# Patient Record
Sex: Female | Born: 1985 | Race: White | Hispanic: No | Marital: Married | Smoking: Current some day smoker
Health system: Southern US, Community
[De-identification: ages and names within clinical notes are randomized; demographics above are authoritative.]

## PROBLEM LIST (undated history)

## (undated) DIAGNOSIS — R112 Nausea with vomiting, unspecified: Secondary | ICD-10-CM

## (undated) DIAGNOSIS — G8929 Other chronic pain: Secondary | ICD-10-CM

## (undated) DIAGNOSIS — T8859XA Other complications of anesthesia, initial encounter: Secondary | ICD-10-CM

## (undated) DIAGNOSIS — R51 Headache: Secondary | ICD-10-CM

## (undated) DIAGNOSIS — M329 Systemic lupus erythematosus, unspecified: Secondary | ICD-10-CM

## (undated) DIAGNOSIS — F419 Anxiety disorder, unspecified: Secondary | ICD-10-CM

## (undated) DIAGNOSIS — Z9889 Other specified postprocedural states: Secondary | ICD-10-CM

## (undated) DIAGNOSIS — Z889 Allergy status to unspecified drugs, medicaments and biological substances status: Secondary | ICD-10-CM

## (undated) DIAGNOSIS — R519 Headache, unspecified: Secondary | ICD-10-CM

## (undated) DIAGNOSIS — E0500 Thyrotoxicosis with diffuse goiter without thyrotoxic crisis or storm: Secondary | ICD-10-CM

## (undated) DIAGNOSIS — J189 Pneumonia, unspecified organism: Secondary | ICD-10-CM

## (undated) DIAGNOSIS — T4145XA Adverse effect of unspecified anesthetic, initial encounter: Secondary | ICD-10-CM

## (undated) DIAGNOSIS — E079 Disorder of thyroid, unspecified: Secondary | ICD-10-CM

## (undated) DIAGNOSIS — I499 Cardiac arrhythmia, unspecified: Secondary | ICD-10-CM

## (undated) DIAGNOSIS — M797 Fibromyalgia: Secondary | ICD-10-CM

## (undated) HISTORY — DX: Systemic lupus erythematosus, unspecified: M32.9

## (undated) HISTORY — DX: Fibromyalgia: M79.7

## (undated) HISTORY — PX: OTHER SURGICAL HISTORY: SHX169

---
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
## 2003-11-03 ENCOUNTER — Other Ambulatory Visit: Admission: RE | Admit: 2003-11-03 | Discharge: 2003-11-03 | Payer: Self-pay | Admitting: Family Medicine

## 2003-11-05 ENCOUNTER — Other Ambulatory Visit: Admission: RE | Admit: 2003-11-05 | Discharge: 2003-11-05 | Payer: Self-pay | Admitting: Family Medicine

## 2004-08-18 ENCOUNTER — Other Ambulatory Visit: Admission: RE | Admit: 2004-08-18 | Discharge: 2004-08-18 | Payer: Self-pay | Admitting: Obstetrics and Gynecology

## 2005-01-03 ENCOUNTER — Other Ambulatory Visit: Admission: RE | Admit: 2005-01-03 | Discharge: 2005-01-03 | Payer: Self-pay | Admitting: Obstetrics and Gynecology

## 2009-07-13 ENCOUNTER — Emergency Department (HOSPITAL_COMMUNITY): Admission: EM | Admit: 2009-07-13 | Discharge: 2009-07-13 | Payer: Self-pay | Admitting: Emergency Medicine

## 2011-09-29 ENCOUNTER — Other Ambulatory Visit (HOSPITAL_COMMUNITY): Payer: Self-pay | Admitting: "Endocrinology

## 2011-09-29 DIAGNOSIS — E0590 Thyrotoxicosis, unspecified without thyrotoxic crisis or storm: Secondary | ICD-10-CM

## 2011-10-03 ENCOUNTER — Encounter (HOSPITAL_COMMUNITY): Payer: Self-pay

## 2011-10-03 ENCOUNTER — Ambulatory Visit (HOSPITAL_COMMUNITY)
Admission: RE | Admit: 2011-10-03 | Discharge: 2011-10-03 | Disposition: A | Discharge status: Home / Self Care | Payer: Medicaid Other | Source: Ambulatory Visit | Attending: "Endocrinology | Admitting: "Endocrinology

## 2011-10-03 DIAGNOSIS — E0500 Thyrotoxicosis with diffuse goiter without thyrotoxic crisis or storm: Secondary | ICD-10-CM | POA: Insufficient documentation

## 2011-10-03 DIAGNOSIS — E0590 Thyrotoxicosis, unspecified without thyrotoxic crisis or storm: Secondary | ICD-10-CM

## 2011-10-03 MED ORDER — SODIUM IODIDE I 131 CAPSULE
20.0000 | Freq: Once | INTRAVENOUS | Status: AC | PRN
  Administered 2011-10-03: 22 via ORAL

## 2011-10-04 ENCOUNTER — Ambulatory Visit (HOSPITAL_COMMUNITY)
Admission: RE | Admit: 2011-10-04 | Discharge: 2011-10-04 | Disposition: A | Discharge status: Home / Self Care | Payer: Medicaid Other | Source: Ambulatory Visit | Attending: "Endocrinology | Admitting: "Endocrinology

## 2011-10-04 ENCOUNTER — Encounter (HOSPITAL_COMMUNITY): Payer: Self-pay

## 2011-10-04 MED ORDER — SODIUM PERTECHNETATE TC 99M INJECTION
10.0000 | Freq: Once | INTRAVENOUS | Status: AC | PRN
  Administered 2011-10-04: 9 via INTRAVENOUS

## 2011-10-05 ENCOUNTER — Other Ambulatory Visit (HOSPITAL_COMMUNITY): Payer: Self-pay | Admitting: "Endocrinology

## 2011-10-05 DIAGNOSIS — E0590 Thyrotoxicosis, unspecified without thyrotoxic crisis or storm: Secondary | ICD-10-CM

## 2011-10-06 ENCOUNTER — Ambulatory Visit (HOSPITAL_COMMUNITY): Payer: Self-pay

## 2011-10-18 ENCOUNTER — Encounter (HOSPITAL_COMMUNITY): Payer: Self-pay | Admitting: Respiratory Therapy

## 2011-10-19 ENCOUNTER — Emergency Department (HOSPITAL_COMMUNITY)
Admission: EM | Admit: 2011-10-19 | Discharge: 2011-10-20 | Disposition: A | Discharge status: Home / Self Care | Payer: Medicaid Other | Attending: Emergency Medicine | Admitting: Emergency Medicine

## 2011-10-19 ENCOUNTER — Encounter (HOSPITAL_COMMUNITY): Payer: Self-pay | Admitting: Emergency Medicine

## 2011-10-19 ENCOUNTER — Emergency Department (HOSPITAL_COMMUNITY): Payer: Medicaid Other

## 2011-10-19 DIAGNOSIS — R1310 Dysphagia, unspecified: Secondary | ICD-10-CM | POA: Insufficient documentation

## 2011-10-19 DIAGNOSIS — R490 Dysphonia: Secondary | ICD-10-CM | POA: Insufficient documentation

## 2011-10-19 DIAGNOSIS — R0602 Shortness of breath: Secondary | ICD-10-CM | POA: Insufficient documentation

## 2011-10-19 DIAGNOSIS — J029 Acute pharyngitis, unspecified: Secondary | ICD-10-CM | POA: Insufficient documentation

## 2011-10-19 DIAGNOSIS — M542 Cervicalgia: Secondary | ICD-10-CM | POA: Insufficient documentation

## 2011-10-19 DIAGNOSIS — E0500 Thyrotoxicosis with diffuse goiter without thyrotoxic crisis or storm: Secondary | ICD-10-CM | POA: Insufficient documentation

## 2011-10-19 DIAGNOSIS — R220 Localized swelling, mass and lump, head: Secondary | ICD-10-CM | POA: Insufficient documentation

## 2011-10-19 DIAGNOSIS — J45909 Unspecified asthma, uncomplicated: Secondary | ICD-10-CM | POA: Insufficient documentation

## 2011-10-19 HISTORY — DX: Thyrotoxicosis with diffuse goiter without thyrotoxic crisis or storm: E05.00

## 2011-10-19 HISTORY — DX: Disorder of thyroid, unspecified: E07.9

## 2011-10-19 LAB — POCT I-STAT, CHEM 8
Creatinine, Ser: 0.6 mg/dL (ref 0.50–1.10)
HCT: 38 % (ref 36.0–46.0)
Hemoglobin: 12.9 g/dL (ref 12.0–15.0)
Sodium: 140 mEq/L (ref 135–145)
TCO2: 25 mmol/L (ref 0–100)

## 2011-10-19 LAB — CBC
HCT: 36.2 % (ref 36.0–46.0)
Hemoglobin: 13.2 g/dL (ref 12.0–15.0)
MCHC: 36.5 g/dL — ABNORMAL HIGH (ref 30.0–36.0)
MCV: 78.2 fL (ref 78.0–100.0)
Platelets: 212 10*3/uL (ref 150–400)
RBC: 4.63 MIL/uL (ref 3.87–5.11)
RDW: 12.7 % (ref 11.5–15.5)
WBC: 11.4 10*3/uL — ABNORMAL HIGH (ref 4.0–10.5)

## 2011-10-19 LAB — CALCIUM: Calcium: 9.7 mg/dL (ref 8.4–10.5)

## 2011-10-19 LAB — DIFFERENTIAL
Basophils Absolute: 0 10*3/uL (ref 0.0–0.1)
Basophils Relative: 0 % (ref 0–1)
Eosinophils Absolute: 0.3 10*3/uL (ref 0.0–0.7)
Neutrophils Relative %: 54 % (ref 43–77)

## 2011-10-19 MED ORDER — SODIUM CHLORIDE 0.9 % IV BOLUS (SEPSIS)
1000.0000 mL | Freq: Once | INTRAVENOUS | Status: AC
  Administered 2011-10-19: 1000 mL via INTRAVENOUS

## 2011-10-19 MED ORDER — MORPHINE SULFATE 4 MG/ML IJ SOLN
4.0000 mg | Freq: Once | INTRAMUSCULAR | Status: AC
  Administered 2011-10-19: 4 mg via INTRAVENOUS
  Filled 2011-10-19: qty 1

## 2011-10-19 MED ORDER — ONDANSETRON HCL 4 MG/2ML IJ SOLN
4.0000 mg | Freq: Once | INTRAMUSCULAR | Status: AC
  Administered 2011-10-19: 4 mg via INTRAVENOUS
  Filled 2011-10-19: qty 2

## 2011-10-19 MED ORDER — IOHEXOL 300 MG/ML  SOLN
100.0000 mL | Freq: Once | INTRAMUSCULAR | Status: AC | PRN
  Administered 2011-10-19: 100 mL via INTRAVENOUS

## 2011-10-19 NOTE — ED Notes (Signed)
Patient with enlarged thyroid.  Having sore throat, shortness of breath.  Patient with history of Grave's Disease.  Surgery in a few weeks.

## 2011-10-19 NOTE — ED Notes (Signed)
Pt c/o sob, sore throat, and increase pain w/swallowing. Pt states "it feels like I'm swallowing glass when I swallow." Pt is scheduled to have her thyroid gland d/t graves disease, Dr. Marilynne Drivers sent pt here today d/t pain

## 2011-10-19 NOTE — ED Provider Notes (Signed)
History     CSN: [id number]  Arrival date & time 10/19/11  2026   First MD Initiated Contact with Patient 10/19/11 2217      Chief Complaint  Patient presents with  . Sore Throat  . Shortness of Breath    (Consider location/radiation/quality/duration/timing/severity/associated sxs/prior treatment) HPI Comments: Valerie Ayala is a 26 year old female, who is scheduled for a thyroidectomy, do, to severe Graves' disease, now reports 3 days of increasing visible neck swelling, difficulty swallowing, hoarseness, and shortness of breath.  She spoke with her doctor, Dr. Cleon Gustin- recommend she come to the emergency room for further evaluation  Patient is a 26 y.o. female presenting with pharyngitis and shortness of breath. The history is provided by the patient.  Sore Throat This is a new problem. The current episode started in the past 7 days. The problem occurs constantly. The problem has been rapidly worsening. Associated symptoms include neck pain and a sore throat. Pertinent negatives include no chills or fever.  Shortness of Breath  Associated symptoms include sore throat and shortness of breath. Pertinent negatives include no fever.    Past Medical History  Diagnosis Date  . Asthma   . Thyroid disease   . Grave's disease     History reviewed. No pertinent past surgical history.  No family history on file.  History  Substance Use Topics  . Smoking status: Former Games developer  . Smokeless tobacco: Not on file  . Alcohol Use: No    OB History    Grav Para Term Preterm Abortions TAB SAB Ect Mult Living                  Review of Systems  Constitutional: Negative for fever and chills.  HENT: Positive for sore throat, trouble swallowing, neck pain and voice change. Negative for facial swelling and neck stiffness.   Respiratory: Positive for shortness of breath.     Allergies  Review of patient's allergies indicates no known allergies.  Home Medications   Current Outpatient Rx    Name Route Sig Dispense Refill  . ALBUTEROL SULFATE HFA 108 (90 BASE) MCG/ACT IN AERS Inhalation Inhale 2 puffs into the lungs every 6 (six) hours as needed. For shortness of breath    . ALPRAZOLAM 0.5 MG PO TABS Oral Take 0.5 mg by mouth 2 (two) times daily as needed. For anxiety    . IBUPROFEN 200 MG PO TABS Oral Take 400 mg by mouth every 6 (six) hours as needed. For pain    . METHIMAZOLE 5 MG PO TABS Oral Take 5 mg by mouth daily.    Marland Kitchen METOPROLOL SUCCINATE ER 50 MG PO TB24 Oral Take 50 mg by mouth daily. Take with or immediately following a meal.    . AZITHROMYCIN 250 MG PO TABS  1 tablet daily 4 each 0  . AZITHROMYCIN 250 MG PO TABS Oral Take 1 tablet (250 mg total) by mouth daily. 4 tablet 0  . HYDROCODONE-ACETAMINOPHEN 5-325 MG PO TABS Oral Take 1 tablet by mouth once. 20 tablet 0    BP 124/62  Pulse 95  Temp(Src) 98.8 F (37.1 C) (Oral)  Resp 20  SpO2 100%  LMP 10/03/2011  Physical Exam  Constitutional: She is oriented to person, place, and time. She appears well-developed and well-nourished.  HENT:  Head: Normocephalic.  Eyes: Pupils are equal, round, and reactive to light.  Neck: No JVD present. No tracheal deviation present. Thyromegaly present.  Cardiovascular: Normal rate and regular rhythm.   Pulmonary/Chest: Effort  normal. No stridor.  Musculoskeletal: Normal range of motion.  Lymphadenopathy:    She has no cervical adenopathy.  Neurological: She is alert and oriented to person, place, and time.  Skin: Skin is warm and dry.    ED Course  Procedures (including critical care time)  Labs Reviewed  CBC - Abnormal; Notable for the following:    WBC 11.4 (*)    MCHC 36.5 (*)    All other components within normal limits  DIFFERENTIAL - Abnormal; Notable for the following:    Lymphs Abs 4.1 (*)    All other components within normal limits  CALCIUM  POCT I-STAT, CHEM 8  RAPID STREP SCREEN   Ct Soft Tissue Neck W Contrast  10/19/2011  *RADIOLOGY REPORT*   Clinical Data: Swollen neck.  Difficulty swallowing.  Unable to follow.  Sore throat.  CT NECK WITH CONTRAST  Technique:  Multidetector CT imaging of the neck was performed with intravenous contrast.  Contrast: OMNIPAQUE IOHEXOL 300 MG/ML  SOLN  Comparison: None.  Findings: Diffuse thyroid enlargement with homogeneous parenchymal pattern.  Cervical airways appear patent.  Moderately prominent size of the tonsils bilaterally suggesting possible inflammatory process.  No focal tonsillar or peritonsillar abscess.  Mucosal spaces appear intact.  Cervical carotid and jugular vessels appear patent.  No displacement of cervical fat planes.  No prevertebral soft tissue swelling.  Prominent cervical lymph nodes diffusely consistent with reactive nodes.  Salivary glands appear symmetrical.  Visualized bones appear intact.  No destructive bone lesions appreciated.  Mucosal membrane thickening in the maxillary antra with air-fluid level in the right maxillary antrum and opacification of the ethmoid air cells and inferior frontal sinuses, likely due to sinusitis.  IMPRESSION: Diffuse homogeneous enlargement of the thyroid without apparent airway compromise.  Inflammatory changes in the paranasal sinuses. Diffuse swelling of the tonsils suggesting inflammatory process without focal abscess.  Original Report Authenticated By: Marlon Pel, M.D.     1. Pharyngitis       MDM  Concern for airway compromise discuss this case with Dr. Emily Filbert , who agrees the patient needs a CT scan also added.  CBC i-STAT, and calcium levels        Arman Filter, NP 10/20/11 [phone]

## 2011-10-19 NOTE — ED Notes (Addendum)
Alert, NAD, calm, interactive, back from b/r, ambulatory, steady gait, friend at [facility], c/o throat, head and ear pain. Pain med given. Pt talkative with hoarse voice.

## 2011-10-20 LAB — RAPID STREP SCREEN (MED CTR MEBANE ONLY): Streptococcus, Group A Screen (Direct): NEGATIVE

## 2011-10-20 MED ORDER — AZITHROMYCIN 250 MG PO TABS: ORAL_TABLET | ORAL | Status: AC

## 2011-10-20 MED ORDER — AZITHROMYCIN 250 MG PO TABS: 250.0000 mg | ORAL_TABLET | Freq: Every day | ORAL | Status: AC

## 2011-10-20 MED ORDER — HYDROCODONE-ACETAMINOPHEN 5-325 MG PO TABS
1.0000 | ORAL_TABLET | Freq: Once | ORAL | Status: AC
  Administered 2011-10-20: 1 via ORAL
  Filled 2011-10-20: qty 1

## 2011-10-20 MED ORDER — AZITHROMYCIN 250 MG PO TABS
500.0000 mg | ORAL_TABLET | Freq: Once | ORAL | Status: AC
  Administered 2011-10-20: 500 mg via ORAL
  Filled 2011-10-20: qty 2

## 2011-10-20 MED ORDER — HYDROCODONE-ACETAMINOPHEN 5-325 MG PO TABS: 1.0000 | ORAL_TABLET | Freq: Once | ORAL | Status: AC

## 2011-10-20 NOTE — Discharge Instructions (Signed)
Pharyngitis, Viral and Bacterial Pharyngitis is soreness (inflammation) or infection of the pharynx. It is also called a sore throat. CAUSES  Most sore throats are caused by viruses and are part of a cold. However, some sore throats are caused by strep and other bacteria. Sore throats can also be caused by post nasal drip from draining sinuses, allergies and sometimes from sleeping with an open mouth. Infectious sore throats can be spread from person to person by coughing, sneezing and sharing cups or eating utensils. TREATMENT  Sore throats that are viral usually last 3-4 days. Viral illness will get better without medications (antibiotics). Strep throat and other bacterial infections will usually begin to get better about 24-48 hours after you begin to take antibiotics. HOME CARE INSTRUCTIONS   If the caregiver feels there is a bacterial infection or if there is a positive strep test, they will prescribe an antibiotic. The full course of antibiotics must be taken. If the full course of antibiotic is not taken, you or your child may become ill again. If you or your child has strep throat and do not finish all of the medication, serious heart or kidney diseases may develop.   Drink enough water and fluids to keep your urine clear or pale yellow.   Only take over-the-counter or prescription medicines for pain, discomfort or fever as directed by your caregiver.   Get lots of rest.   Gargle with salt water ( tsp. of salt in a glass of water) as often as every 1-2 hours as you need for comfort.   Hard candies may soothe the throat if individual is not at risk for choking. Throat sprays or lozenges may also be used.  SEEK MEDICAL CARE IF:   Large, tender lumps in the neck develop.   A rash develops.   Green, yellow-brown or bloody sputum is coughed up.   Your baby is older than 3 months with a rectal temperature of 100.5 F (38.1 C) or higher for more than 1 day.  SEEK IMMEDIATE MEDICAL CARE  IF:   A stiff neck develops.   You or your child are drooling or unable to swallow liquids.   You or your child are vomiting, unable to keep medications or liquids down.   You or your child has severe pain, unrelieved with recommended medications.   You or your child are having difficulty breathing (not due to stuffy nose).   You or your child are unable to fully open your mouth.   You or your child develop redness, swelling, or severe pain anywhere on the neck.   You have a fever.   Your baby is older than 3 months with a rectal temperature of 102 F (38.9 C) or higher.   Your baby is 71 months old or younger with a rectal temperature of 100.4 F (38 C) or higher.  MAKE SURE YOU:   Understand these instructions.   Will watch your condition.   Will get help right away if you are not doing well or get worse.  Document Released: 06/13/2005 Document Revised: 06/02/2011 Document Reviewed: 09/10/2007 [facility] Patient Information 2012 [place], [place]. Today's CT scan is normal.  The size of your thyroid has not changed her airway is intact.  He did have some increased/inflamed tonsillar tissue with a negative strep test are being treated with an antibiotic called azithromycin and you have been given Vicodin for pain.  Please call your endocrinologist tomorrow to let him know you were in the emergency room  she can proceed with her surgery

## 2011-10-20 NOTE — ED Notes (Signed)
Up to b/r, steady gait, talkative, alert, NAD, calm.

## 2011-10-23 NOTE — ED Provider Notes (Signed)
Medical screening examination/treatment/procedure(s) were performed by non-physician practitioner and as supervising physician I was immediately available for consultation/collaboration.  Shironda Kain, MD 10/23/11 1703 

## 2011-10-27 NOTE — H&P (Signed)
  Valerie Ayala is an 26 y.o. female.   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  Past Medical History  Diagnosis Date  . Asthma   . Thyroid disease   . Grave's disease     No past surgical history on file.  No family history on file. Social History:  reports that she has quit smoking. She does not have any smokeless tobacco history on file. She reports that she does not drink alcohol or use illicit drugs.  Allergies: No Known Allergies  No prescriptions prior to admission    No results found for this or any previous visit (from the past 48 hour(s)). No results found.  ROS: otherwise negative  There were no vitals taken for this visit.  PHYSICAL EXAM: Overall appearance:  Healthy appearing, in no distress Head:  Normocephalic, atraumatic. Ears: External auditory canals are clear; tympanic membranes are intact and the middle ears are free of any effusion. Nose: External nose is healthy in appearance. Internal nasal exam free of any lesions or obstruction. Oral Cavity:  There are no mucosal lesions or masses identified. Oral Pharynx/Hypopharynx/Larynx: no signs of any mucosal lesions or masses identified. Vocal cords move normally. Neuro:  No identifiable neurologic deficits. Neck: No palpable neck masses. Thyroid mildly enlarged, slightly tender.  Studies Reviewed: none    Assessment/Plan Recommend total thyroidectomy.  Nixie Laube 10/27/2011, 1:00 PM

## 2011-10-27 NOTE — Pre-Procedure Instructions (Signed)
20 Valerie Ayala  10/27/2011   Your procedure is scheduled on:  Nov 02, 2011 @ 1000  Report to [facility] at 0800 AM.  Call this number if you have problems the morning of surgery: [phone]   Remember:   Do not eat food:After Midnight.  May have clear liquids: up to 4 Hours before arrival.  Clear liquids include soda, tea, black coffee, apple or grape juice, broth.  Take these medicines the morning of surgery with A SIP OF WATER: Inhalers (if needed), xanax, Norco (if needed), Toprol   Do not wear jewelry, make-up or nail polish.  Do not wear lotions, powders, or perfumes.   Do not shave 48 hours prior to surgery.  Do not bring valuables to the hospital.  Contacts, dentures or bridgework may not be worn into surgery.  Leave suitcase in the car. After surgery it may be brought to your room.  For patients admitted to the hospital, checkout time is 11:00 AM the day of discharge.   Patients discharged the day of surgery will not be allowed to drive home.  Special Instructions: CHG Shower Use Special Wash: 1/2 bottle night before surgery and 1/2 bottle morning of surgery.   Please read over the following fact sheets that you were given: Pain Booklet, Coughing and Deep Breathing, MRSA Information and Surgical Site Infection Prevention

## 2011-10-28 ENCOUNTER — Ambulatory Visit (HOSPITAL_COMMUNITY)
Admission: RE | Admit: 2011-10-28 | Discharge: 2011-10-28 | Disposition: A | Discharge status: Home / Self Care | Payer: Medicaid Other | Source: Ambulatory Visit | Attending: Anesthesiology | Admitting: Anesthesiology

## 2011-10-28 ENCOUNTER — Encounter (HOSPITAL_COMMUNITY)
Admission: RE | Admit: 2011-10-28 | Discharge: 2011-10-28 | Disposition: A | Discharge status: Home / Self Care | Payer: Medicaid Other | Source: Ambulatory Visit | Attending: Otolaryngology | Admitting: Otolaryngology

## 2011-10-28 ENCOUNTER — Encounter (HOSPITAL_COMMUNITY): Payer: Self-pay

## 2011-10-28 DIAGNOSIS — Z01818 Encounter for other preprocedural examination: Secondary | ICD-10-CM | POA: Insufficient documentation

## 2011-10-28 DIAGNOSIS — Z01812 Encounter for preprocedural laboratory examination: Secondary | ICD-10-CM | POA: Insufficient documentation

## 2011-10-28 HISTORY — DX: Headache: R51

## 2011-10-28 HISTORY — DX: Cardiac arrhythmia, unspecified: I49.9

## 2011-10-28 HISTORY — DX: Allergy status to unspecified drugs, medicaments and biological substances: Z88.9

## 2011-10-28 HISTORY — DX: Anxiety disorder, unspecified: F41.9

## 2011-10-28 LAB — HCG, SERUM, QUALITATIVE: Preg, Serum: NEGATIVE

## 2011-10-28 LAB — CBC
Hemoglobin: 13.5 g/dL (ref 12.0–15.0)
MCH: 28.3 pg (ref 26.0–34.0)
MCHC: 36.1 g/dL — ABNORMAL HIGH (ref 30.0–36.0)

## 2011-10-28 LAB — SURGICAL PCR SCREEN: MRSA, PCR: NEGATIVE

## 2011-10-28 NOTE — Progress Notes (Signed)
Primary Physician - Dr. Margo Aye [facility] Patient does not have cardiologist. Has not had EKG, Echo, Stress test or cardiac Cath

## 2011-11-01 MED ORDER — CEFAZOLIN SODIUM-DEXTROSE 2-3 GM-% IV SOLR
2.0000 g | INTRAVENOUS | Status: AC
  Administered 2011-11-02: 2 g via INTRAVENOUS
  Filled 2011-11-01 (×2): qty 50

## 2011-11-02 ENCOUNTER — Encounter (HOSPITAL_COMMUNITY): Payer: Self-pay | Admitting: *Deleted

## 2011-11-02 ENCOUNTER — Encounter (HOSPITAL_COMMUNITY): Payer: Self-pay | Admitting: Anesthesiology

## 2011-11-02 ENCOUNTER — Ambulatory Visit (HOSPITAL_COMMUNITY)
Admission: RE | Admit: 2011-11-02 | Discharge: 2011-11-03 | Disposition: A | Discharge status: Home / Self Care | Payer: Medicaid Other | Source: Ambulatory Visit | Attending: Otolaryngology | Admitting: Otolaryngology

## 2011-11-02 ENCOUNTER — Encounter (HOSPITAL_COMMUNITY)
Admission: RE | Disposition: A | Discharge status: Home / Self Care | Payer: Self-pay | Source: Ambulatory Visit | Attending: Otolaryngology

## 2011-11-02 ENCOUNTER — Ambulatory Visit (HOSPITAL_COMMUNITY): Payer: Medicaid Other | Admitting: Anesthesiology

## 2011-11-02 DIAGNOSIS — J189 Pneumonia, unspecified organism: Secondary | ICD-10-CM

## 2011-11-02 DIAGNOSIS — E0500 Thyrotoxicosis with diffuse goiter without thyrotoxic crisis or storm: Secondary | ICD-10-CM | POA: Insufficient documentation

## 2011-11-02 DIAGNOSIS — Z23 Encounter for immunization: Secondary | ICD-10-CM | POA: Insufficient documentation

## 2011-11-02 HISTORY — DX: Other chronic pain: G89.29

## 2011-11-02 HISTORY — DX: Adverse effect of unspecified anesthetic, initial encounter: T41.45XA

## 2011-11-02 HISTORY — DX: Headache, unspecified: R51.9

## 2011-11-02 HISTORY — DX: Other complications of anesthesia, initial encounter: T88.59XA

## 2011-11-02 HISTORY — PX: TOTAL THYROIDECTOMY: SHX2547

## 2011-11-02 HISTORY — DX: Other specified postprocedural states: R11.2

## 2011-11-02 HISTORY — DX: Pneumonia, unspecified organism: J18.9

## 2011-11-02 HISTORY — DX: Headache: R51

## 2011-11-02 HISTORY — PX: THYROIDECTOMY: SHX17

## 2011-11-02 HISTORY — DX: Other specified postprocedural states: Z98.890

## 2011-11-02 LAB — CALCIUM
Calcium: 8.5 mg/dL (ref 8.4–10.5)
Calcium: 8.3 mg/dL — ABNORMAL LOW (ref 8.4–10.5)

## 2011-11-02 SURGERY — THYROIDECTOMY
Anesthesia: General | Site: Neck | Laterality: Bilateral | Wound class: Clean

## 2011-11-02 MED ORDER — METHIMAZOLE 5 MG PO TABS
5.0000 mg | ORAL_TABLET | Freq: Every day | ORAL | Status: DC
  Administered 2011-11-03: 5 mg via ORAL
  Filled 2011-11-02 (×2): qty 1

## 2011-11-02 MED ORDER — PROMETHAZINE HCL 25 MG PO TABS
25.0000 mg | ORAL_TABLET | Freq: Four times a day (QID) | ORAL | Status: DC | PRN
  Administered 2011-11-02: 25 mg via ORAL
  Filled 2011-11-02: qty 1

## 2011-11-02 MED ORDER — DEXTROSE 5 % IV SOLN
INTRAVENOUS | Status: DC | PRN
  Administered 2011-11-02: 11:00:00 via INTRAVENOUS

## 2011-11-02 MED ORDER — DEXTROSE-NACL 5-0.9 % IV SOLN
INTRAVENOUS | Status: DC
  Administered 2011-11-02 – 2011-11-03 (×2): via INTRAVENOUS

## 2011-11-02 MED ORDER — LACTATED RINGERS IV SOLN: INTRAVENOUS | Status: DC

## 2011-11-02 MED ORDER — ONDANSETRON HCL 4 MG/2ML IJ SOLN
INTRAMUSCULAR | Status: DC | PRN
  Administered 2011-11-02: 4 mg via INTRAVENOUS

## 2011-11-02 MED ORDER — MORPHINE SULFATE 2 MG/ML IJ SOLN
2.0000 mg | INTRAMUSCULAR | Status: DC | PRN
  Administered 2011-11-02: 2 mg via INTRAVENOUS
  Filled 2011-11-02: qty 1

## 2011-11-02 MED ORDER — HYDROCODONE-ACETAMINOPHEN 7.5-500 MG PO TABS: 1.0000 | ORAL_TABLET | Freq: Four times a day (QID) | ORAL | Status: AC | PRN

## 2011-11-02 MED ORDER — PNEUMOCOCCAL VAC POLYVALENT 25 MCG/0.5ML IJ INJ
0.5000 mL | INJECTION | INTRAMUSCULAR | Status: AC
  Administered 2011-11-03: 0.5 mL via INTRAMUSCULAR
  Filled 2011-11-02: qty 0.5

## 2011-11-02 MED ORDER — LACTATED RINGERS IV SOLN
INTRAVENOUS | Status: DC
  Administered 2011-11-02: 10:00:00 via INTRAVENOUS

## 2011-11-02 MED ORDER — 0.9 % SODIUM CHLORIDE (POUR BTL) OPTIME
TOPICAL | Status: DC | PRN
  Administered 2011-11-02: 1000 mL

## 2011-11-02 MED ORDER — HYDROMORPHONE HCL PF 1 MG/ML IJ SOLN
0.2500 mg | INTRAMUSCULAR | Status: DC | PRN
  Administered 2011-11-02 (×2): 0.5 mg via INTRAVENOUS

## 2011-11-02 MED ORDER — SUCCINYLCHOLINE CHLORIDE 20 MG/ML IJ SOLN
INTRAMUSCULAR | Status: DC | PRN
  Administered 2011-11-02: 100 mg via INTRAVENOUS

## 2011-11-02 MED ORDER — LIDOCAINE HCL 4 % MT SOLN
OROMUCOSAL | Status: DC | PRN
  Administered 2011-11-02: 4 mL via TOPICAL

## 2011-11-02 MED ORDER — LACTATED RINGERS IV SOLN
INTRAVENOUS | Status: DC | PRN
  Administered 2011-11-02 (×2): via INTRAVENOUS

## 2011-11-02 MED ORDER — IBUPROFEN 400 MG PO TABS
400.0000 mg | ORAL_TABLET | Freq: Four times a day (QID) | ORAL | Status: DC | PRN
  Filled 2011-11-02: qty 1

## 2011-11-02 MED ORDER — PROMETHAZINE HCL 25 MG RE SUPP: 25.0000 mg | Freq: Four times a day (QID) | RECTAL | Status: DC | PRN

## 2011-11-02 MED ORDER — METOPROLOL SUCCINATE ER 50 MG PO TB24
50.0000 mg | ORAL_TABLET | Freq: Every day | ORAL | Status: DC
  Administered 2011-11-03: 50 mg via ORAL
  Filled 2011-11-02: qty 1

## 2011-11-02 MED ORDER — ALPRAZOLAM 0.5 MG PO TABS: 0.5000 mg | ORAL_TABLET | Freq: Two times a day (BID) | ORAL | Status: DC | PRN

## 2011-11-02 MED ORDER — DEXAMETHASONE SODIUM PHOSPHATE 4 MG/ML IJ SOLN
INTRAMUSCULAR | Status: DC | PRN
  Administered 2011-11-02: 8 mg via INTRAVENOUS

## 2011-11-02 MED ORDER — ALBUTEROL SULFATE HFA 108 (90 BASE) MCG/ACT IN AERS
2.0000 | INHALATION_SPRAY | Freq: Four times a day (QID) | RESPIRATORY_TRACT | Status: DC | PRN
  Filled 2011-11-02: qty 6.7

## 2011-11-02 MED ORDER — HETASTARCH-ELECTROLYTES 6 % IV SOLN
INTRAVENOUS | Status: DC | PRN
  Administered 2011-11-02: 11:00:00 via INTRAVENOUS

## 2011-11-02 MED ORDER — SUFENTANIL CITRATE 50 MCG/ML IV SOLN
INTRAVENOUS | Status: DC | PRN
  Administered 2011-11-02: 10 ug via INTRAVENOUS
  Administered 2011-11-02 (×2): 5 ug via INTRAVENOUS
  Administered 2011-11-02: 10 ug via INTRAVENOUS
  Administered 2011-11-02 (×4): 5 ug via INTRAVENOUS

## 2011-11-02 MED ORDER — MORPHINE SULFATE 4 MG/ML IJ SOLN: 0.0500 mg/kg | INTRAMUSCULAR | Status: DC | PRN

## 2011-11-02 MED ORDER — MIDAZOLAM HCL 5 MG/5ML IJ SOLN
INTRAMUSCULAR | Status: DC | PRN
  Administered 2011-11-02: 2 mg via INTRAVENOUS

## 2011-11-02 MED ORDER — METOCLOPRAMIDE HCL 5 MG/ML IJ SOLN
10.0000 mg | INTRAMUSCULAR | Status: AC
  Administered 2011-11-02: 10 mg via INTRAVENOUS
  Filled 2011-11-02: qty 2

## 2011-11-02 MED ORDER — TRAMADOL HCL 50 MG PO TABS: 50.0000 mg | ORAL_TABLET | Freq: Four times a day (QID) | ORAL | Status: DC | PRN

## 2011-11-02 MED ORDER — LIDOCAINE-EPINEPHRINE 1 %-1:100000 IJ SOLN
INTRAMUSCULAR | Status: DC | PRN
  Administered 2011-11-02: 20 mL

## 2011-11-02 MED ORDER — TRAMADOL HCL 50 MG PO TABS: 50.0000 mg | ORAL_TABLET | Freq: Four times a day (QID) | ORAL | Status: AC | PRN

## 2011-11-02 MED ORDER — HYDROCODONE-ACETAMINOPHEN 5-325 MG PO TABS
1.0000 | ORAL_TABLET | ORAL | Status: DC | PRN
  Filled 2011-11-02: qty 2

## 2011-11-02 MED ORDER — HYDROCODONE-ACETAMINOPHEN 7.5-500 MG/15ML PO SOLN
10.0000 mL | ORAL | Status: DC | PRN
  Administered 2011-11-02: 15 mL via ORAL
  Administered 2011-11-03: 20 mL via ORAL
  Filled 2011-11-02 (×2): qty 30

## 2011-11-02 MED ORDER — PROPOFOL 10 MG/ML IV EMUL
INTRAVENOUS | Status: DC | PRN
  Administered 2011-11-02: 180 mg via INTRAVENOUS

## 2011-11-02 SURGICAL SUPPLY — 48 items
APPLIER CLIP 9.375 SM OPEN (CLIP)
ATTRACTOMAT 16X20 MAGNETIC DRP (DRAPES) IMPLANT
CANISTER SUCTION 2500CC (MISCELLANEOUS) ×2 IMPLANT
CLEANER TIP ELECTROSURG 2X2 (MISCELLANEOUS) ×2 IMPLANT
CLIP APPLIE 9.375 SM OPEN (CLIP) IMPLANT
CLOTH BEACON ORANGE TIMEOUT ST (SAFETY) ×2 IMPLANT
CONT SPEC 4OZ CLIKSEAL STRL BL (MISCELLANEOUS) ×2 IMPLANT
CORDS BIPOLAR (ELECTRODE) ×2 IMPLANT
COVER SURGICAL LIGHT HANDLE (MISCELLANEOUS) ×2 IMPLANT
DECANTER SPIKE VIAL GLASS SM (MISCELLANEOUS) ×2 IMPLANT
DERMABOND ADVANCED (GAUZE/BANDAGES/DRESSINGS) ×2
DERMABOND ADVANCED .7 DNX12 (GAUZE/BANDAGES/DRESSINGS) ×2 IMPLANT
DRAIN SNY 10 ROU (WOUND CARE) ×2 IMPLANT
ELECT COATED BLADE 2.86 ST (ELECTRODE) ×2 IMPLANT
ELECT REM PT RETURN 9FT ADLT (ELECTROSURGICAL) ×2
ELECTRODE REM PT RTRN 9FT ADLT (ELECTROSURGICAL) ×1 IMPLANT
EVACUATOR SILICONE 100CC (DRAIN) ×2 IMPLANT
GAUZE SPONGE 4X4 16PLY XRAY LF (GAUZE/BANDAGES/DRESSINGS) ×6 IMPLANT
GLOVE BIO SURGEON STRL SZ 6.5 (GLOVE) ×4 IMPLANT
GLOVE BIO SURGEON STRL SZ8 (GLOVE) ×2 IMPLANT
GLOVE BIOGEL PI IND STRL 6.5 (GLOVE) ×1 IMPLANT
GLOVE BIOGEL PI IND STRL 8 (GLOVE) ×1 IMPLANT
GLOVE BIOGEL PI INDICATOR 6.5 (GLOVE) ×1
GLOVE BIOGEL PI INDICATOR 8 (GLOVE) ×1
GLOVE ECLIPSE 7.5 STRL STRAW (GLOVE) ×2 IMPLANT
GLOVE EXAM NITRILE LRG STRL (GLOVE) ×2 IMPLANT
GOWN PREVENTION PLUS XLARGE (GOWN DISPOSABLE) ×2 IMPLANT
GOWN STRL NON-REIN LRG LVL3 (GOWN DISPOSABLE) ×6 IMPLANT
KIT BASIN OR (CUSTOM PROCEDURE TRAY) ×2 IMPLANT
KIT ROOM TURNOVER OR (KITS) ×2 IMPLANT
NEEDLE 27GAX1X1/2 (NEEDLE) ×2 IMPLANT
NS IRRIG 1000ML POUR BTL (IV SOLUTION) ×2 IMPLANT
PAD ARMBOARD 7.5X6 YLW CONV (MISCELLANEOUS) ×4 IMPLANT
PENCIL FOOT CONTROL (ELECTRODE) ×2 IMPLANT
SPECIMEN JAR MEDIUM (MISCELLANEOUS) ×2 IMPLANT
SPONGE INTESTINAL PEANUT (DISPOSABLE) IMPLANT
STAPLER VISISTAT 35W (STAPLE) ×2 IMPLANT
SUT CHROMIC 3 0 SH 27 (SUTURE) ×2 IMPLANT
SUT CHROMIC 4 0 PS 2 18 (SUTURE) ×4 IMPLANT
SUT ETHILON 3 0 PS 1 (SUTURE) ×2 IMPLANT
SUT ETHILON 5 0 P 3 18 (SUTURE) ×2
SUT NYLON ETHILON 5-0 P-3 1X18 (SUTURE) ×2 IMPLANT
SUT SILK 3 0 REEL (SUTURE) IMPLANT
SUT SILK 4 0 REEL (SUTURE) ×4 IMPLANT
TOWEL OR 17X24 6PK STRL BLUE (TOWEL DISPOSABLE) ×2 IMPLANT
TOWEL OR 17X26 10 PK STRL BLUE (TOWEL DISPOSABLE) ×2 IMPLANT
TRAY ENT MC OR (CUSTOM PROCEDURE TRAY) ×2 IMPLANT
WATER STERILE IRR 1000ML POUR (IV SOLUTION) ×2 IMPLANT

## 2011-11-02 NOTE — Anesthesia Procedure Notes (Signed)
Procedure Name: Intubation Date/Time: 11/02/2011 10:27 AM Performed by: Mekayla Soman S Pre-anesthesia Checklist: Patient identified, Emergency Drugs available, Suction available, Patient being monitored and Timeout performed Patient Re-evaluated:Patient Re-evaluated prior to inductionOxygen Delivery Method: Circle system utilized Preoxygenation: Pre-oxygenation with 100% oxygen Intubation Type: IV induction Ventilation: Mask ventilation without difficulty Laryngoscope Size: Mac and 3 Grade View: Grade I Tube type: Oral Tube size: 7.5 mm Number of attempts: 1 Airway Equipment and Method: Stylet Placement Confirmation: ETT inserted through vocal cords under direct vision,  positive ETCO2 and breath sounds checked- equal and bilateral Secured at: 22 cm Tube secured with: Tape Dental Injury: Teeth and Oropharynx as per pre-operative assessment

## 2011-11-02 NOTE — Transfer of Care (Signed)
Immediate Anesthesia Transfer of Care Note  Patient: Valerie Ayala  Procedure(s) Performed: Procedure(s) (LRB): THYROIDECTOMY (Bilateral)  Patient Location: PACU  Anesthesia Type: General  Level of Consciousness: awake, alert  and oriented  Airway & Oxygen Therapy: Patient Spontanous Breathing and Patient connected to nasal cannula oxygen  Post-op Assessment: Report given to PACU RN and Post -op Vital signs reviewed and stable  Post vital signs: Reviewed and stable  Complications: No apparent anesthesia complications

## 2011-11-02 NOTE — Anesthesia Preprocedure Evaluation (Addendum)
Anesthesia Evaluation  Patient identified by MRN, date of birth, ID band Patient awake    Reviewed: Allergy & Precautions, H&P , NPO status , Patient's Chart, lab work & pertinent test results, reviewed documented beta blocker date and time   Airway Mallampati: I  Neck ROM: Full    Dental  (+) Teeth Intact and Dental Advisory Given   Pulmonary asthma ,  breath sounds clear to auscultation        Cardiovascular negative cardio ROS  + dysrhythmias Rhythm:Regular Rate:Normal     Neuro/Psych  Headaches, Anxiety    GI/Hepatic negative GI ROS, Neg liver ROS,   Endo/Other  Hyperthyroidism   Renal/GU negative Renal ROS     Musculoskeletal negative musculoskeletal ROS (+)   Abdominal   Peds  Hematology negative hematology ROS (+)   Anesthesia Other Findings   Reproductive/Obstetrics negative OB ROS                        Anesthesia Physical Anesthesia Plan  ASA: II  Anesthesia Plan: General   Post-op Pain Management:    Induction: Intravenous  Airway Management Planned: Oral ETT  Additional Equipment:   Intra-op Plan:   Post-operative Plan: Extubation in OR  Informed Consent: I have reviewed the patients History and Physical, chart, labs and discussed the procedure including the risks, benefits and alternatives for the proposed anesthesia with the patient or authorized representative who has indicated his/her understanding and acceptance.   Dental advisory given  Plan Discussed with: CRNA, Anesthesiologist and Surgeon  Anesthesia Plan Comments:        Anesthesia Quick Evaluation

## 2011-11-02 NOTE — Progress Notes (Signed)
   ENT Progress Note:  s/p Procedure(s): THYROIDECTOMY   Subjective: Pt c/o mild nausea  Objective: Vital signs in last 24 hours: Temp:  [97.9 F (36.6 C)-99.1 F (37.3 C)] 98 F (36.7 C) (05/08 1502) Pulse Rate:  [66-134] 109  (05/08 1502) Resp:  [14-24] 20  (05/08 1502) BP: (126-140)/(64-74) 131/73 mmHg (05/08 1502) SpO2:  [97 %-100 %] 99 % (05/08 1502) Weight change:     Intake/Output from previous day:   Intake/Output this shift: Total I/O In: 1550 [I.V.:1050; IV Piggyback:500] Out: 70 [Drains:20; Blood:50]  Labs:  Studies/Results: No results found.   PHYSICAL EXAM: Inc intact, no swelling JP min   Assessment/Plan: Cont postop obs. Monitor drain output and Ca+2 levels    Valerie Ayala 11/02/2011, 4:34 PM

## 2011-11-02 NOTE — Discharge Instructions (Addendum)
You may shower, and it is okay to get the incision wet and soapy. Do not use any cream or ointment.  The prescription for calcitriol has been called in to your pharmacy.

## 2011-11-02 NOTE — Preoperative (Signed)
Beta Blockers   Reason not to administer Beta Blockers:Not Applicable, taken this am per patient 

## 2011-11-02 NOTE — Op Note (Signed)
OPERATIVE REPORT  DATE OF SURGERY: 11/02/2011  PATIENT:  Valerie Ayala,  26 y.o. female  PRE-OPERATIVE DIAGNOSIS:  Graves disease   POST-OPERATIVE DIAGNOSIS:  Graves disease   PROCEDURE:  Procedure(s): THYROIDECTOMY  SURGEON:  Susy Frizzle, MD  ASSISTANTS: Beckey Rutter, PA   ANESTHESIA:   general  EBL:  50 ml  DRAINS: (10 [place]) Jackson-Pratt drain(s) with closed bulb suction in the Neck   LOCAL MEDICATIONS USED:  NONE  SPECIMEN:  Source of Specimen:  Total thyroidectomy  COUNTS:  YES  PROCEDURE DETAILS: Patient was taken to the operating room and placed on the operating table in the supine position. All induction of general endotracheal anesthesia, the neck was prepped and draped in a standard fashion. A shoulder roll was placed beneath the shoulder blades and the neck was extended . A low transverse incision was outlined in a skin crease with a marking pen. Centimeter length was used. Electrocautery was used to incise the skin and subcutaneous tissue. The platysma was divided. Subplatysmal flaps were elevated superiorly to the thyroid notch and inferiorly to the clavicle. A self-retaining thyroid retractor was used throughout the case. The midline fascia was divided using electrocautery. The thyroid gland was identified. The left side was dissected first. The strap muscles were reflected laterally off the gland towards the left. There was some stickiness of the strap muscles to the capsule of the gland. The gland was reflected medially as the dissection was carried around the gland starting at the upper pole. Vascular structures were identified, ligated between clamps and divided. 4-0 silk ties were used throughout the case. As the superior pole was brought down inferiorly , the middle thyroid vein was then ligated and divided.  Inferior pole vessels were treated in a similar fashion. The recurrent nerve was identified and followed all the way up towards its insertion into the  larynx. It was preserved. A parathyroid was not identified on the left. On the right side a similar dissection was accomplished, and a possible parathyroid was identified superiorly. This was preserved with its blood supply. The recurrent nerve was identified on the right side as well and preserved. No other nodes or masses were identified. 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 The specimen was sent as a total thyroidectomy with a suture marking the left lobe. After hemostasis was achieved, the drain was sutured into position, the midline fascia was reapproximated with chromic suture. The platysma layer and subcuticular layer were also reapproximated with chromic suture. Dermabond was used on the skin.   PATIENT DISPOSITION:  PACU - hemodynamically stable.

## 2011-11-02 NOTE — Anesthesia Postprocedure Evaluation (Signed)
  Anesthesia Post-op Note  Patient: Valerie Ayala  Procedure(s) Performed: Procedure(s) (LRB): THYROIDECTOMY (Bilateral)  Patient Location: PACU  Anesthesia Type: General  Level of Consciousness: awake  Airway and Oxygen Therapy: Patient Spontanous Breathing  Post-op Pain: mild  Post-op Assessment: Post-op Vital signs reviewed  Post-op Vital Signs: Reviewed  Complications: No apparent anesthesia complications

## 2011-11-02 NOTE — Interval H&P Note (Signed)
History and Physical Interval Note:  11/02/2011 10:10 AM  Valerie Ayala  has presented today for surgery, with the diagnosis of graves disease   The various methods of treatment have been discussed with the patient and family. After consideration of risks, benefits and other options for treatment, the patient has consented to  Procedure(s) (LRB): THYROIDECTOMY (Bilateral) as a surgical intervention .  The patients' history has been reviewed, patient examined, no change in status, stable for surgery.  I have reviewed the patients' chart and labs.  Questions were answered to the patient's satisfaction.     Rohit Deloria

## 2011-11-03 LAB — CALCIUM: Calcium: 7.6 mg/dL — ABNORMAL LOW (ref 8.4–10.5)

## 2011-11-03 MED ORDER — CALCIUM CARBONATE ANTACID 500 MG PO CHEW
3.0000 | CHEWABLE_TABLET | Freq: Three times a day (TID) | ORAL | Status: DC
  Administered 2011-11-03 (×2): 600 mg via ORAL
  Filled 2011-11-03 (×4): qty 3

## 2011-11-03 MED ORDER — CALCITRIOL 0.25 MCG PO CAPS
0.2500 ug | ORAL_CAPSULE | Freq: Every day | ORAL | Status: DC
  Administered 2011-11-03: 0.25 ug via ORAL
  Filled 2011-11-03: qty 1

## 2011-11-03 NOTE — Progress Notes (Signed)
Discharge instructions/Med Rec Sheet reviewed w/ pt. Pt expressed understanding and copies given w/ prescriptions. Pt ambulated off unit in stable condition, accompanied by her husband, per her request. 

## 2011-11-03 NOTE — Progress Notes (Signed)
Pt complaining of tingling in hands after having blood pressure checked. Says they feel hard to open.  Dr. Annalee Genta notified. Will obtain stat calcium and call if less than 8.

## 2011-11-03 NOTE — Progress Notes (Signed)
Pt's calcium level 7.5 this am. Paged Dr. Annalee Genta.

## 2011-11-03 NOTE — Progress Notes (Signed)
Patient ID: Valerie Ayala, female   DOB: 04-12-1986, 26 y.o.   MRN: [id number] Subjective: She had a lot of nausea but that has now resolved. She complained of tingling of her hands last night and was given calcium by mouth - that has resolved. She complains of neck and throat pain.  Objective: Vital signs in last 24 hours: Temp:  [98 F (36.7 C)-99.1 F (37.3 C)] 98 F (36.7 C) (05/09 0535) Pulse Rate:  [79-134] 79  (05/09 0535) Resp:  [14-24] 18  (05/09 0535) BP: (111-140)/(45-74) 122/45 mmHg (05/09 0535) SpO2:  [97 %-100 %] 100 % (05/09 0535) Weight change:     Intake/Output from previous day: 05/08 0701 - 05/09 0700 In: 1953.8 [I.V.:1453.8; IV Piggyback:500] Out: 110 [Drains:60; Blood:50] Intake/Output this shift:    PHYSICAL EXAM: Neck looks excellent, no swelling or hematoma. Voice is strong and similar to pre-op. Chvostek's sign negative. JP removed.  Lab Results: No results found for this basename: WBC:2,HGB:2,HCT:2,PLT:2 in the last 72 hours BMET  Basename 11/03/11 0306 11/02/11 2127  NA -- --  K -- --  CL -- --  CO2 -- --  GLUCOSE -- --  BUN -- --  CREATININE -- --  CALCIUM 7.5* 8.3*    Studies/Results: No results found.  Medications: I have reviewed the patient's current medications.  Assessment/Plan: Post op total thyroidectomy. Calcium dropped to 7.5 and she was symptomatic. I will check one more time to see if it goes back up or if it is continuing to drop. Will discuss discharge following the next calcium result. I will also discuss with Dr. Talmage Nap about continuing antihypertensive and Methimazole.  LOS: 1 day   Valerie Ayala 11/03/2011, 8:47 AM

## 2011-11-03 NOTE — Discharge Summary (Signed)
  Physician Discharge Summary  Patient ID: Valerie Ayala MRN: [id number] DOB/AGE: June 23, 1986 26 y.o.  Admit date: 11/02/2011 Discharge date: 11/03/2011  Admission Diagnoses:Grave's Disease   Discharge Diagnoses:  Active Problems:  * No active hospital problems. *    Discharged Condition: good  Hospital Course: No complications  Consults: None  Significant Diagnostic Studies: none  Treatments: surgery: Total thyroidectomy  Discharge Exam: Blood pressure 112/57, pulse 77, temperature 98.1 F (36.7 C), temperature source Oral, resp. rate 18, height 5\' 8"  (1.727 m), weight 280 lb 3.3 oz (127.1 kg), last menstrual period 10/23/2011, SpO2 100.00%. PHYSICAL EXAM: Incision excellent, voice normal. JP removed. Calcium dropped to 7.5. 7.6 on discharge.  Disposition: 01-Home or Self Care  Discharge Orders    Future Orders Please Complete By Expires   Diet - low sodium heart healthy      Diet - low sodium heart healthy      Increase activity slowly      Increase activity slowly        Medication List  As of 11/03/2011  1:34 PM   TAKE these medications         albuterol 108 (90 BASE) MCG/ACT inhaler   Commonly known as: PROVENTIL HFA;VENTOLIN HFA   Inhale 2 puffs into the lungs every 6 (six) hours as needed. For shortness of breath      ALPRAZolam 0.5 MG tablet   Commonly known as: XANAX   Take 0.5 mg by mouth 2 (two) times daily as needed. For anxiety      HYDROcodone-acetaminophen 7.5-500 MG per tablet   Commonly known as: LORTAB   Take 1 tablet by mouth every 6 (six) hours as needed for pain.      ibuprofen 200 MG tablet   Commonly known as: ADVIL,MOTRIN   Take 400 mg by mouth every 6 (six) hours as needed. For pain      methimazole 5 MG tablet   Commonly known as: TAPAZOLE   Take 5 mg by mouth daily.      metoprolol succinate 50 MG 24 hr tablet   Commonly known as: TOPROL-XL   Take 50 mg by mouth daily. Take with or immediately following a meal.      promethazine 25  MG suppository   Commonly known as: PHENERGAN   Place 1 suppository (25 mg total) rectally every 6 (six) hours as needed for nausea.      traMADol 50 MG tablet   Commonly known as: ULTRAM   Take 1 tablet (50 mg total) by mouth every 6 (six) hours as needed for pain.           Follow-up Information    Follow up with Serena Colonel, MD. Schedule an appointment as soon as possible for a visit in 1 week.   Contact information:   [address], [address] [place] [address], [address] [place] [address] [phone]          Signed: Serena Colonel 11/03/2011, 1:34 PM

## 2011-11-04 ENCOUNTER — Encounter (HOSPITAL_COMMUNITY): Payer: Self-pay | Admitting: Otolaryngology

## 2011-11-07 NOTE — Discharge Summary (Signed)
  Physician Discharge Summary  Patient ID: Valerie Ayala MRN: [id number] DOB/AGE: Apr 17, 1986 26 y.o.  Admit date: 11/02/2011 Discharge date: 11/07/2011  Admission Diagnoses:Graves Disease  Discharge Diagnoses:  Active Problems:  * No active hospital problems. *    Discharged Condition: good Hosptial course. No complications  Consults: None  Significant Diagnostic Studies: labs: Calcium levels  Treatments: surgery: total thyroidectomy  Discharge Exam: Blood pressure 112/57, pulse 77, temperature 98.1 F (36.7 C), temperature source Oral, resp. rate 18, height 5\' 8"  (1.727 m), weight 280 lb 3.3 oz (127.1 kg), last menstrual period 10/23/2011, SpO2 100.00%. PHYSICAL EXAM: Incision intact, no swelling. Voice normal. JP removed.  Disposition: 01-Home or Self Care  Discharge Orders    Future Orders Please Complete By Expires   Diet - low sodium heart healthy      Diet - low sodium heart healthy      Increase activity slowly      Increase activity slowly        Medication List  As of 11/07/2011  1:26 PM   TAKE these medications         albuterol 108 (90 BASE) MCG/ACT inhaler   Commonly known as: PROVENTIL HFA;VENTOLIN HFA   Inhale 2 puffs into the lungs every 6 (six) hours as needed. For shortness of breath      ALPRAZolam 0.5 MG tablet   Commonly known as: XANAX   Take 0.5 mg by mouth 2 (two) times daily as needed. For anxiety      HYDROcodone-acetaminophen 7.5-500 MG per tablet   Commonly known as: LORTAB   Take 1 tablet by mouth every 6 (six) hours as needed for pain.      ibuprofen 200 MG tablet   Commonly known as: ADVIL,MOTRIN   Take 400 mg by mouth every 6 (six) hours as needed. For pain      methimazole 5 MG tablet   Commonly known as: TAPAZOLE   Take 5 mg by mouth daily.      metoprolol succinate 50 MG 24 hr tablet   Commonly known as: TOPROL-XL   Take 50 mg by mouth daily. Take with or immediately following a meal.      promethazine 25 MG suppository   Commonly known as: PHENERGAN   Place 1 suppository (25 mg total) rectally every 6 (six) hours as needed for nausea.      traMADol 50 MG tablet   Commonly known as: ULTRAM   Take 1 tablet (50 mg total) by mouth every 6 (six) hours as needed for pain.           Follow-up Information    Follow up with Serena Colonel, MD. Schedule an appointment as soon as possible for a visit in 1 week.   Contact information:   [address], [address], [address] [place] [address] [phone]          Signed: Serena Colonel 11/07/2011, 1:26 PM

## 2012-09-21 ENCOUNTER — Other Ambulatory Visit: Payer: Self-pay | Admitting: *Deleted

## 2012-09-21 DIAGNOSIS — E079 Disorder of thyroid, unspecified: Secondary | ICD-10-CM

## 2012-09-21 DIAGNOSIS — E215 Disorder of parathyroid gland, unspecified: Secondary | ICD-10-CM

## 2012-09-26 ENCOUNTER — Other Ambulatory Visit (INDEPENDENT_AMBULATORY_CARE_PROVIDER_SITE_OTHER): Payer: Self-pay

## 2012-09-26 DIAGNOSIS — E215 Disorder of parathyroid gland, unspecified: Secondary | ICD-10-CM

## 2012-09-26 DIAGNOSIS — E079 Disorder of thyroid, unspecified: Secondary | ICD-10-CM

## 2012-09-26 LAB — BASIC METABOLIC PANEL WITH GFR
BUN: 14 mg/dL (ref 6–23)
CO2: 25 mEq/L (ref 19–32)
Calcium: 7.6 mg/dL — ABNORMAL LOW (ref 8.4–10.5)
Creat: 0.87 mg/dL (ref 0.50–1.10)

## 2012-09-26 LAB — TSH: TSH: 7.092 u[IU]/mL — ABNORMAL HIGH (ref 0.350–4.500)

## 2012-09-26 NOTE — Progress Notes (Signed)
Pt came in for labs only 

## 2012-09-27 ENCOUNTER — Telehealth: Payer: Self-pay | Admitting: *Deleted

## 2012-09-27 ENCOUNTER — Other Ambulatory Visit: Payer: Self-pay | Admitting: Nurse Practitioner

## 2012-09-27 MED ORDER — CALCITRIOL 0.5 MCG PO CAPS: 0.5000 ug | ORAL_CAPSULE | Freq: Every day | ORAL | Status: DC

## 2012-09-27 MED ORDER — LEVOTHYROXINE SODIUM 150 MCG PO TABS: 150.0000 ug | ORAL_TABLET | Freq: Every day | ORAL | Status: DC

## 2012-09-27 MED ORDER — LEVOTHYROXINE SODIUM 137 MCG PO TABS: 137.0000 ug | ORAL_TABLET | Freq: Every day | ORAL | Status: DC

## 2012-09-27 NOTE — Telephone Encounter (Signed)
Patient has been out of Calcitriol for 2 weeks but not Synthroid.  She has been taking Synthroid regularly.

## 2012-09-27 NOTE — Telephone Encounter (Signed)
Patient notified meds sent to pharmacy. 

## 2012-09-27 NOTE — Telephone Encounter (Signed)
Changed RX to synthroid daily. Patient stated she has been taking synthroid 137 daily and TSH levels were high

## 2012-09-28 NOTE — Telephone Encounter (Signed)
Pt aware.

## 2012-11-26 ENCOUNTER — Encounter: Payer: Self-pay | Admitting: Nurse Practitioner

## 2012-11-26 ENCOUNTER — Ambulatory Visit (INDEPENDENT_AMBULATORY_CARE_PROVIDER_SITE_OTHER): Payer: Self-pay | Admitting: Nurse Practitioner

## 2012-11-26 VITALS — BP 132/51 | HR 61 | Temp 98.3°F | Wt 142.0 lb

## 2012-11-26 DIAGNOSIS — E039 Hypothyroidism, unspecified: Secondary | ICD-10-CM

## 2012-11-26 DIAGNOSIS — F411 Generalized anxiety disorder: Secondary | ICD-10-CM

## 2012-11-26 LAB — TSH: TSH: 2.125 u[IU]/mL (ref 0.350–4.500)

## 2012-11-26 MED ORDER — CLONAZEPAM 0.5 MG PO TABS: 0.5000 mg | ORAL_TABLET | Freq: Two times a day (BID) | ORAL | Status: DC | PRN

## 2012-11-26 NOTE — Patient Instructions (Signed)

## 2012-11-26 NOTE — Addendum Note (Signed)
Addended by: Bennie Pierini on: 11/26/2012 08:40 AM   Modules accepted: Level of Service

## 2012-11-26 NOTE — Addendum Note (Signed)
Addended by: Orma Render F on: 11/26/2012 12:19 PM   Modules accepted: Orders

## 2012-11-26 NOTE — Addendum Note (Signed)
Addended by: Orma Render F on: 11/26/2012 12:18 PM   Modules accepted: Orders

## 2012-11-26 NOTE — Progress Notes (Addendum)
  Subjective:    Patient ID: Valerie Ayala, female    DOB: December 16, 1985, 27 y.o.   MRN: [id number]  Thyroid Problem Presents for follow-up (Dose of synthroid was increased about 6 weeks ago.Marland Kitchen) visit. Patient reports no anxiety, constipation, depressed mood, diaphoresis, diarrhea, dry skin, leg swelling, nail problem, palpitations, weight gain or weight loss. The symptoms have been stable.  GAD Patient has had issue of anxiety for many years ( runs in her family). Patient has been on xanax in the past which helped a lot ut she has been out of xanax for several weeks. Patient having panic attacks and yelling at her kids a lot. Needs something to kep her calm- Really doesn't want antidepressant.    Review of Systems  Constitutional: Negative for weight loss, weight gain and diaphoresis.  Cardiovascular: Negative for palpitations.  Gastrointestinal: Negative for diarrhea and constipation.       Objective:   Physical Exam  Constitutional: She is oriented to person, place, and time. She appears well-developed and well-nourished.  Cardiovascular: Normal rate and normal heart sounds.   Pulmonary/Chest: Effort normal and breath sounds normal.  Neurological: She is alert and oriented to person, place, and time. She has normal reflexes.  Skin: Skin is warm.  Psychiatric: She has a normal mood and affect. Her behavior is normal. Judgment and thought content normal.    BP 132/51  Pulse 61  Temp(Src) 98.3 F (36.8 C) (Oral)  Wt 142 lb (64.411 kg)  BMI 21.6 kg/m2       Assessment & Plan:  1. Hypothyroidism LAbs pending - TSH  2. GAD (generalized anxiety disorder) Stress management - clonazePAM (KLONOPIN) 0.5 MG tablet; Take 1 tablet (0.5 mg total) by mouth 2 (two) times daily as needed for anxiety.  Dispense: 60 tablet; Refill: 0

## 2012-12-31 ENCOUNTER — Other Ambulatory Visit: Payer: Self-pay | Admitting: Nurse Practitioner

## 2013-01-01 NOTE — Telephone Encounter (Signed)
Please call in Clonazepam rx with 1 refill

## 2013-01-01 NOTE — Telephone Encounter (Signed)
Last seen and filled 11/26/12, call into [facility]

## 2013-01-02 NOTE — Telephone Encounter (Signed)
Done

## 2013-01-13 ENCOUNTER — Telehealth: Payer: Self-pay | Admitting: Family Medicine

## 2013-01-13 ENCOUNTER — Other Ambulatory Visit: Payer: Self-pay | Admitting: Family Medicine

## 2013-01-13 DIAGNOSIS — N390 Urinary tract infection, site not specified: Secondary | ICD-10-CM

## 2013-01-13 MED ORDER — CIPROFLOXACIN HCL 250 MG PO TABS: 250.0000 mg | ORAL_TABLET | Freq: Two times a day (BID) | ORAL | Status: DC

## 2013-01-13 NOTE — Telephone Encounter (Signed)
Started with UTI symptoms of dysuria since yesterday. Couldn't wait until tomorrow. Sister of [place]. No vaginal discharge. Back is starting to get sore. No fever . No nausea , no vomiting.  Urine dark almost orange.  Dx UTI  Plan: cipro 250 mg bid x 7 days.  RTC prn.  Jahmia Berrett P. Modesto Charon, M.D.

## 2013-03-11 IMAGING — CR DG CHEST 2V
2 series · 2 of 2 positions shown · non-contrast
Comparison: No priors.

CLINICAL DATA: Preoperative study for thyroidectomy.

CHEST - 2 VIEW

[view not recorded (1 of 2)]
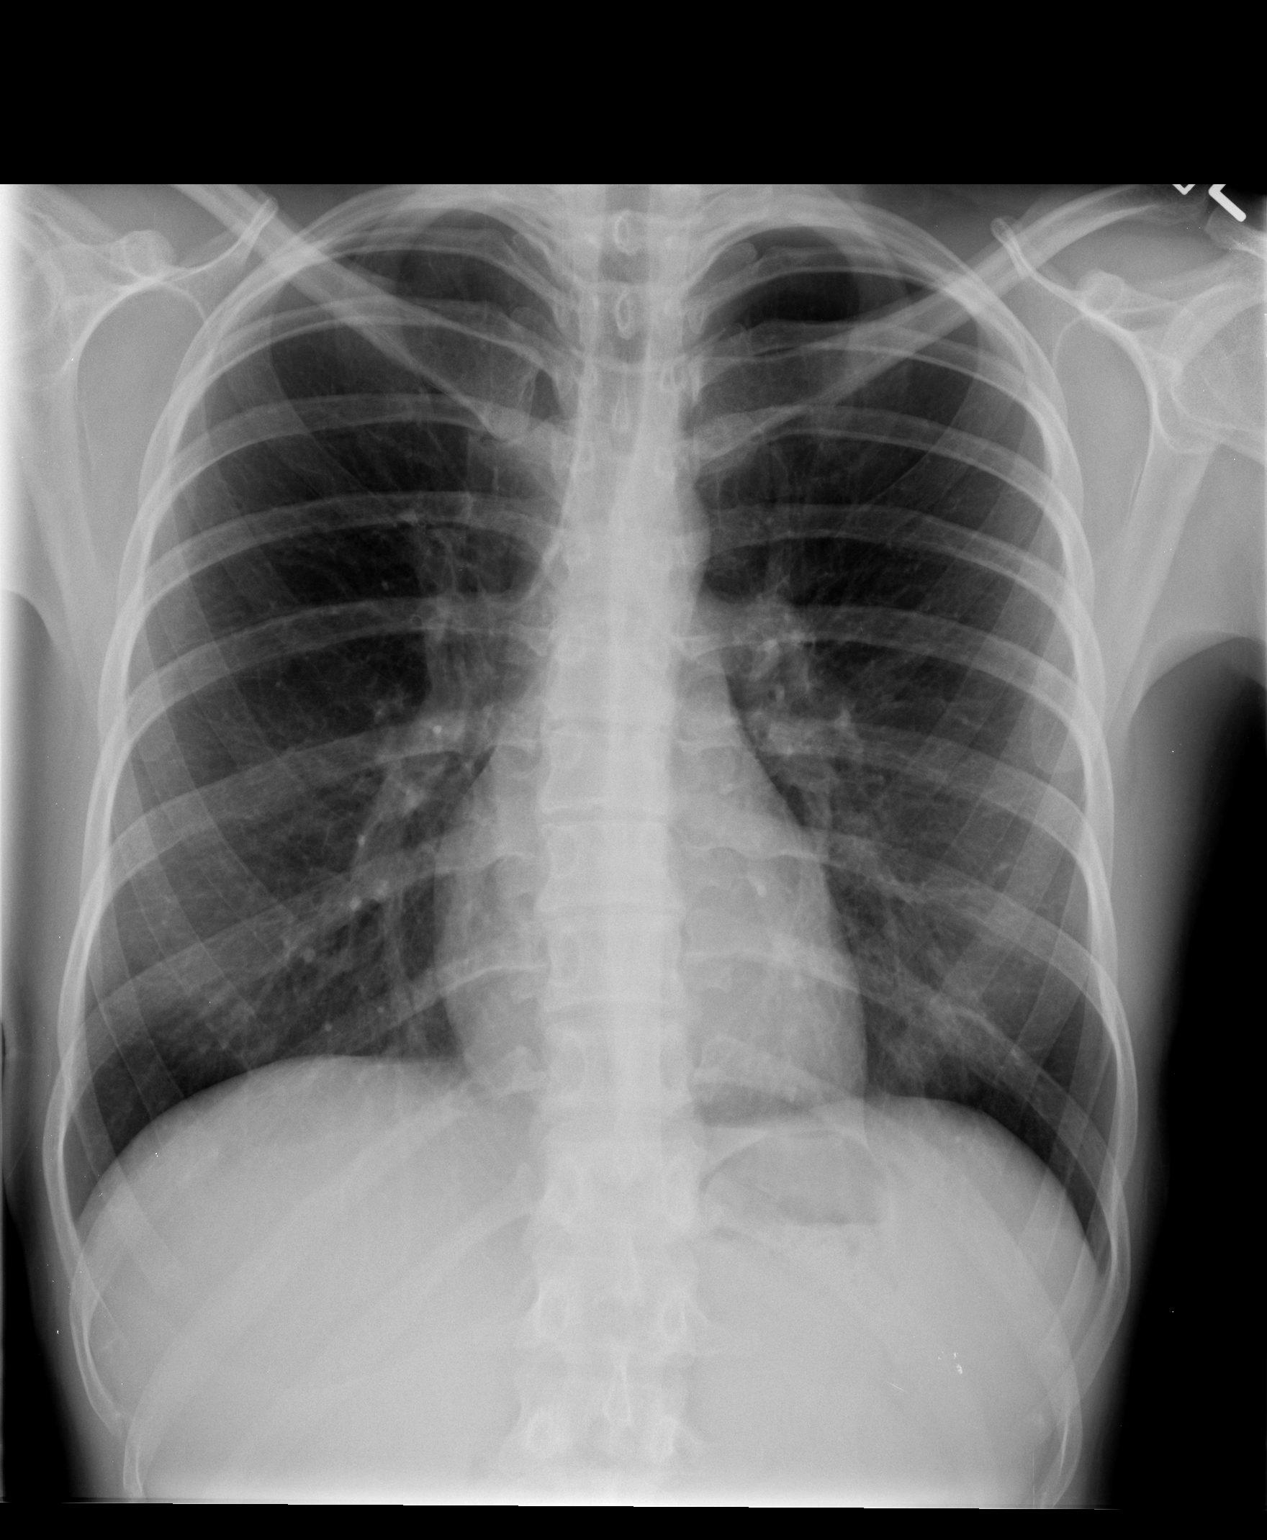

[view not recorded (2 of 2)]
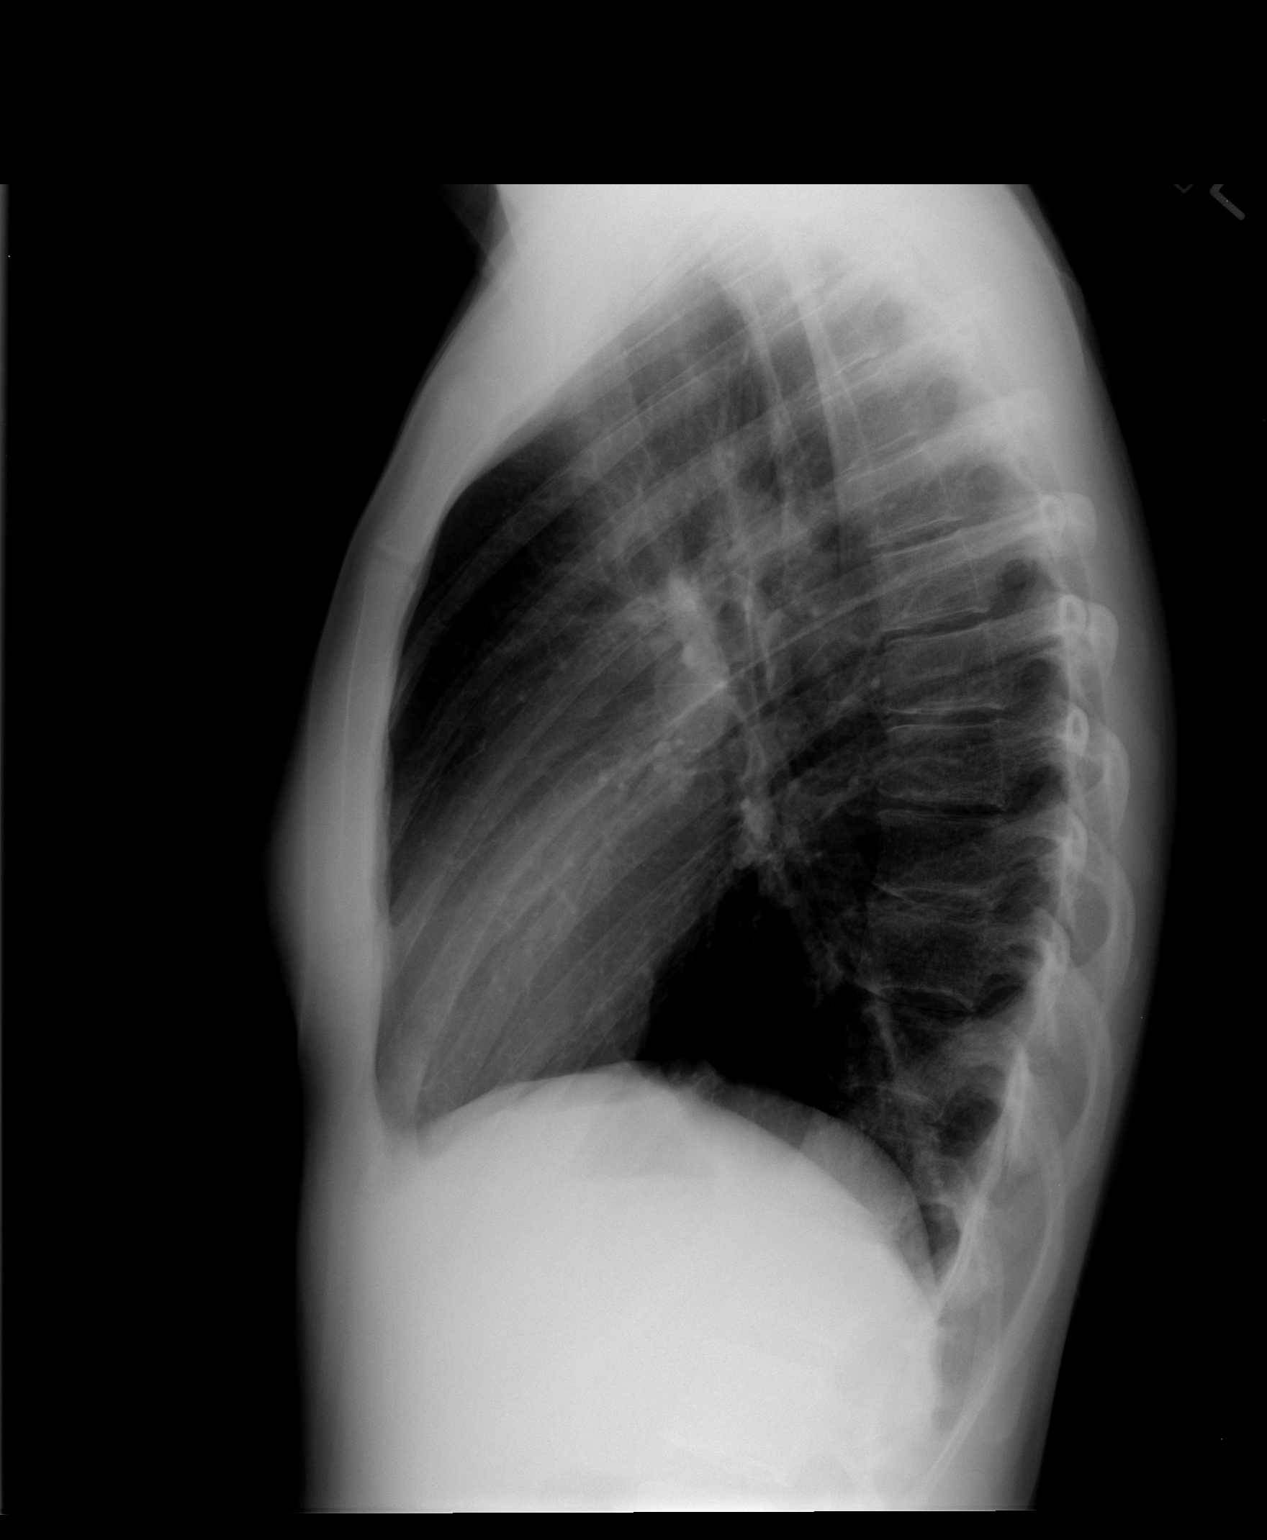

[2 of 2 positions shown; findings below may reference images not displayed]

FINDINGS: Lung volumes are normal.  No consolidative airspace
disease.  No pleural effusions.  No pneumothorax.  No pulmonary
nodule or mass noted.  Pulmonary vasculature and the
cardiomediastinal silhouette are within normal limits.
IMPRESSION: 1. No radiographic evidence of acute cardiopulmonary disease.

## 2013-03-15 ENCOUNTER — Other Ambulatory Visit: Payer: Self-pay | Admitting: Nurse Practitioner

## 2013-03-18 NOTE — Telephone Encounter (Signed)
Please call in x for klonopin

## 2013-03-18 NOTE — Telephone Encounter (Signed)
Last seen 11/26/12  MMM   If approved route to nurse to phone into [facility]

## 2013-03-19 NOTE — Telephone Encounter (Signed)
Called in.

## 2013-03-21 ENCOUNTER — Other Ambulatory Visit: Payer: Self-pay | Admitting: Nurse Practitioner

## 2013-03-22 NOTE — Telephone Encounter (Signed)
Medication wasn't called into pharmacy when it was authorized on 9/22. Left authorization on [facility] voicemail. Patient aware.

## 2013-03-22 NOTE — Telephone Encounter (Signed)
Was done 03/15/13

## 2013-04-10 ENCOUNTER — Other Ambulatory Visit: Payer: Self-pay | Admitting: *Deleted

## 2013-04-10 MED ORDER — LEVOTHYROXINE SODIUM 150 MCG PO TABS: 150.0000 ug | ORAL_TABLET | Freq: Every day | ORAL | Status: DC

## 2013-05-07 ENCOUNTER — Ambulatory Visit (INDEPENDENT_AMBULATORY_CARE_PROVIDER_SITE_OTHER): Payer: Self-pay | Admitting: Family Medicine

## 2013-05-07 ENCOUNTER — Other Ambulatory Visit: Payer: Self-pay | Admitting: Family Medicine

## 2013-05-07 ENCOUNTER — Encounter: Payer: Self-pay | Admitting: Family Medicine

## 2013-05-07 VITALS — BP 119/77 | HR 75 | Temp 98.2°F | Ht 68.0 in | Wt 131.0 lb

## 2013-05-07 DIAGNOSIS — R05 Cough: Secondary | ICD-10-CM

## 2013-05-07 DIAGNOSIS — R059 Cough, unspecified: Secondary | ICD-10-CM

## 2013-05-07 DIAGNOSIS — R51 Headache: Secondary | ICD-10-CM

## 2013-05-07 DIAGNOSIS — R509 Fever, unspecified: Secondary | ICD-10-CM

## 2013-05-07 DIAGNOSIS — J029 Acute pharyngitis, unspecified: Secondary | ICD-10-CM

## 2013-05-07 DIAGNOSIS — J45909 Unspecified asthma, uncomplicated: Secondary | ICD-10-CM

## 2013-05-07 DIAGNOSIS — R6889 Other general symptoms and signs: Secondary | ICD-10-CM

## 2013-05-07 DIAGNOSIS — J329 Chronic sinusitis, unspecified: Secondary | ICD-10-CM

## 2013-05-07 MED ORDER — AMOXICILLIN 500 MG PO CAPS: ORAL_CAPSULE | ORAL | Status: DC

## 2013-05-07 MED ORDER — TRAMADOL HCL 50 MG PO TABS
ORAL_TABLET | ORAL | Status: DC
Start: 2013-05-07 — End: 2013-07-14

## 2013-05-07 NOTE — Progress Notes (Addendum)
Subjective:    Patient ID: Valerie Ayala, female    DOB: [birth year], 27 y.o.   MRN: [id number]  HPI Patient here today for flu-like symptoms. This is the fifth day with headache and fever. The patient is tearful and obviously very uncomfortable. She has had yellow and green drainage and congestion, sore throat, with cough and congestion clear    Patient Active Problem List   Diagnosis Date Noted  . Hypothyroidism 11/26/2012  . GAD (generalized anxiety disorder) 11/26/2012   Outpatient Encounter Prescriptions as of 05/07/2013  Medication Sig  . albuterol (PROVENTIL HFA;VENTOLIN HFA) 108 (90 BASE) MCG/ACT inhaler Inhale 2 puffs into the lungs every 6 (six) hours as needed. For shortness of breath  . calcitRIOL (ROCALTROL) 0.5 MCG capsule Take 1 capsule (0.5 mcg total) by mouth daily.  . clonazePAM (KLONOPIN) 0.5 MG tablet TAKE ONE TABLET BY MOUTH TWICE DAILY AS NEEDED FOR ANXIETY  . ibuprofen (ADVIL,MOTRIN) 200 MG tablet Take 400 mg by mouth every 6 (six) hours as needed. For pain  . levothyroxine (SYNTHROID, LEVOTHROID) 150 MCG tablet Take 1 tablet (150 mcg total) by mouth daily.  . [DISCONTINUED] ciprofloxacin (CIPRO) 250 MG tablet Take 1 tablet (250 mg total) by mouth 2 (two) times daily.    Review of Systems  Constitutional: Positive for fever (highest was 103.0), chills and fatigue.  HENT: Positive for ear pain (pressure), postnasal drip and sore throat. Congestion: head and chest.   Eyes: Negative.   Respiratory: Positive for cough and chest tightness.   Cardiovascular: Negative.   Gastrointestinal: Negative.   Endocrine: Negative.   Genitourinary: Negative.   Musculoskeletal: Positive for myalgias.  Skin: Negative.   Allergic/Immunologic: Negative.   Neurological: Negative.   Hematological: Negative.   Psychiatric/Behavioral: Negative.        Objective:   Physical Exam  Nursing note and vitals reviewed. Constitutional: She is oriented to person, place, and time. She  appears well-developed and well-nourished. She appears distressed.  HENT:  Head: Normocephalic and atraumatic.  Right Ear: External ear normal.  Left Ear: External ear normal.  Mouth/Throat: Oropharynx is clear and moist. No oropharyngeal exudate.  Nasal congestion bilaterally with purulent yellow drainage left side. There is sinus tenderness in both maxillary sinuses ethmoid and both frontal sinuses.  Eyes: Conjunctivae and EOM are normal. Pupils are equal, round, and reactive to light. Right eye exhibits no discharge. Left eye exhibits no discharge. No scleral icterus.  Neck: Normal range of motion. Neck supple.  Cardiovascular: Normal rate, regular rhythm and normal heart sounds.   No murmur heard. Pulmonary/Chest: Effort normal. No respiratory distress. She has wheezes. She has no rales.  There were some faint occasional wheezes  Musculoskeletal: Normal range of motion.  Lymphadenopathy:    She has cervical adenopathy (there is anterior cervical tenderness bilaterally).  Neurological: She is alert and oriented to person, place, and time.  Skin: Skin is warm and dry. No rash noted.  Psychiatric: She has a normal mood and affect. Her behavior is normal. Judgment and thought content normal.  Malaise   BP 119/77  Pulse 75  Temp(Src) 98.2 F (36.8 C) (Oral)  Ht 5\' 8"  (1.727 m)  Wt 131 lb (59.421 kg)  BMI 19.92 kg/m2  Results for orders placed in visit on 05/07/13  POCT INFLUENZA A/B      Result Value Range   Influenza A, POC Negative     Influenza B, POC Negative    POCT RAPID STREP A (OFFICE)  Result Value Range   Rapid Strep A Screen Negative  Negative         Assessment & Plan:   1. Rhinosinusitis   2. Flu-like symptoms   3. Sore throat   4. Asthma   5. Fever   6. Cough   7. Headache(784.0)    Orders Placed This Encounter  Procedures  . Strep A culture, throat  . POCT Influenza A/B  . POCT rapid strep A   Meds ordered this encounter  Medications  .  amoxicillin (AMOXIL) 500 MG capsule    Sig: 1   4 times daily for infection until completed    Dispense:  40 capsule    Refill:  0  . traMADol (ULTRAM) 50 MG tablet    Sig: 1 twice daily if needed for severe headache    Dispense:  20 tablet    Refill:  0   Patient Instructions  Drink plenty of fluids Take Tylenol or Advil as needed for aches pains and fever Take Mucinex maximum strength, blue and white in color, 1 twice daily for cough and congestion with a large glass of water Saline irrigation using a Nettie pot would be helpful for the sinusitis Drink plenty of fluids   Try to stop smoking  Nyra Capes MD

## 2013-05-07 NOTE — Patient Instructions (Signed)
Drink plenty of fluids Take Tylenol or Advil as needed for aches pains and fever Take Mucinex maximum strength, blue and white in color, 1 twice daily for cough and congestion with a large glass of water Saline irrigation using a Nettie pot would be helpful for the sinusitis Drink plenty of fluids

## 2013-05-09 LAB — STREP A CULTURE, THROAT: Strep A Culture: NEGATIVE

## 2013-05-10 ENCOUNTER — Other Ambulatory Visit: Payer: Self-pay | Admitting: *Deleted

## 2013-05-10 MED ORDER — CLONAZEPAM 0.5 MG PO TABS: 0.5000 mg | ORAL_TABLET | Freq: Two times a day (BID) | ORAL | Status: DC | PRN

## 2013-05-10 NOTE — Telephone Encounter (Signed)
Please feel clonazepam

## 2013-05-10 NOTE — Telephone Encounter (Signed)
Requesting refill on Klonopin 0.5. Last filled on 03/15/13. Phone in to pharmacy if approved.

## 2013-05-11 NOTE — Telephone Encounter (Signed)
rx called into pharmacy

## 2013-06-12 ENCOUNTER — Other Ambulatory Visit: Payer: Self-pay | Admitting: Nurse Practitioner

## 2013-06-13 ENCOUNTER — Other Ambulatory Visit: Payer: Self-pay | Admitting: *Deleted

## 2013-06-13 MED ORDER — LEVOTHYROXINE SODIUM 150 MCG PO TABS: 150.0000 ug | ORAL_TABLET | Freq: Every day | ORAL | Status: DC

## 2013-06-13 MED ORDER — CLONAZEPAM 0.5 MG PO TABS: 0.5000 mg | ORAL_TABLET | Freq: Two times a day (BID) | ORAL | Status: DC | PRN

## 2013-06-13 NOTE — Telephone Encounter (Signed)
Last seen 05/07/13  DWM  IF approved route to nurse [facility]

## 2013-06-13 NOTE — Telephone Encounter (Signed)
This is okay x1 

## 2013-06-13 NOTE — Telephone Encounter (Signed)
Patient requesting that you authorize script with an additional refill if possible. If approved route to nurse to call in.

## 2013-06-13 NOTE — Telephone Encounter (Signed)
Called in.

## 2013-06-13 NOTE — Telephone Encounter (Signed)
Please call in klonopin rx 

## 2013-07-14 ENCOUNTER — Other Ambulatory Visit: Payer: Self-pay | Admitting: Family Medicine

## 2013-07-15 NOTE — Telephone Encounter (Signed)
This is okay x1 

## 2013-07-15 NOTE — Telephone Encounter (Signed)
Patient last seen in office on 05-07-13. Please advise. If approved please print and route to [facility] so nurse can call patient to pick up

## 2013-08-02 ENCOUNTER — Other Ambulatory Visit: Payer: Self-pay | Admitting: Nurse Practitioner

## 2013-08-05 NOTE — Telephone Encounter (Signed)
Last seen 05/07/13  DWM  If approved route to nurse to call into [facility]

## 2013-08-06 MED ORDER — CLONAZEPAM 0.5 MG PO TABS: ORAL_TABLET | ORAL | Status: DC

## 2013-08-06 NOTE — Telephone Encounter (Signed)
Please call in clonazepam with 1 refills 

## 2013-08-06 NOTE — Telephone Encounter (Signed)
rx called to into pharmacy

## 2013-08-06 NOTE — Addendum Note (Signed)
Addended by: Gwenith DailyHUDY, Moises Terpstra N on: 08/06/2013 08:08 AM   Modules accepted: Orders

## 2013-09-16 ENCOUNTER — Other Ambulatory Visit: Payer: Self-pay | Admitting: Nurse Practitioner

## 2013-09-17 ENCOUNTER — Other Ambulatory Visit: Payer: Self-pay | Admitting: *Deleted

## 2013-11-13 ENCOUNTER — Other Ambulatory Visit: Payer: Self-pay | Admitting: *Deleted

## 2013-11-13 MED ORDER — CALCITRIOL 0.5 MCG PO CAPS: 0.5000 ug | ORAL_CAPSULE | Freq: Every day | ORAL | Status: DC

## 2013-11-14 ENCOUNTER — Telehealth: Payer: Self-pay | Admitting: *Deleted

## 2013-11-14 MED ORDER — CALCITRIOL 0.5 MCG PO CAPS: 0.5000 ug | ORAL_CAPSULE | Freq: Two times a day (BID) | ORAL | Status: DC

## 2013-11-14 NOTE — Telephone Encounter (Signed)
Received fax from pharmacy. Patient just had calcitriol refilled but states she takes 2 qd instead of 1. Pharmacy  Needs clarification. Please advise

## 2013-11-14 NOTE — Telephone Encounter (Signed)
rx corrected 

## 2013-12-18 ENCOUNTER — Ambulatory Visit: Payer: Self-pay | Admitting: Physician Assistant

## 2013-12-19 ENCOUNTER — Ambulatory Visit (INDEPENDENT_AMBULATORY_CARE_PROVIDER_SITE_OTHER): Payer: Self-pay | Admitting: Nurse Practitioner

## 2013-12-19 ENCOUNTER — Other Ambulatory Visit: Payer: Self-pay

## 2013-12-19 ENCOUNTER — Other Ambulatory Visit: Payer: Self-pay | Admitting: Nurse Practitioner

## 2013-12-19 ENCOUNTER — Encounter: Payer: Self-pay | Admitting: Nurse Practitioner

## 2013-12-19 VITALS — BP 124/80 | HR 99 | Temp 97.9°F | Ht 68.0 in | Wt 140.0 lb

## 2013-12-19 DIAGNOSIS — E038 Other specified hypothyroidism: Secondary | ICD-10-CM

## 2013-12-19 DIAGNOSIS — F411 Generalized anxiety disorder: Secondary | ICD-10-CM

## 2013-12-19 MED ORDER — CIPROFLOXACIN HCL 500 MG PO TABS: 500.0000 mg | ORAL_TABLET | Freq: Two times a day (BID) | ORAL | Status: DC

## 2013-12-19 MED ORDER — CLONAZEPAM 0.5 MG PO TABS: ORAL_TABLET | ORAL | Status: DC

## 2013-12-19 NOTE — Patient Instructions (Signed)
Hypothyroidism The thyroid is a large gland located in the lower front of your neck. The thyroid gland helps control metabolism. Metabolism is how your body handles food. It controls metabolism with the hormone thyroxine. When this gland is underactive (hypothyroid), it produces too little hormone.  CAUSES These include:   Absence or destruction of thyroid tissue.  Goiter due to iodine deficiency.  Goiter due to medications.  Congenital defects (since birth).  Problems with the pituitary. This causes a lack of TSH (thyroid stimulating hormone). This hormone tells the thyroid to turn out more hormone. SYMPTOMS  Lethargy (feeling as though you have no energy)  Cold intolerance  Weight gain (in spite of normal food intake)  Dry skin  Coarse hair  Menstrual irregularity (if severe, may lead to infertility)  Slowing of thought processes Cardiac problems are also caused by insufficient amounts of thyroid hormone. Hypothyroidism in the newborn is cretinism, and is an extreme form. It is important that this form be treated adequately and immediately or it will lead rapidly to retarded physical and mental development. DIAGNOSIS  To prove hypothyroidism, your caregiver may do blood tests and ultrasound tests. Sometimes the signs are hidden. It may be necessary for your caregiver to watch this illness with blood tests either before or after diagnosis and treatment. TREATMENT  Low levels of thyroid hormone are increased by using synthetic thyroid hormone. This is a safe, effective treatment. It usually takes about four weeks to gain the full effects of the medication. After you have the full effect of the medication, it will generally take another four weeks for problems to leave. Your caregiver may start you on low doses. If you have had heart problems the dose may be gradually increased. It is generally not an emergency to get rapidly to normal. HOME CARE INSTRUCTIONS   Take your  medications as your caregiver suggests. Let your caregiver know of any medications you are taking or start taking. Your caregiver will help you with dosage schedules.  As your condition improves, your dosage needs may increase. It will be necessary to have continuing blood tests as suggested by your caregiver.  Report all suspected medication side effects to your caregiver. SEEK MEDICAL CARE IF: Seek medical care if you develop:  Sweating.  Tremulousness (tremors).  Anxiety.  Rapid weight loss.  Heat intolerance.  Emotional swings.  Diarrhea.  Weakness. SEEK IMMEDIATE MEDICAL CARE IF:  You develop chest pain, an irregular heart beat (palpitations), or a rapid heart beat. MAKE SURE YOU:   Understand these instructions.  Will watch your condition.  Will get help right away if you are not doing well or get worse. Document Released: 06/13/2005 Document Revised: 09/05/2011 Document Reviewed: 02/01/2008 Exit[employer] Patient Information 2015 [employer]. This information is not intended to replace advice given to you by your health care provider. Make sure you discuss any questions you have with your health care provider.  

## 2013-12-19 NOTE — Progress Notes (Signed)
  Subjective:    Patient ID: Valerie Ayala, female    DOB: Nov 18, 1985, [age] y.o.   MRN: [id number]  Patient here today for follow up of chronic medical problems.  Thyroid Problem Presents for follow-up (Dose of synthroid was increased about 6 weeks ago.Marland Kitchen.) visit. Patient reports no anxiety, constipation, depressed mood, diaphoresis, diarrhea, dry skin, leg swelling, nail problem, palpitations, weight gain or weight loss. The symptoms have been stable.  GAD Patient has had issue of anxiety for many years ( runs in her family). Patient has been on xanax in the past which helped a lot ut she has been out of xanax for several weeks. Patient having panic attacks and yelling at her kids a lot. Needs something to kep her calm- Really doesn't want antidepressant.    Review of Systems  Constitutional: Negative for weight loss, weight gain and diaphoresis.  Cardiovascular: Negative for palpitations.  Gastrointestinal: Negative for diarrhea and constipation.       Objective:   Physical Exam  Constitutional: She is oriented to person, place, and time. She appears well-developed and well-nourished.  Cardiovascular: Normal rate and normal heart sounds.   Pulmonary/Chest: Effort normal and breath sounds normal.  Neurological: She is alert and oriented to person, place, and time. She has normal reflexes.  Skin: Skin is warm.  Psychiatric: She has a normal mood and affect. Her behavior is normal. Judgment and thought content normal.    There were no vitals taken for this visit.       Assessment & Plan:  1. Hypothyroidism LAbs pending - TSH  2. GAD (generalized anxiety disorder) Stress management - clonazePAM (KLONOPIN) 0.5 MG tablet; Take 1 tablet (0.5 mg total) by mouth 2 (two) times daily as needed for anxiety.  Dispense: 60 tablet; Refill: 0  Stress management Follow up in 6 months  Mary-Margaret Daphine DeutscherMartin, FNP

## 2013-12-20 LAB — THYROID PANEL WITH TSH
FREE THYROXINE INDEX: 2.6 (ref 1.2–4.9)
T3 UPTAKE RATIO: 27 % (ref 24–39)
T4, Total: 9.8 ug/dL (ref 4.5–12.0)
TSH: 0.838 u[IU]/mL (ref 0.450–4.500)

## 2013-12-20 LAB — BMP8+EGFR
BUN/Creatinine Ratio: 13 (ref 8–20)
BUN: 13 mg/dL (ref 6–20)
CO2: 24 mmol/L (ref 18–29)
CREATININE: 1.03 mg/dL — AB (ref 0.57–1.00)
Calcium: 8.6 mg/dL — ABNORMAL LOW (ref 8.7–10.2)
Chloride: 99 mmol/L (ref 97–108)
GFR, EST AFRICAN AMERICAN: 85 mL/min/{1.73_m2} (ref >59)
GFR, EST NON AFRICAN AMERICAN: 74 mL/min/{1.73_m2} (ref >59)
Glucose: 93 mg/dL (ref 65–99)
Potassium: 4 mmol/L (ref 3.5–5.2)
SODIUM: 139 mmol/L (ref 134–144)

## 2013-12-22 ENCOUNTER — Other Ambulatory Visit: Payer: Self-pay | Admitting: Nurse Practitioner

## 2013-12-22 MED ORDER — LEVOTHYROXINE SODIUM 150 MCG PO TABS: ORAL_TABLET | ORAL | Status: DC

## 2014-01-31 ENCOUNTER — Telehealth: Payer: Self-pay | Admitting: *Deleted

## 2014-01-31 NOTE — Telephone Encounter (Signed)
Patient is requesting a prescription for Chantix. She has not taken this before but would like to use it to quit smoking.

## 2014-02-01 MED ORDER — VARENICLINE TARTRATE 0.5 MG X 11 & 1 MG X 42 PO MISC: ORAL | Status: DC

## 2014-02-01 NOTE — Telephone Encounter (Signed)
chantix rx sent to pharmacy 

## 2014-06-01 ENCOUNTER — Other Ambulatory Visit: Payer: Self-pay | Admitting: Nurse Practitioner

## 2014-06-02 NOTE — Telephone Encounter (Signed)
Refilled, Patient needs 6 mos appt this month,first of Jan.

## 2014-07-03 ENCOUNTER — Emergency Department (HOSPITAL_COMMUNITY)
Admission: EM | Admit: 2014-07-03 | Discharge: 2014-07-03 | Disposition: A | Discharge status: Home / Self Care | Payer: 59 | Attending: Emergency Medicine | Admitting: Emergency Medicine

## 2014-07-03 ENCOUNTER — Encounter (HOSPITAL_COMMUNITY): Payer: Self-pay | Admitting: Emergency Medicine

## 2014-07-03 DIAGNOSIS — Z87891 Personal history of nicotine dependence: Secondary | ICD-10-CM | POA: Diagnosis not present

## 2014-07-03 DIAGNOSIS — K089 Disorder of teeth and supporting structures, unspecified: Secondary | ICD-10-CM | POA: Insufficient documentation

## 2014-07-03 DIAGNOSIS — Z792 Long term (current) use of antibiotics: Secondary | ICD-10-CM | POA: Insufficient documentation

## 2014-07-03 DIAGNOSIS — Z8679 Personal history of other diseases of the circulatory system: Secondary | ICD-10-CM | POA: Insufficient documentation

## 2014-07-03 DIAGNOSIS — Z8701 Personal history of pneumonia (recurrent): Secondary | ICD-10-CM | POA: Insufficient documentation

## 2014-07-03 DIAGNOSIS — E079 Disorder of thyroid, unspecified: Secondary | ICD-10-CM | POA: Diagnosis not present

## 2014-07-03 DIAGNOSIS — J45909 Unspecified asthma, uncomplicated: Secondary | ICD-10-CM | POA: Insufficient documentation

## 2014-07-03 DIAGNOSIS — Z79899 Other long term (current) drug therapy: Secondary | ICD-10-CM | POA: Insufficient documentation

## 2014-07-03 DIAGNOSIS — K088 Other specified disorders of teeth and supporting structures: Secondary | ICD-10-CM | POA: Diagnosis present

## 2014-07-03 DIAGNOSIS — G8929 Other chronic pain: Secondary | ICD-10-CM | POA: Diagnosis not present

## 2014-07-03 DIAGNOSIS — F419 Anxiety disorder, unspecified: Secondary | ICD-10-CM | POA: Insufficient documentation

## 2014-07-03 DIAGNOSIS — K0889 Other specified disorders of teeth and supporting structures: Secondary | ICD-10-CM

## 2014-07-03 MED ORDER — OXYCODONE-ACETAMINOPHEN 5-325 MG PO TABS
1.0000 | ORAL_TABLET | Freq: Once | ORAL | Status: AC
  Administered 2014-07-03: 1 via ORAL
  Filled 2014-07-03: qty 1

## 2014-07-03 MED ORDER — OXYCODONE-ACETAMINOPHEN 5-325 MG PO TABS: 1.0000 | ORAL_TABLET | ORAL | Status: DC | PRN

## 2014-07-03 MED ORDER — AMOXICILLIN 500 MG PO CAPS: 500.0000 mg | ORAL_CAPSULE | Freq: Three times a day (TID) | ORAL | Status: DC

## 2014-07-03 NOTE — ED Provider Notes (Signed)
CSN: [id number]     Arrival date & time 07/03/14  1056 History  This chart was scribed for non-physician practitioner, Arthor Captain, PA-C, working with Doug Sou, MD by Charline Bills, ED Scribe. This patient was seen in room TR10C/TR10C and the patient's care was started at 1:12 PM.   Chief Complaint  Patient presents with  . Dental Pain   The history is provided by the patient. No language interpreter was used.   HPI Comments: Valerie Ayala is a 29 y.o. female, with a h/o thyroid disease, dysrhythmia, asthma, anxiety, Grave's disease, who presents to the Emergency Department complaining of constant, gradually worsening R upper dental pain over the past 3 days. Pt states that she has had intermittent pain for the past few weeks but was unable to see a dentist due to lack of insurance. She suspects that she has a new cavity. She reports associated gum swelling and R sided facial swelling over the past few days. Pt is a smoker.   Past Medical History  Diagnosis Date  . Thyroid disease   . Dysrhythmia     tachycardia  . Asthma   . H/O seasonal allergies   . Headache(784.0)     most days  . Anxiety     Takes xanax twice daily  . Complication of anesthesia   . PONV (postoperative nausea and vomiting)   . Pneumonia 11/02/11    "years ago"  . Grave's disease   . Chronic headaches     "multiple times a day; they say it's related to thyroid"   Past Surgical History  Procedure Laterality Date  . Wisdom tooth extraction  2004  . Total thyroidectomy  11/02/11  . Thyroidectomy  11/02/2011    Procedure: THYROIDECTOMY;  Surgeon: Serena Colonel, MD;  Location: [facility] OR;  Service: ENT;  Laterality: Bilateral;  TOTAL THYROIDECTOMY   Family History  Problem Relation Age of Onset  . Anesthesia problems Neg Hx   . Hypotension Neg Hx   . Malignant hyperthermia Neg Hx   . Pseudochol deficiency Neg Hx    History  Substance Use Topics  . Smoking status: Former Smoker -- 1.00 packs/day for 12 years  .  Smokeless tobacco: Former Neurosurgeon    Quit date: 10/19/2011  . Alcohol Use: 0.6 oz/week    1 Glasses of wine per week     Comment: socially glass of wine   OB History    No data available     Review of Systems  Constitutional: Negative for fever.  HENT: Positive for dental problem and facial swelling.    Allergies  Review of patient's allergies indicates no known allergies.  Home Medications   Prior to Admission medications   Medication Sig Start Date End Date Taking? Authorizing Provider  albuterol (PROVENTIL HFA;VENTOLIN HFA) 108 (90 BASE) MCG/ACT inhaler Inhale 2 puffs into the lungs every 6 (six) hours as needed. For shortness of breath    Historical Provider, MD  amoxicillin (AMOXIL) 500 MG capsule Take 1 capsule (500 mg total) by mouth 3 (three) times daily. 07/03/14   Arthor Captain, PA-C  calcitRIOL (ROCALTROL) 0.5 MCG capsule Take 1 capsule (0.5 mcg total) by mouth 2 (two) times daily. 11/14/13   [name], FNP  ciprofloxacin (CIPRO) 500 MG tablet Take 1 tablet (500 mg total) by mouth 2 (two) times daily. 12/19/13   [name], FNP  clonazePAM (KLONOPIN) 0.5 MG tablet TAKE ONE TABLET BY MOUTH TWICE DAILY AS NEEDED FOR ANXIETY 12/19/13   Mary-Margaret  Daphine DeutscherMartin, FNP  levothyroxine (SYNTHROID, LEVOTHROID) 150 MCG tablet TAKE ONE TABLET BY MOUTH ONCE DAILY 06/02/14   Mary-Margaret Daphine DeutscherMartin, FNP  oxyCODONE-acetaminophen (PERCOCET) 5-325 MG per tablet Take 1-2 tablets by mouth every 4 (four) hours as needed. 07/03/14   Arthor CaptainAbigail Briaunna Grindstaff, PA-C  varenicline (CHANTIX STARTING MONTH PAK) 0.5 MG X 11 & 1 MG X 42 tablet Take one 0.5 mg tablet by mouth once daily for 3 days, then increase to one 0.5 mg tablet twice daily for 4 days, then increase to one 1 mg tablet twice daily. 02/01/14   Mary-Margaret Daphine DeutscherMartin, FNP   BP 138/87 mmHg  Pulse 97  Temp(Src) 97.4 F (36.3 C) (Oral)  Resp 18  SpO2 98% Physical Exam  Constitutional: She is oriented to person, place, and time. She appears  well-developed and well-nourished. No distress.  HENT:  Head: Normocephalic and atraumatic.  Mouth/Throat: Uvula is midline and oropharynx is clear and moist.  Upper second molar on the R is painful to palpation. No gingival erythema or sign of infection.   Eyes: Conjunctivae and EOM are normal.  Neck: Neck supple.  Cardiovascular: Normal rate.   Pulmonary/Chest: Effort normal.  Musculoskeletal: Normal range of motion.  Neurological: She is alert and oriented to person, place, and time.  Skin: Skin is warm and dry.  Psychiatric: She has a normal mood and affect. Her behavior is normal.  Nursing note and vitals reviewed.  ED Course  Procedures (including critical care time) DIAGNOSTIC STUDIES: Oxygen Saturation is 98% on RA, normal by my interpretation.    COORDINATION OF CARE: 1:15 PM-Discussed treatment plan which includes Percocet and follow-up with dentist with pt at bedside and pt agreed to plan.   Labs Review Labs Reviewed - No data to display  Imaging Review No results found.   EKG Interpretation None      MDM   Final diagnoses:  Dentalgia    Patient with toothache.  No gross abscess.  Exam unconcerning for Ludwig's angina or spread of infection.  Will treat with penicillin and pain medicine.  Urged patient to follow-up with dentist.     I personally performed the services described in this documentation, which was scribed in my presence. The recorded information has been reviewed and is accurate.    Arthor Captainbigail Jeffrie Stander, PA-C 07/03/14 1351  Doug SouSam Jacubowitz, MD 07/03/14 [phone]

## 2014-07-03 NOTE — ED Notes (Signed)
Pt reports she called dentist and cannot see her today; was told to come here. Pt tearful. Upper right dental pain; no facial swelling.

## 2014-07-03 NOTE — Discharge Instructions (Signed)
You have been diagnosed with Dental pain. Please call the follow up dentist first thing in the morning on Monday for a follow up appointment. Keep your discharge paperwork from today's visit to bring to the dentist office. You may also use the resource guide listed below to help you find a dentist if you do not already have one to followup with. It is very important that you get evaluated by a dentist as soon as possible.  Use your pain medication as prescribed and do not operate heavy machinery while on pain medication. Note that your pain medication contains acetaminophen (Tylenol) & its is not reccommended that you use additional acetaminophen (Tylenol) while taking this medication. Take your full course of antibiotics. Read the instructions below. ° °Eat a soft or liquid diet and rinse your mouth out after meals with warm water. You should see a dentist or return here at once if you have increased swelling, increased pain or uncontrolled bleeding from the site of your injury. ° ° °SEEK MEDICAL CARE IF:  °· You have increased pain not controlled with medicines.  °· You have swelling around your tooth, in your face or neck.  °· You have bleeding which starts, continues, or gets worse.  °· You have a fever >101 °· If you are unable to open your mouth °Soft Diet  °The soft diet may be recommended after you were put on a full liquid diet. A normal diet may follow. The soft diet can also be used after surgery if you are too ill to keep down a normal diet. The soft diet may also be needed if you have a hard time chewing foods.  °DESCRIPTION  °Tender foods are used. Foods do not need to be ground or pureed. Most raw fruits and vegetables and coarse breads and cereals should be avoided. Fried foods and highly seasoned foods may cause discomfort.  °NUTRITIONAL ADEQUACY  °A healthy diet is possible if foods from each of the basic food groups are eaten daily.  °SOFT DIET FOOD LISTS  °Milk/Dairy  °Allowed: Milk and milk  drinks, milk shakes, cream cheese, cottage cheese, mild cheeses.  °Avoid: Sharp or highly seasoned cheese. °Meat/Meat Substitutes  °Allowed: Broiled, roasted, baked, or stewed tender lean beef, mutton, lamb, veal, chicken, turkey, liver, ham, crisp bacon, white fish, tuna, salmon. Eggs, smooth peanut butter.  °Avoid: All fried meats, fish, or fowl. Rich gravies and sauces. Lunch meats, sausages, hot dogs. Meats with gristle, chunky peanut butter. °Breads/Grains  °Allowed: Rice, noodles, spaghetti, macaroni. Dry or cooked refined cereals, such as farina, cream of wheat, oatmeal, grits, whole-wheat cereals. Plain or toasted white or wheat blend or whole-grain breads, soda crackers or saltines, flour tortillas.  °Avoid: Wild rice, coarse cereals, such as bran. Seed in or on breads and crackers. Bread or bread products with nuts or seeds. °Fruits/Vegetables  °Allowed: Fruit and vegetable juices, well-cooked or canned fruits and vegetables, any dried fruit. One citrus fruit daily, 1 vitamin A source daily. Well-ripened, easy to chew fruits, sweet potatoes. Baked, boiled, mashed, creamed, scalloped, or au gratin potatoes. Broths or creamed soups made with allowed vegetables, strained tomatoes.  °Avoid: All gas-forming vegetables (corn, radishes, Brussels sprouts, onions, broccoli, cabbage, parsnips, turnips, chili peppers, pinto beans, split peas, dried beans). Fruits containing seeds and skin. Potato chips and corn chips. All others that are not made with allowed vegetables. Highly seasoned soups. °Desserts/Sweets  °Allowed: Simple desserts, such as custard, junkets, gelatin desserts, plain ice cream and sherbets, simple cakes   and cookies, allowed fruits, sugar, syrup, jelly, honey, plain hard candy, and molasses.  °Avoid: Rich pastries, any dessert containing dates, nuts, raisins, or coconut. Fried pastries, such as doughnuts. Chocolate. °Beverages  °Allowed: Fruit and vegetable juices. Caffeine-free carbonated drinks,  coffee, and tea.  °Avoid: Caffeinated beverages: coffee, tea, soda or pop. °Miscellaneous  °Allowed: Butter, cream, margarine, mayonnaise, oil. Cream sauces, salt, and mild spices.  °Avoid: Highly spiced salad dressings. Highly seasoned foods, hot sauce, mustard, horseradish, and pepper. °SAMPLE MENU  °Breakfast  °Orange juice.  °Oatmeal.  °Soft cooked egg.  °Toast and margarine.  °2% milk.  °Coffee. °Lunch  °Meatloaf.  °Mashed potato.  °Green beans.  °Lemon pudding.  °Bread and margarine.  °Coffee. °Dinner  °Consommé or apricot nectar.  °Chicken breast.  °Rice, peas, and carrots.  °Applesauce.  °Bread and margarine.  °2% milk. °To cut the amount of fat in your diet, omit margarine and use 1% or skim milk.  °NUTRIENT ANALYSIS  °Calories........................1953 Kcal.  °Protein.........................102 gm.  °Carbohydrate...............247 gm.  °Fat................................65 gm.  °Cholesterol...................449 mg.  °Dietary fiber.................19 gm.  °Vitamin A.....................2944 RE.  °Vitamin C.....................79 mg.  °Niacin..........................25 mg.  °Riboflavin....................2.0 mg.  °Thiamin.......................1.5 mg.  °Folate..........................249 mcg.  °Calcium.......................1030 mg.  °Phosphorus.................1782 mg.  °Zinc..............................12 mg.  °Iron..............................13 mg.  °Sodium.........................299 mg.  °Potassium....................3046 mg. °Document Released: 09/20/2007 Document Revised: 09/05/2011 Document Reviewed: 09/20/2007  °ExitCare® Patient Information ©2014 [employer].  ° °RESOURCE GUIDE ° ° °Dental Problems ° °Dr. Janna Civilis °$200 dollar visit °[address] °[place], [place] [address]  °[phone] °  ° °Patients with Medicaid: °[facility]                     [facility] °[address]                                           [address] °Phone:   [phone]                                                  Phone:  510-2600 ° °If unable to pay or uninsured, contact:  [facility] or [facility] to become qualified for the adult dental clinic. ° °Chronic Pain Problems °Contact [facility]  [phone] °Patients need to be referred by their primary care doctor. ° °Insufficient Money for Medicine °Contact [employer]:  call "211" or [facility] 271-5999. ° °No Primary Care Doctor °Call [facility]  832-8000 °Other agencies that provide inexpensive medical care °   [facility]  [phone] °   [facility]  [phone] °   [facility]  [phone] °   [facility]  832-4777 °   [facility]  [phone] °   [facility]  [phone] ° °Psychological Services °[facility]  [phone] °[facility]  [phone] °[facility]   [phone] (emergency services 641-4993) ° °Substance Abuse Resources °[facility]  336-882-2125 °[facility] 336-784-9470 °[facility] 336-285-9073 °[facility] 336-845-3988 °Residential & Outpatient Substance Abuse Program  800-659-3381 ° °Abuse/Neglect °[place] [facility] (336) 641-3795 °[place] [facility] Hotline 800-378-5315 (After Hours) ° °Emergency Shelter °   [facility] (336) 271-5985 ° °Maternity Homes °[facility] (336) 275-9566 °[facility] (704) 372-4663 ° °MRSA Hotline #:   832-7006 ° ° ° °[place] Resources ° °[facility] [facility]     [employer]                          [facility] °[address] [place]                       [address]      [address]  °[place]                                                [place]                            [place] °Phone:  349-3220                                   Phone:  [phone]                 Phone:   342-8140 ° °[facility] °Phone:  342-8316 ° °[place] [facility] Abuse Hotline °(336) 342-1394 °(336) 342-3537 (After Hours) ° ° ° ° ° ° °

## 2014-07-03 NOTE — ED Notes (Signed)
Pt comfortable with discharge and follow up instructions. Pt declines wheelchair, escorted to waiting area by this RN. Prescriptions x2. 

## 2014-07-07 ENCOUNTER — Telehealth: Payer: Self-pay | Admitting: Nurse Practitioner

## 2014-07-07 MED ORDER — OXYCODONE-ACETAMINOPHEN 5-325 MG PO TABS: 1.0000 | ORAL_TABLET | ORAL | Status: DC | PRN

## 2014-07-07 NOTE — Telephone Encounter (Signed)
Pt went to ER 1/7 for abscess teeth, they gave her anbx and pain meds, she has ran out of pain meds. She has an appt with her Dentist this Wednesday and then you later in the afternoon on Wednesday. Is there anyway you can give her a rx for pain? Please advice

## 2014-07-07 NOTE — Telephone Encounter (Signed)
Pt aware Rx ready 

## 2014-07-07 NOTE — Telephone Encounter (Signed)
Oxycodone rx ready for pick up  

## 2014-07-09 ENCOUNTER — Ambulatory Visit (INDEPENDENT_AMBULATORY_CARE_PROVIDER_SITE_OTHER): Payer: 59 | Admitting: Nurse Practitioner

## 2014-07-09 ENCOUNTER — Encounter: Payer: Self-pay | Admitting: Nurse Practitioner

## 2014-07-09 VITALS — BP 122/79 | HR 83 | Temp 96.6°F | Ht 68.0 in | Wt 139.0 lb

## 2014-07-09 DIAGNOSIS — F900 Attention-deficit hyperactivity disorder, predominantly inattentive type: Secondary | ICD-10-CM | POA: Diagnosis not present

## 2014-07-09 DIAGNOSIS — J0100 Acute maxillary sinusitis, unspecified: Secondary | ICD-10-CM

## 2014-07-09 DIAGNOSIS — E039 Hypothyroidism, unspecified: Secondary | ICD-10-CM | POA: Diagnosis not present

## 2014-07-09 DIAGNOSIS — F909 Attention-deficit hyperactivity disorder, unspecified type: Secondary | ICD-10-CM

## 2014-07-09 MED ORDER — LISDEXAMFETAMINE DIMESYLATE 50 MG PO CAPS: 50.0000 mg | ORAL_CAPSULE | ORAL | Status: DC

## 2014-07-09 MED ORDER — AZITHROMYCIN 250 MG PO TABS: ORAL_TABLET | ORAL | Status: DC

## 2014-07-09 MED ORDER — CALCITRIOL 0.5 MCG PO CAPS: 0.5000 ug | ORAL_CAPSULE | Freq: Two times a day (BID) | ORAL | Status: DC

## 2014-07-09 MED ORDER — LEVOTHYROXINE SODIUM 150 MCG PO TABS: 150.0000 ug | ORAL_TABLET | Freq: Every day | ORAL | Status: DC

## 2014-07-09 NOTE — Patient Instructions (Signed)

## 2014-07-09 NOTE — Progress Notes (Signed)
Subjective:    Patient ID: Valerie Ayala, female    DOB: 16-Jul-1985, 29 y.o.   MRN: [id number]  HPIPatient here today with several complaints: - Sinus congestion with facial pain- has sore in her left nostril- no fever- no cough- Has tried no OTC meds. - Hypothyroidism- on levothyroxine patient has been out for a few daysand has been taking her dads meds which were a higher dose. - hypocalcemia- has been without calcitrol for a couple of months - Wants to discuss ADHD- thinks she has always been hyper- parents would not let her be tested when she was young.  1. Fidgeting 3 2. Does not seem to listen to what is being said to him/her 3 3 .Doesn't pay attention to details; makes careless mistakes 3 4. Inattentative, easily distracted. 3 5. Has trouble organizing tasks or activities 2 6. Gives up easily on difficult tasks.3 7. Fidgets or squirms in seat 3 8. Restless or overactive 2 9. Is easily distracted by sights and sounds 3 10. Interrupts others 3  SCORE 28 Probability 99%     Review of Systems  Constitutional: Negative for fever, chills and appetite change.  HENT: Positive for congestion, rhinorrhea and sinus pressure.   Respiratory: Positive for cough.   Cardiovascular: Negative.   Gastrointestinal: Negative.   Genitourinary: Negative.   Neurological: Negative.   Psychiatric/Behavioral: Negative.   All other systems reviewed and are negative.      Objective:   Physical Exam  Constitutional: She is oriented to person, place, and time. She appears well-developed and well-nourished. No distress.  HENT:  Right Ear: Hearing, tympanic membrane, external ear and ear canal normal.  Left Ear: Hearing, tympanic membrane, external ear and ear canal normal.  Nose: Mucosal edema and rhinorrhea present. Right sinus exhibits maxillary sinus tenderness. Right sinus exhibits no frontal sinus tenderness. Left sinus exhibits maxillary sinus tenderness. Left sinus exhibits no frontal  sinus tenderness.  Mouth/Throat: Uvula is midline, oropharynx is clear and moist and mucous membranes are normal.  Cardiovascular: Normal rate, regular rhythm and normal heart sounds.   Pulmonary/Chest: Effort normal and breath sounds normal.  Abdominal: Soft. Bowel sounds are normal.  Neurological: She is alert and oriented to person, place, and time. She has normal reflexes.  Skin: Skin is warm.  Psychiatric: She has a normal mood and affect. Her behavior is normal. Judgment and thought content normal.  figidity talkative   BP 122/79 mmHg  Pulse 83  Temp(Src) 96.6 F (35.9 C) (Oral)  Ht _0  (1.727 m)  Wt 139 lb (63.05 kg)  BMI 21.14 kg/m2        Assessment & Plan:  1. Acute maxillary sinusitis, recurrence not specified 1. Take meds as prescribed 2. Use a cool mist humidifier especially during the winter months and when heat has been humid. 3. Use saline nose sprays frequently 4. Saline irrigations of the nose can be very helpful if done frequently.  * 4X daily for 1 week*  * Use of a nettie pot can be helpful with this. Follow directions with this* 5. Drink plenty of fluids 6. Keep thermostat turn down low 7.For any cough or congestion  Use plain Mucinex- regular strength or max strength is fine   * Children- consult with Pharmacist for dosing 8. For fever or aces or pains- take tylenol or ibuprofen appropriate for age and weight.  * for fevers greater than 101 orally you may alternate ibuprofen and tylenol every  3 hours.   -  azithromycin (ZITHROMAX Z-PAK) 250 MG tablet; As directed  Dispense: 6 each; Refill: 0  2. Adult ADHD Stress management - lisdexamfetamine (VYVANSE) 50 MG capsule; Take 1 capsule (50 mg total) by mouth every morning.  Dispense: 30 capsule; Refill: 0 - lisdexamfetamine (VYVANSE) 50 MG capsule; Take 1 capsule (50 mg total) by mouth every morning.  Dispense: 30 capsule; Refill: 0  3. Hypothyroidism, unspecified hypothyroidism type - levothyroxine  (SYNTHROID, LEVOTHROID) 150 MCG tablet; Take 1 tablet (150 mcg total) by mouth daily.  Dispense: 30 tablet; Refill: 5 - Thyroid Panel With TSH  4. Hypocalcemia - CMP14+EGFR - calcitRIOL (ROCALTROL) 0.5 MCG capsule; Take 1 capsule (0.5 mcg total) by mouth 2 (two) times daily.  Dispense: 60 capsule; Refill: 5    Labs pending Health maintenance reviewed Diet and exercise encouraged Continue all meds Follow up  In 2 months   [place], FNP

## 2014-07-10 ENCOUNTER — Telehealth: Payer: Self-pay | Admitting: *Deleted

## 2014-07-10 LAB — CMP14+EGFR
ALBUMIN: 4.7 g/dL (ref 3.5–5.5)
ALK PHOS: 38 IU/L — AB (ref 39–117)
ALT: 6 IU/L (ref 0–32)
AST: 9 IU/L (ref 0–40)
Albumin/Globulin Ratio: 2.2 (ref 1.1–2.5)
BUN/Creatinine Ratio: 14 (ref 8–20)
BUN: 11 mg/dL (ref 6–20)
CO2: 25 mmol/L (ref 18–29)
CREATININE: 0.8 mg/dL (ref 0.57–1.00)
Calcium: 7.7 mg/dL — ABNORMAL LOW (ref 8.7–10.2)
Chloride: 100 mmol/L (ref 97–108)
GFR calc Af Amer: 114 mL/min/{1.73_m2} (ref >59)
GFR calc non Af Amer: 99 mL/min/{1.73_m2} (ref >59)
Globulin, Total: 2.1 g/dL (ref 1.5–4.5)
Glucose: 69 mg/dL (ref 65–99)
POTASSIUM: 3.9 mmol/L (ref 3.5–5.2)
Sodium: 142 mmol/L (ref 134–144)
Total Bilirubin: 0.6 mg/dL (ref 0.0–1.2)
Total Protein: 6.8 g/dL (ref 6.0–8.5)

## 2014-07-10 LAB — THYROID PANEL WITH TSH
Free Thyroxine Index: 3.3 (ref 1.2–4.9)
T3 Uptake Ratio: 28 % (ref 24–39)
T4, Total: 11.7 ug/dL (ref 4.5–12.0)
TSH: 1.15 u[IU]/mL (ref 0.450–4.500)

## 2014-07-10 NOTE — Telephone Encounter (Signed)
-----   Message from [facility] Martin, FNP sent at 07/10/2014  9:56 AM EST ----- Kidney and liver function stable Calcium is actually low- back on calcitrol TSH within normal limits Continue current meds- low fat diet and exercise and recheck in 3 months

## 2014-07-11 NOTE — Telephone Encounter (Signed)
Patient aware of results.

## 2014-08-08 ENCOUNTER — Telehealth: Payer: Self-pay | Admitting: *Deleted

## 2014-08-08 NOTE — Telephone Encounter (Signed)
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 Is there anything that can be added to help with this?

## 2014-08-08 NOTE — Telephone Encounter (Signed)
Samples of Viibryd given per Valerie PieriniMary Margaret Ayala. Also would like a refill on her klonopin. Please advise

## 2014-08-08 NOTE — Telephone Encounter (Signed)
Please call in klonopin with 1 refills 

## 2014-08-08 NOTE — Telephone Encounter (Signed)
rx called into pharmacy

## 2014-08-19 ENCOUNTER — Other Ambulatory Visit: Payer: Self-pay | Admitting: Nurse Practitioner

## 2014-08-19 MED ORDER — VILAZODONE HCL 20 MG PO TABS: 1.0000 | ORAL_TABLET | Freq: Every day | ORAL | Status: DC

## 2014-08-19 MED ORDER — METHYLPHENIDATE HCL ER (OSM) 36 MG PO TBCR: 36.0000 mg | EXTENDED_RELEASE_TABLET | Freq: Every day | ORAL | Status: DC

## 2014-08-19 MED ORDER — VALACYCLOVIR HCL 1 G PO TABS: 1000.0000 mg | ORAL_TABLET | Freq: Two times a day (BID) | ORAL | Status: DC

## 2014-09-08 ENCOUNTER — Other Ambulatory Visit: Payer: Self-pay | Admitting: *Deleted

## 2014-09-08 DIAGNOSIS — J329 Chronic sinusitis, unspecified: Secondary | ICD-10-CM

## 2014-09-14 ENCOUNTER — Emergency Department (HOSPITAL_COMMUNITY): Payer: 59

## 2014-09-14 ENCOUNTER — Emergency Department (HOSPITAL_COMMUNITY)
Admission: EM | Admit: 2014-09-14 | Discharge: 2014-09-14 | Disposition: A | Discharge status: Home / Self Care | Payer: 59 | Attending: Emergency Medicine | Admitting: Emergency Medicine

## 2014-09-14 ENCOUNTER — Encounter (HOSPITAL_COMMUNITY): Payer: Self-pay | Admitting: Nurse Practitioner

## 2014-09-14 DIAGNOSIS — Z79899 Other long term (current) drug therapy: Secondary | ICD-10-CM | POA: Insufficient documentation

## 2014-09-14 DIAGNOSIS — Z87891 Personal history of nicotine dependence: Secondary | ICD-10-CM | POA: Insufficient documentation

## 2014-09-14 DIAGNOSIS — Z8701 Personal history of pneumonia (recurrent): Secondary | ICD-10-CM | POA: Insufficient documentation

## 2014-09-14 DIAGNOSIS — F419 Anxiety disorder, unspecified: Secondary | ICD-10-CM | POA: Insufficient documentation

## 2014-09-14 DIAGNOSIS — G8929 Other chronic pain: Secondary | ICD-10-CM | POA: Insufficient documentation

## 2014-09-14 DIAGNOSIS — G43909 Migraine, unspecified, not intractable, without status migrainosus: Secondary | ICD-10-CM

## 2014-09-14 DIAGNOSIS — J45909 Unspecified asthma, uncomplicated: Secondary | ICD-10-CM | POA: Insufficient documentation

## 2014-09-14 DIAGNOSIS — E079 Disorder of thyroid, unspecified: Secondary | ICD-10-CM | POA: Insufficient documentation

## 2014-09-14 DIAGNOSIS — Z792 Long term (current) use of antibiotics: Secondary | ICD-10-CM | POA: Insufficient documentation

## 2014-09-14 MED ORDER — METOCLOPRAMIDE HCL 5 MG/ML IJ SOLN
10.0000 mg | INTRAMUSCULAR | Status: AC
  Administered 2014-09-14: 10 mg via INTRAVENOUS
  Filled 2014-09-14: qty 2

## 2014-09-14 MED ORDER — KETOROLAC TROMETHAMINE 30 MG/ML IJ SOLN
30.0000 mg | Freq: Once | INTRAMUSCULAR | Status: AC
  Administered 2014-09-14: 30 mg via INTRAVENOUS
  Filled 2014-09-14: qty 1

## 2014-09-14 MED ORDER — DIPHENHYDRAMINE HCL 50 MG/ML IJ SOLN
12.5000 mg | Freq: Once | INTRAMUSCULAR | Status: AC
  Administered 2014-09-14: 12.5 mg via INTRAVENOUS
  Filled 2014-09-14: qty 1

## 2014-09-14 MED ORDER — NAPROXEN 500 MG PO TABS: 500.0000 mg | ORAL_TABLET | Freq: Two times a day (BID) | ORAL | Status: DC

## 2014-09-14 MED ORDER — PROCHLORPERAZINE EDISYLATE 5 MG/ML IJ SOLN
10.0000 mg | Freq: Once | INTRAMUSCULAR | Status: AC
  Administered 2014-09-14: 10 mg via INTRAVENOUS
  Filled 2014-09-14: qty 2

## 2014-09-14 NOTE — ED Notes (Signed)
Pt remarks on hx of migraine although she states the pain she has feels different, also states she has been having sinus pain for the last 3 months with abx therapy not helping. States she has appt with an ENT this coming Wednesday but cannot wait for this long, wishes to have a head CT done today.

## 2014-09-14 NOTE — ED Provider Notes (Signed)
CSN: [id number]     Arrival date & time 09/14/14  1449 History   First MD Initiated Contact with Patient 09/14/14 1741     Chief Complaint  Patient presents with  . Migraine  . Facial Pain    (Consider location/radiation/quality/duration/timing/severity/associated sxs/prior Treatment) HPI Comments: Patient is a 29 year old female with a history of asthma, seasonal allergies, Graves' disease, and chronic headaches. She presents to the emergency department for further evaluation of headaches which she describes as "different from my migraine headaches". Patient states that she has been experiencing right-sided headaches which she describes as pressure-like and throbbing. They have been intermittent over the past 3 months. Patient states that they usually are constant for a few days and then spontaneously resolved. She feels the pain in her right "face, gums, nose, and right eye". She has been to see her primary care doctor who placed her on multiple courses of antibiotics for sinusitis symptoms. This has provided her no relief. Symptoms associated with photophobia in her right eye, nausea with sporadic emesis, and blurry vision in her right eye. She also notes some clear right eye tearing. She states that her normal headaches are usually present in her temples. She denies associated fever, extremity numbness/weakness, history of head injury or trauma, vision loss, difficulty speaking or swallowing, or syncope.  Patient is a 29 y.o. female presenting with migraines. The history is provided by the patient. No language interpreter was used.  Migraine Associated symptoms include headaches, nausea and vomiting. Pertinent negatives include no fever, numbness or weakness.    Past Medical History  Diagnosis Date  . Thyroid disease   . Dysrhythmia     tachycardia  . Asthma   . H/O seasonal allergies   . Headache(784.0)     most days  . Anxiety     Takes xanax twice daily  . Complication of  anesthesia   . PONV (postoperative nausea and vomiting)   . Pneumonia 11/02/11    "years ago"  . Grave's disease   . Chronic headaches     "multiple times a day; they say it's related to thyroid"   Past Surgical History  Procedure Laterality Date  . Wisdom tooth extraction  2004  . Total thyroidectomy  11/02/11  . Thyroidectomy  11/02/2011    Procedure: THYROIDECTOMY;  Surgeon: Serena Colonel, MD;  Location: [facility] OR;  Service: ENT;  Laterality: Bilateral;  TOTAL THYROIDECTOMY   Family History  Problem Relation Age of Onset  . Anesthesia problems Neg Hx   . Hypotension Neg Hx   . Malignant hyperthermia Neg Hx   . Pseudochol deficiency Neg Hx    History  Substance Use Topics  . Smoking status: Former Smoker -- 1.00 packs/day for 12 years  . Smokeless tobacco: Former Neurosurgeon    Quit date: 10/19/2011  . Alcohol Use: 0.6 oz/week    1 Glasses of wine per week     Comment: socially glass of wine   OB History    No data available      Review of Systems  Constitutional: Negative for fever.  Eyes: Positive for photophobia and visual disturbance.  Gastrointestinal: Positive for nausea and vomiting.  Neurological: Positive for headaches. Negative for syncope, weakness and numbness.  All other systems reviewed and are negative.   Allergies  Review of patient's allergies indicates no known allergies.  Home Medications   Prior to Admission medications   Medication Sig Start Date End Date Taking? Authorizing Provider  albuterol (PROVENTIL HFA;VENTOLIN HFA) 108 (  90 BASE) MCG/ACT inhaler Inhale 2 puffs into the lungs every 6 (six) hours as needed. For shortness of breath   Yes Historical Provider, MD  Ascorbic Acid (VITAMIN C ER PO) Take 1 tablet by mouth daily.   Yes Historical Provider, MD  calcitRIOL (ROCALTROL) 0.5 MCG capsule Take 1 capsule (0.5 mcg total) by mouth 2 (two) times daily. 07/09/14  Yes [name], FNP  cholecalciferol (VITAMIN D) 1000 UNITS tablet Take 1,000 Units by  mouth daily.   Yes Historical Provider, MD  clonazePAM (KLONOPIN) 0.5 MG tablet TAKE ONE TABLET BY MOUTH TWICE DAILY AS NEEDED FOR ANXIETY 12/19/13  Yes [name], FNP  ibuprofen (ADVIL,MOTRIN) 200 MG tablet Take 40 mg by mouth every 6 (six) hours as needed for moderate pain.   Yes Historical Provider, MD  levothyroxine (SYNTHROID, LEVOTHROID) 150 MCG tablet Take 1 tablet (150 mcg total) by mouth daily. 07/09/14  Yes [name], FNP  methylphenidate 36 MG PO CR tablet Take 1 tablet (36 mg total) by mouth daily. 08/19/14  Yes [name], FNP  Vilazodone HCl (VIIBRYD) 20 MG TABS Take 1 tablet (20 mg total) by mouth daily. 08/19/14  Yes [name], FNP  azithromycin (ZITHROMAX Z-PAK) 250 MG tablet As directed Patient not taking: Reported on 09/14/2014 07/09/14   [name], FNP  oxyCODONE-acetaminophen (PERCOCET) 5-325 MG per tablet Take 1-2 tablets by mouth every 4 (four) hours as needed. Patient not taking: Reported on 09/14/2014 07/07/14   [name], FNP  valACYclovir (VALTREX) 1000 MG tablet Take 1 tablet (1,000 mg total) by mouth 2 (two) times daily. Patient not taking: Reported on 09/14/2014 08/19/14   [name], FNP  varenicline (CHANTIX STARTING MONTH PAK) 0.5 MG X 11 & 1 MG X 42 tablet Take one 0.5 mg tablet by mouth once daily for 3 days, then increase to one 0.5 mg tablet twice daily for 4 days, then increase to one 1 mg tablet twice daily. Patient not taking: Reported on 09/14/2014 02/01/14   [name], FNP   BP 124/76 mmHg  Pulse 69  Temp(Src) 98.8 F (37.1 C) (Oral)  Resp 18  SpO2 99%   Physical Exam  Constitutional: She is oriented to person, place, and time. She appears well-developed and well-nourished. No distress.  Nontoxic/nonseptic appearing  HENT:  Head: Normocephalic and atraumatic.  Mouth/Throat: Oropharynx is clear and moist. No oropharyngeal exudate.  Symmetric rise of the uvula with phonation.  No hemotympanum bilaterally. No middle ear effusion.  Eyes: Conjunctivae and EOM are normal. Pupils are equal, round, and reactive to light. No scleral icterus.  EOMs normal without nystagmus  Neck: Normal range of motion.  No nuchal rigidity or meningismus  Cardiovascular: Normal rate, regular rhythm and intact distal pulses.   Pulmonary/Chest: Effort normal. No respiratory distress.  Respirations even and unlabored  Musculoskeletal: Normal range of motion.  Neurological: She is alert and oriented to person, place, and time. No cranial nerve deficit. She exhibits normal muscle tone. Coordination normal.  GCS 15. Speech is goal oriented. No cranial nerve deficits appreciated; symmetric eyebrow raise, no facial drooping, tongue midline. Patient has equal grip strength bilaterally and 5/5 strength against resistance in all major muscle groups bilaterally. Sensation to light touch intact in all extremities. Patient moves extremities without ataxia; normal finger-nose-finger. Patient ambulatory in the ED with steady gait.  Skin: Skin is warm and dry. No rash noted. She is not diaphoretic. No erythema. No pallor.  Psychiatric: She has a normal mood and affect. Her behavior  is normal.  Nursing note and vitals reviewed.   ED Course  Procedures (including critical care time) Labs Review Labs Reviewed - No data to display  Imaging Review Ct Head Wo Contrast  09/14/2014   CLINICAL DATA:  Acute onset of headache. Sinus pain for 3 months. Initial encounter.  EXAM: CT HEAD WITHOUT CONTRAST  TECHNIQUE: Contiguous axial images were obtained from the base of the skull through the vertex without intravenous contrast.  COMPARISON:  CT of the neck performed 10/19/2011  FINDINGS: There is no evidence of acute infarction, mass lesion, or intra- or extra-axial hemorrhage on CT.  The posterior fossa, including the cerebellum, brainstem and fourth ventricle, is within normal limits. The third and lateral ventricles,  and basal ganglia are unremarkable in appearance. The cerebral hemispheres are symmetric in appearance, with normal gray-white differentiation. No mass effect or midline shift is seen.  There is no evidence of fracture; visualized osseous structures are unremarkable in appearance. The visualized portions of the orbits are within normal limits. The paranasal sinuses and mastoid air cells are well-aerated. No significant soft tissue abnormalities are seen.  IMPRESSION: Unremarkable noncontrast CT of the head.   Electronically Signed   By: Roanna RaiderJeffery  Chang M.D.   On: 09/14/2014 18:59     EKG Interpretation None      MDM   Final diagnoses:  Migraine without status migrainosus, not intractable, unspecified migraine type    29 year old female persisted the emergency department for further evaluation of 3 months of an intermittent right-sided headache. No history of head injury or trauma. No focal neurologic exam with normal head CT. Doubt meningitis or other emergent intracranial process, especially given chronicity of symptoms. Pain improved with Compazine, Benadryl, Toradol, and Reglan. Will d/c with Neurology f/u. Return precautions given. Patient discharged in good condition with no unaddressed concerns.   Filed Vitals:   09/14/14 1526 09/14/14 1829  BP: 131/71 124/76  Pulse: 78 69  Temp: 98.7 F (37.1 C) 98.8 F (37.1 C)  TempSrc: Oral Oral  Resp: 17 18  SpO2: 100% 99%       Antony MaduraKelly Antonia Culbertson, PA-C 09/14/14 2039  Linwood DibblesJon Knapp, MD 09/15/14 [phone]

## 2014-09-14 NOTE — Discharge Instructions (Signed)

## 2014-09-24 ENCOUNTER — Other Ambulatory Visit: Payer: Self-pay | Admitting: *Deleted

## 2014-09-24 ENCOUNTER — Telehealth: Payer: Self-pay | Admitting: *Deleted

## 2014-09-24 MED ORDER — METHYLPHENIDATE HCL ER (OSM) 36 MG PO TBCR: 36.0000 mg | EXTENDED_RELEASE_TABLET | Freq: Every day | ORAL | Status: DC

## 2014-09-24 NOTE — Telephone Encounter (Signed)
Patient last seen in office on 07-09-14. Rx last filled on 08-19-14. Please advise

## 2014-09-24 NOTE — Telephone Encounter (Signed)
Methylphenidate rx ready for pick up   

## 2014-09-24 NOTE — Telephone Encounter (Signed)
Samples of Viibryd given. 2 Packs. Lot #[id number]. Exp 12-17.

## 2014-10-20 ENCOUNTER — Telehealth: Payer: Self-pay | Admitting: *Deleted

## 2014-10-20 MED ORDER — CLINDAMYCIN HCL 300 MG PO CAPS: 300.0000 mg | ORAL_CAPSULE | Freq: Four times a day (QID) | ORAL | Status: DC

## 2014-10-20 NOTE — Telephone Encounter (Signed)
[facility] called [facility]

## 2014-10-20 NOTE — Telephone Encounter (Signed)
Pt aware Rx sent to pharmacy 

## 2014-10-20 NOTE — Telephone Encounter (Signed)
Pt had root canal done the mold came out and pt thinks she has infection and it is quite painful, she has an appt this Thursday with her dentist and they will not give her anything until she is seen. Can we give her amoxicillin and something for pain? Please advise.

## 2014-10-21 ENCOUNTER — Telehealth: Payer: Self-pay | Admitting: *Deleted

## 2014-10-21 NOTE — Telephone Encounter (Signed)
Patient has been taking Viibryd with good results but she was receiving samples because she doesn't have insurance coverage. We do not have samples right now and she is out of medication. Can you prescribe something that is reasonably priced? Maybe on $4 list at [facility]. I didn't see anything very similar to Viibryd but citalopram, paroxetine and fluoxetine are all on the list.   She is also wanting to know if there is a reasonably priced ADD medication that you're willing to prescribe. She is starting back to school this summer and has been doing well with treatment.

## 2014-10-22 ENCOUNTER — Other Ambulatory Visit: Payer: Self-pay | Admitting: Nurse Practitioner

## 2014-10-22 MED ORDER — CITALOPRAM HYDROBROMIDE 20 MG PO TABS: 20.0000 mg | ORAL_TABLET | Freq: Every day | ORAL | Status: DC

## 2014-10-22 NOTE — Telephone Encounter (Signed)
celexa rx sent to pharmacy- not sure if can afford ADHD medds with no insurance

## 2014-10-23 ENCOUNTER — Other Ambulatory Visit: Payer: Self-pay | Admitting: Nurse Practitioner

## 2014-10-23 MED ORDER — AMPHETAMINE-DEXTROAMPHETAMINE 20 MG PO TABS: 20.0000 mg | ORAL_TABLET | Freq: Two times a day (BID) | ORAL | Status: DC

## 2014-10-23 MED ORDER — VILAZODONE HCL 20 MG PO TABS: 1.0000 | ORAL_TABLET | Freq: Every day | ORAL | Status: DC

## 2014-11-04 ENCOUNTER — Ambulatory Visit (INDEPENDENT_AMBULATORY_CARE_PROVIDER_SITE_OTHER): Payer: Self-pay | Admitting: Family Medicine

## 2014-11-04 ENCOUNTER — Other Ambulatory Visit: Payer: Self-pay | Admitting: *Deleted

## 2014-11-04 ENCOUNTER — Encounter: Payer: Self-pay | Admitting: Family Medicine

## 2014-11-04 VITALS — BP 129/77 | HR 93 | Temp 97.4°F | Ht 68.0 in | Wt 136.6 lb

## 2014-11-04 DIAGNOSIS — G4485 Primary stabbing headache: Secondary | ICD-10-CM

## 2014-11-04 MED ORDER — CARBAMAZEPINE ER 100 MG PO CP12
100.0000 mg | ORAL_CAPSULE | Freq: Two times a day (BID) | ORAL | Status: DC
Start: 2014-11-04 — End: 2015-01-27

## 2014-11-04 MED ORDER — CARBAMAZEPINE 100 MG PO CHEW: 100.0000 mg | CHEWABLE_TABLET | Freq: Two times a day (BID) | ORAL | Status: DC

## 2014-11-04 NOTE — Progress Notes (Signed)
Subjective:    Patient ID: Valerie Ayala, female    DOB: Oct 22, 1985, [age] y.o.   MRN: [id number]  HPI 29 year old female here with right-sided face and head pain. Symptoms of been present for about a month she has had CT scan which was negative for mass. There is no history of trauma or zoster and sinuses were clear. Pain is described as constant but sometimes is worse and sometimes relieved by hot showers or warm compresses. There is tenderness to touch on the right side of her head and face.  Patient Active Problem List   Diagnosis Date Noted  . Asthma 05/07/2013  . Hypothyroidism 11/26/2012  . GAD (generalized anxiety disorder) 11/26/2012   Outpatient Encounter Prescriptions as of 11/04/2014  Medication Sig  . albuterol (PROVENTIL HFA;VENTOLIN HFA) 108 (90 BASE) MCG/ACT inhaler Inhale 2 puffs into the lungs every 6 (six) hours as needed. For shortness of breath  . amphetamine-dextroamphetamine (ADDERALL) 20 MG tablet Take 1 tablet (20 mg total) by mouth 2 (two) times daily.  . Ascorbic Acid (VITAMIN C ER PO) Take 1 tablet by mouth daily.  Marland Kitchen. azithromycin (ZITHROMAX Z-PAK) 250 MG tablet As directed  . calcitRIOL (ROCALTROL) 0.5 MCG capsule Take 1 capsule (0.5 mcg total) by mouth 2 (two) times daily.  . cholecalciferol (VITAMIN D) 1000 UNITS tablet Take 1,000 Units by mouth daily.  . citalopram (CELEXA) 20 MG tablet Take 1 tablet (20 mg total) by mouth daily.  . clindamycin (CLEOCIN) 300 MG capsule Take 1 capsule (300 mg total) by mouth 4 (four) times daily.  . clonazePAM (KLONOPIN) 0.5 MG tablet TAKE ONE TABLET BY MOUTH TWICE DAILY AS NEEDED FOR ANXIETY  . ibuprofen (ADVIL,MOTRIN) 200 MG tablet Take 40 mg by mouth every 6 (six) hours as needed for moderate pain.  Marland Kitchen. levothyroxine (SYNTHROID, LEVOTHROID) 150 MCG tablet Take 1 tablet (150 mcg total) by mouth daily.  . methylphenidate 36 MG PO CR tablet Take 1 tablet (36 mg total) by mouth daily.  . naproxen (NAPROSYN) 500 MG tablet Take 1  tablet (500 mg total) by mouth 2 (two) times daily.  Marland Kitchen. oxyCODONE-acetaminophen (PERCOCET) 5-325 MG per tablet Take 1-2 tablets by mouth every 4 (four) hours as needed.  . valACYclovir (VALTREX) 1000 MG tablet Take 1 tablet (1,000 mg total) by mouth 2 (two) times daily.  . varenicline (CHANTIX STARTING MONTH PAK) 0.5 MG X 11 & 1 MG X 42 tablet Take one 0.5 mg tablet by mouth once daily for 3 days, then increase to one 0.5 mg tablet twice daily for 4 days, then increase to one 1 mg tablet twice daily.  . Vilazodone HCl (VIIBRYD) 20 MG TABS Take 1 tablet (20 mg total) by mouth daily.   No facility-administered encounter medications on file as of 11/04/2014.      Review of Systems  Constitutional: Negative.   Eyes: Negative.   Respiratory: Negative.   Cardiovascular: Negative.   Gastrointestinal: Negative.   Endocrine: Negative.   Genitourinary: Negative.   Neurological: Positive for headaches.  Hematological: Negative.   Psychiatric/Behavioral: Negative.        Objective:   Physical Exam  Constitutional: She is oriented to person, place, and time. She appears well-developed and well-nourished.  HENT:  Head: Normocephalic and atraumatic.  Right Ear: External ear normal.  Neurological: She is alert and oriented to person, place, and time. No cranial nerve deficit.          Assessment & Plan:   1. Primary stabbing headache  Symptoms and physical exam are suggestive of trigeminal neuralgia. Although MRI is more sensitive than CT to rule out mass lesion on cranial nerve will treat expectantly. Begin Tegretol 100 mg twice a day and titrate. Patient is to follow-up by telephone since she is having to pay per visit  Frederica KusterStephen M Miller MD

## 2014-11-04 NOTE — Addendum Note (Signed)
Addended by: Fawn KirkHOLT, CATHY on: 11/04/2014 03:24 PM   Modules accepted: Orders

## 2014-11-05 MED ORDER — OXYCODONE-ACETAMINOPHEN 5-325 MG PO TABS: 1.0000 | ORAL_TABLET | Freq: Three times a day (TID) | ORAL | Status: DC | PRN

## 2014-11-05 NOTE — Addendum Note (Signed)
Addended by: Frederica KusterMILLER, STEPHEN M on: 11/05/2014 05:40 PM   Modules accepted: Orders

## 2014-11-10 ENCOUNTER — Other Ambulatory Visit: Payer: Self-pay | Admitting: Nurse Practitioner

## 2014-11-10 NOTE — Telephone Encounter (Signed)
The adderral is working but she is taking 30mg  in the am and 1 in the pm. Can we refill please

## 2014-11-10 NOTE — Telephone Encounter (Signed)
You were given 60 pills should last you until 27th of this month

## 2014-11-12 ENCOUNTER — Other Ambulatory Visit: Payer: Self-pay | Admitting: Family Medicine

## 2014-11-12 MED ORDER — GABAPENTIN 100 MG PO CAPS: 300.0000 mg | ORAL_CAPSULE | Freq: Two times a day (BID) | ORAL | Status: DC

## 2014-11-12 MED ORDER — OXYCODONE-ACETAMINOPHEN 5-325 MG PO TABS: 1.0000 | ORAL_TABLET | Freq: Three times a day (TID) | ORAL | Status: DC | PRN

## 2014-11-12 NOTE — Addendum Note (Signed)
Addended by: Frederica KusterMILLER, STEPHEN M on: 11/12/2014 04:40 PM   Modules accepted: Orders

## 2014-11-12 NOTE — Telephone Encounter (Signed)
Pt aware.

## 2014-11-17 ENCOUNTER — Other Ambulatory Visit: Payer: Self-pay | Admitting: *Deleted

## 2014-11-20 ENCOUNTER — Other Ambulatory Visit: Payer: Self-pay | Admitting: Nurse Practitioner

## 2014-11-20 MED ORDER — AMPHETAMINE-DEXTROAMPHETAMINE 20 MG PO TABS: 20.0000 mg | ORAL_TABLET | Freq: Two times a day (BID) | ORAL | Status: DC

## 2014-11-20 NOTE — Telephone Encounter (Signed)
Patient notified that rx up front ready for pick up 

## 2014-11-20 NOTE — Telephone Encounter (Signed)
.  adderall rx ready for pick up - DO NOT TAKE MORE THEN 1 2x a day

## 2014-11-20 NOTE — Telephone Encounter (Signed)
Patient is requesting refill on adderall. She is due on Saturday.

## 2014-11-25 ENCOUNTER — Other Ambulatory Visit: Payer: Self-pay | Admitting: *Deleted

## 2014-11-25 MED ORDER — GABAPENTIN 600 MG PO TABS: 600.0000 mg | ORAL_TABLET | Freq: Two times a day (BID) | ORAL | Status: DC

## 2014-11-25 NOTE — Telephone Encounter (Signed)
Patient taking Neurontin 600mg  BID and it is working well for trigeminal neuralgia.   Can you change prescription to this?

## 2014-12-10 ENCOUNTER — Other Ambulatory Visit: Payer: Self-pay

## 2014-12-10 MED ORDER — GABAPENTIN 600 MG PO TABS: 600.0000 mg | ORAL_TABLET | Freq: Three times a day (TID) | ORAL | Status: DC

## 2014-12-16 ENCOUNTER — Ambulatory Visit (INDEPENDENT_AMBULATORY_CARE_PROVIDER_SITE_OTHER): Payer: Self-pay | Admitting: Nurse Practitioner

## 2014-12-16 ENCOUNTER — Encounter: Payer: Self-pay | Admitting: Nurse Practitioner

## 2014-12-16 VITALS — BP 124/69 | HR 88 | Temp 97.8°F | Ht 67.0 in | Wt 137.4 lb

## 2014-12-16 DIAGNOSIS — F909 Attention-deficit hyperactivity disorder, unspecified type: Secondary | ICD-10-CM | POA: Insufficient documentation

## 2014-12-16 DIAGNOSIS — F900 Attention-deficit hyperactivity disorder, predominantly inattentive type: Secondary | ICD-10-CM

## 2014-12-16 MED ORDER — AMPHETAMINE-DEXTROAMPHETAMINE 20 MG PO TABS: 20.0000 mg | ORAL_TABLET | Freq: Three times a day (TID) | ORAL | Status: DC

## 2014-12-16 NOTE — Patient Instructions (Signed)

## 2014-12-16 NOTE — Addendum Note (Signed)
Addended by: Bennie Pierini on: 12/16/2014 12:35 PM   Modules accepted: Orders

## 2014-12-16 NOTE — Progress Notes (Signed)
   Subjective:    Patient ID: Valerie Ayala, female    DOB: Aug 01, 1985, 29 y.o.   MRN: [id number]  HPI Patient brought in today by self for follow up of ADHD. Currently taking adderall 20 mg- needs 1 tid on some days. Behavior- much calmer Grades- good Medication side effects- none Weight loss- none Sleeping habits- good Any concerns- none     Review of Systems     Objective:   Physical Exam  Constitutional: She is oriented to person, place, and time. She appears well-developed and well-nourished.  Cardiovascular: Normal rate, regular rhythm and normal heart sounds.   Pulmonary/Chest: Effort normal and breath sounds normal.  Neurological: She is alert and oriented to person, place, and time.  Skin: Skin is warm and dry.  Psychiatric: She has a normal mood and affect. Her behavior is normal. Judgment and thought content normal.    BP 124/69 mmHg  Pulse 88  Temp(Src) 97.8 F (36.6 C) (Oral)  Ht 5\' 7"  (1.702 m)  Wt 137 lb 6.4 oz (62.324 kg)  BMI 21.51 kg/m2       Assessment & Plan:  1. Adult ADHD Meds ordered this encounter  Medications  . amphetamine-dextroamphetamine (ADDERALL) 20 MG tablet    Sig: Take 1 tablet (20 mg total) by mouth 3 (three) times daily.    Dispense:  90 tablet    Refill:  0    Order Specific Question:  Supervising Provider    Answer:  Ernestina Penna [[id number]]  . amphetamine-dextroamphetamine (ADDERALL) 20 MG tablet    Sig: Take 1 tablet (20 mg total) by mouth 3 (three) times daily.    Dispense:  90 tablet    Refill:  0    Do not fill till 01/14/15    Order Specific Question:  Supervising Provider    Answer:  Ernestina Penna [[id number]]   Stress management RTO prn  [name], FNP

## 2015-01-27 ENCOUNTER — Encounter: Payer: Self-pay | Admitting: Nurse Practitioner

## 2015-01-27 ENCOUNTER — Ambulatory Visit (INDEPENDENT_AMBULATORY_CARE_PROVIDER_SITE_OTHER): Payer: Self-pay | Admitting: Nurse Practitioner

## 2015-01-27 ENCOUNTER — Ambulatory Visit (INDEPENDENT_AMBULATORY_CARE_PROVIDER_SITE_OTHER): Payer: Self-pay

## 2015-01-27 VITALS — BP 124/74 | HR 71 | Temp 99.1°F | Ht 67.0 in | Wt 143.0 lb

## 2015-01-27 DIAGNOSIS — R059 Cough, unspecified: Secondary | ICD-10-CM

## 2015-01-27 DIAGNOSIS — R05 Cough: Secondary | ICD-10-CM

## 2015-01-27 DIAGNOSIS — J189 Pneumonia, unspecified organism: Secondary | ICD-10-CM

## 2015-01-27 DIAGNOSIS — J069 Acute upper respiratory infection, unspecified: Secondary | ICD-10-CM

## 2015-01-27 MED ORDER — OXYCODONE-ACETAMINOPHEN 5-325 MG PO TABS: 1.0000 | ORAL_TABLET | Freq: Three times a day (TID) | ORAL | Status: DC | PRN

## 2015-01-27 MED ORDER — AMOXICILLIN 875 MG PO TABS: 875.0000 mg | ORAL_TABLET | Freq: Two times a day (BID) | ORAL | Status: DC

## 2015-01-27 NOTE — Patient Instructions (Signed)

## 2015-01-27 NOTE — Progress Notes (Signed)
Subjective:    Patient ID: Valerie Ayala, female    DOB: 1986/06/11, 29 y.o.   MRN: [id number]  HPI Patient in today c/o cough and congestion- and chest tightness. Low garde fever last night  Slight sore throat. Deep wet productive cough.    Review of Systems  Constitutional: Positive for fever, chills, appetite change and fatigue.  HENT: Positive for congestion, ear pain, postnasal drip, sinus pressure and sore throat.   Respiratory: Positive for cough.   Cardiovascular: Negative.   Gastrointestinal: Negative.   Neurological: Negative.   Psychiatric/Behavioral: Negative.   All other systems reviewed and are negative.      Objective:   Physical Exam  Constitutional: She is oriented to person, place, and time. She appears well-developed and well-nourished.  HENT:  Right Ear: Hearing, tympanic membrane, external ear and ear canal normal.  Left Ear: Hearing, tympanic membrane, external ear and ear canal normal.  Nose: Rhinorrhea present. Right sinus exhibits no maxillary sinus tenderness and no frontal sinus tenderness. Left sinus exhibits no maxillary sinus tenderness and no frontal sinus tenderness.  Mouth/Throat: Uvula is midline. Posterior oropharyngeal erythema present.  Eyes: Pupils are equal, round, and reactive to light.  Neck: Normal range of motion. Neck supple.  Cardiovascular: Normal rate, regular rhythm and normal heart sounds.   Pulmonary/Chest: Effort normal. She has wheezes.  Deep wet cough-   Abdominal: Soft. Bowel sounds are normal.  Musculoskeletal: Normal range of motion.  Neurological: She is alert and oriented to person, place, and time.  Skin: Skin is warm.  Psychiatric: She has a normal mood and affect. Her behavior is normal. Judgment and thought content normal.   BP 124/74 mmHg  Pulse 71  Temp(Src) 99.1 F (37.3 C) (Oral)  Ht  (1.702 m)  Wt 143 lb (64.864 kg)  BMI 22.39 kg/m2  SpO2 98%   Chest xray- possible lower lobe  infiltrates-Preliminary reading by Paulene Floor, FNP  [facility]      Assessment & Plan:   1. Cough   2. Pneumonia, organism unspecified   3. Acute upper respiratory infection    Meds ordered this encounter  Medications  . amoxicillin (AMOXIL) 875 MG tablet    Sig: Take 1 tablet (875 mg total) by mouth 2 (two) times daily. 1 po BID    Dispense:  20 tablet    Refill:  0    Order Specific Question:  Supervising Provider    Answer:  Ernestina Penna [[id number]]  . oxyCODONE-acetaminophen (ROXICET) 5-325 MG per tablet    Sig: Take 1 tablet by mouth every 8 (eight) hours as needed for severe pain.    Dispense:  12 tablet    Refill:  0    Order Specific Question:  Supervising Provider    Answer:  Ernestina Penna [[id number]]   1. Take meds as prescribed 2. Use a cool mist humidifier especially during the winter months and when heat has been humid. 3. Use saline nose sprays frequently 4. Saline irrigations of the nose can be very helpful if done frequently.  * 4X daily for 1 week*  * Use of a nettie pot can be helpful with this. Follow directions with this* 5. Drink plenty of fluids 6. Keep thermostat turn down low 7.For any cough or congestion  Use plain Mucinex- regular strength or max strength is fine   * Children- consult with Pharmacist for dosing 8. For fever or aces or pains- take tylenol or ibuprofen appropriate for age and  weight.  * for fevers greater than 101 orally you may alternate ibuprofen and tylenol every  3 hours.   Mary-Margaret Hassell Done, FNP

## 2015-02-03 ENCOUNTER — Other Ambulatory Visit: Payer: Self-pay | Admitting: Family Medicine

## 2015-02-04 ENCOUNTER — Other Ambulatory Visit: Payer: Self-pay | Admitting: Nurse Practitioner

## 2015-02-13 ENCOUNTER — Telehealth: Payer: Self-pay | Admitting: *Deleted

## 2015-02-13 MED ORDER — AMPHETAMINE-DEXTROAMPHETAMINE 20 MG PO TABS: 20.0000 mg | ORAL_TABLET | Freq: Three times a day (TID) | ORAL | Status: DC

## 2015-02-13 NOTE — Telephone Encounter (Signed)
Requesting refill on Adderall.  Last written 12/16/14.  Will switch to Vyvanse once insurance kicks in.

## 2015-02-20 ENCOUNTER — Other Ambulatory Visit (INDEPENDENT_AMBULATORY_CARE_PROVIDER_SITE_OTHER): Payer: Self-pay

## 2015-02-20 ENCOUNTER — Other Ambulatory Visit: Payer: Self-pay | Admitting: *Deleted

## 2015-02-20 DIAGNOSIS — E039 Hypothyroidism, unspecified: Secondary | ICD-10-CM

## 2015-02-20 NOTE — Progress Notes (Signed)
Lab only 

## 2015-02-21 LAB — CMP14+EGFR
ALT: 13 IU/L (ref 0–32)
AST: 17 IU/L (ref 0–40)
Albumin/Globulin Ratio: 1.9 (ref 1.1–2.5)
Albumin: 4.8 g/dL (ref 3.5–5.5)
Alkaline Phosphatase: 49 IU/L (ref 39–117)
BUN/Creatinine Ratio: 9 (ref 8–20)
BUN: 10 mg/dL (ref 6–20)
Bilirubin Total: 0.8 mg/dL (ref 0.0–1.2)
CALCIUM: 9.2 mg/dL (ref 8.7–10.2)
CO2: 26 mmol/L (ref 18–29)
CREATININE: 1.14 mg/dL — AB (ref 0.57–1.00)
Chloride: 99 mmol/L (ref 97–108)
GFR calc Af Amer: 75 mL/min/{1.73_m2} (ref >59)
GFR, EST NON AFRICAN AMERICAN: 65 mL/min/{1.73_m2} (ref >59)
GLOBULIN, TOTAL: 2.5 g/dL (ref 1.5–4.5)
GLUCOSE: 87 mg/dL (ref 65–99)
Potassium: 4.9 mmol/L (ref 3.5–5.2)
SODIUM: 144 mmol/L (ref 134–144)
Total Protein: 7.3 g/dL (ref 6.0–8.5)

## 2015-02-21 LAB — THYROID PANEL WITH TSH
FREE THYROXINE INDEX: 2.5 (ref 1.2–4.9)
T3 UPTAKE RATIO: 25 % (ref 24–39)
T4 TOTAL: 9.8 ug/dL (ref 4.5–12.0)
TSH: 1.68 u[IU]/mL (ref 0.450–4.500)

## 2015-02-23 ENCOUNTER — Other Ambulatory Visit: Payer: Self-pay

## 2015-02-23 NOTE — Telephone Encounter (Signed)
Patient is needing a refill on her gabapentin . It is working well now but she had to increase the dose to 2 in the morning, 1 midday, and 2 in the afternoon. She has ran out early because of this.  She states that if she misses a dose her facial pain comes back. Will you authorize a new prescription with these different directions? Prescription setup with new directions but if you don't want to fill it that way they can be changed. Route to Pool A after review and response.

## 2015-02-24 ENCOUNTER — Other Ambulatory Visit: Payer: Self-pay | Admitting: *Deleted

## 2015-02-24 MED ORDER — GABAPENTIN 600 MG PO TABS: ORAL_TABLET | ORAL | Status: DC

## 2015-02-24 MED ORDER — CITALOPRAM HYDROBROMIDE 20 MG PO TABS: 20.0000 mg | ORAL_TABLET | Freq: Every day | ORAL | Status: DC

## 2015-02-24 NOTE — Telephone Encounter (Signed)
Pt aware refill sent to pharmacy 

## 2015-03-16 ENCOUNTER — Ambulatory Visit (INDEPENDENT_AMBULATORY_CARE_PROVIDER_SITE_OTHER): Payer: Self-pay | Admitting: *Deleted

## 2015-03-16 DIAGNOSIS — Z111 Encounter for screening for respiratory tuberculosis: Secondary | ICD-10-CM

## 2015-03-16 NOTE — Progress Notes (Signed)
PPD skin test placed on left forearm and patient tolerated well.

## 2015-03-17 ENCOUNTER — Encounter: Payer: Self-pay | Admitting: Nurse Practitioner

## 2015-03-18 ENCOUNTER — Encounter: Payer: Self-pay | Admitting: Family Medicine

## 2015-03-18 ENCOUNTER — Encounter: Payer: Self-pay | Admitting: *Deleted

## 2015-03-18 ENCOUNTER — Ambulatory Visit (INDEPENDENT_AMBULATORY_CARE_PROVIDER_SITE_OTHER): Payer: Self-pay | Admitting: Family Medicine

## 2015-03-18 VITALS — BP 111/61 | HR 67 | Temp 97.7°F | Ht 67.0 in | Wt 141.0 lb

## 2015-03-18 DIAGNOSIS — Z048 Encounter for examination and observation for other specified reasons: Secondary | ICD-10-CM

## 2015-03-18 DIAGNOSIS — Z020 Encounter for examination for admission to educational institution: Secondary | ICD-10-CM

## 2015-03-18 DIAGNOSIS — Z0184 Encounter for antibody response examination: Secondary | ICD-10-CM

## 2015-03-18 DIAGNOSIS — Z0000 Encounter for general adult medical examination without abnormal findings: Secondary | ICD-10-CM

## 2015-03-18 LAB — TB SKIN TEST
Induration: 0 mm
TB SKIN TEST: NEGATIVE

## 2015-03-18 NOTE — Progress Notes (Signed)
   Subjective:    Patient ID: Valerie Ayala, female    DOB: [birth year], 29 y.o.   MRN: [id number]  HPI 29 year old female who is here for a school exam. 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 She has a history of trigeminal neuralgia symptoms are well controlled with gabapentin. No other specific concerns or needs today except for the exam   Review of Systems  Constitutional: Negative.   Cardiovascular: Negative.   Gastrointestinal: Negative.   Musculoskeletal: Negative.   Neurological: Negative.   Psychiatric/Behavioral: Negative.        Objective:   Physical Exam  Constitutional: She is oriented to person, place, and time. She appears well-developed and well-nourished.  Eyes: Pupils are equal, round, and reactive to light.  Neck: Normal range of motion.  Cardiovascular: Normal rate, regular rhythm and normal heart sounds.   Pulmonary/Chest: Effort normal and breath sounds normal.  Abdominal: Soft.  Musculoskeletal: Normal range of motion.  Neurological: She is alert and oriented to person, place, and time.  Psychiatric: She has a normal mood and affect. Her behavior is normal.     BP 111/61 mmHg  Pulse 67  Temp(Src) 97.7 F (36.5 C) (Oral)  Ht  (1.702 m)  Wt 141 lb (63.957 kg)  BMI 22.08 kg/m2     Assessment & Plan:

## 2015-03-19 LAB — MEASLES/MUMPS/RUBELLA IMMUNITY
MUMPS ABS, IGG: 18.4 [AU]/ml (ref >10.9)
RUBELLA: 2.47 {index} (ref >0.99)

## 2015-03-23 ENCOUNTER — Ambulatory Visit (INDEPENDENT_AMBULATORY_CARE_PROVIDER_SITE_OTHER): Payer: Self-pay

## 2015-03-23 DIAGNOSIS — Z111 Encounter for screening for respiratory tuberculosis: Secondary | ICD-10-CM

## 2015-03-25 ENCOUNTER — Other Ambulatory Visit: Payer: Self-pay | Admitting: *Deleted

## 2015-03-25 LAB — TB SKIN TEST: TB SKIN TEST: NEGATIVE

## 2015-03-25 MED ORDER — GABAPENTIN 600 MG PO TABS: ORAL_TABLET | ORAL | Status: DC

## 2015-04-22 NOTE — Progress Notes (Addendum)
   Subjective:    Patient ID: Valerie Ayala, female    DOB: [birth year], 29 y.o.   MRN: [id number]  HPI    Review of Systems     Objective:   Physical Exam        Assessment & Plan:  1. Immunity status testing  - Measles/Mumps/Rubella Immunity  2. PE (physical exam), annual Exam is normal. Continue adjusting gabapentin to treat her trigeminal neuralgia. Will do testing for immunity as required for her physical exam.  Frederica KusterStephen M Merlene Dante MD

## 2015-04-23 ENCOUNTER — Other Ambulatory Visit: Payer: Self-pay | Admitting: Nurse Practitioner

## 2015-04-30 ENCOUNTER — Telehealth: Payer: Self-pay | Admitting: *Deleted

## 2015-05-01 MED ORDER — AMOXICILLIN 875 MG PO TABS: 875.0000 mg | ORAL_TABLET | Freq: Two times a day (BID) | ORAL | Status: DC

## 2015-05-01 NOTE — Telephone Encounter (Signed)
Pt aware.

## 2015-05-01 NOTE — Telephone Encounter (Signed)
Amoxicillin rx sent to pharmacy 

## 2015-05-12 ENCOUNTER — Telehealth: Payer: Self-pay | Admitting: Nurse Practitioner

## 2015-05-12 NOTE — Telephone Encounter (Signed)
Has not had xanax or klonopin in a  Long time- will need to be seen to get.

## 2015-05-15 ENCOUNTER — Ambulatory Visit (INDEPENDENT_AMBULATORY_CARE_PROVIDER_SITE_OTHER): Payer: Self-pay | Admitting: Pediatrics

## 2015-05-15 ENCOUNTER — Telehealth: Payer: Self-pay | Admitting: *Deleted

## 2015-05-15 ENCOUNTER — Other Ambulatory Visit: Payer: Self-pay | Admitting: *Deleted

## 2015-05-15 DIAGNOSIS — S0431XD Injury of trigeminal nerve, right side, subsequent encounter: Secondary | ICD-10-CM

## 2015-05-15 MED ORDER — TOPIRAMATE 25 MG PO TABS: 25.0000 mg | ORAL_TABLET | Freq: Two times a day (BID) | ORAL | Status: DC

## 2015-05-15 MED ORDER — OXYCODONE-ACETAMINOPHEN 5-325 MG PO TABS: 1.0000 | ORAL_TABLET | Freq: Three times a day (TID) | ORAL | Status: DC | PRN

## 2015-05-15 NOTE — Telephone Encounter (Signed)
Tried to return call, no answer, left a message. Will try again.

## 2015-05-15 NOTE — Progress Notes (Signed)
Subjective:    Patient ID: Valerie Ayala, female    DOB: [birth year], 29 y.o.   MRN: [id number]  CC: f/u trigeminal neuralgia  HPI: Valerie Ayala is a 29 y.o. female presenting on 05/15/2015 for Facial Swelling  Ongoing for past 6 months. Has never entirely gone away, pain does come and go in intensity. Eye sometimes sensitive to light when pain is at its worst, feels "pressure behind her eye ball". No rash on face, no facial droop.   Relevant past medical, surgical, family and social history reviewed and updated as indicated. Interim medical history since our last visit reviewed. Allergies and medications reviewed and updated.   ROS: Per HPI unless specifically indicated above  Past Medical History Patient Active Problem List   Diagnosis Date Noted  . Adult ADHD 12/16/2014  . Asthma 05/07/2013  . Hypothyroidism 11/26/2012  . GAD (generalized anxiety disorder) 11/26/2012    Current Outpatient Prescriptions  Medication Sig Dispense Refill  . albuterol (PROVENTIL HFA;VENTOLIN HFA) 108 (90 BASE) MCG/ACT inhaler Inhale 2 puffs into the lungs every 6 (six) hours as needed. For shortness of breath    . amoxicillin (AMOXIL) 875 MG tablet Take 1 tablet (875 mg total) by mouth 2 (two) times daily. 1 po BID 20 tablet 0  . buPROPion (WELLBUTRIN XL) 150 MG 24 hr tablet TAKE ONE TABLET BY MOUTH ONCE DAILY 30 tablet 1  . calcitRIOL (ROCALTROL) 0.5 MCG capsule Take 1 capsule (0.5 mcg total) by mouth 2 (two) times daily. 60 capsule 5  . cholecalciferol (VITAMIN D) 1000 UNITS tablet Take 1,000 Units by mouth daily.    . citalopram (CELEXA) 20 MG tablet Take 1 tablet (20 mg total) by mouth daily. 30 tablet 5  . gabapentin (NEURONTIN) 600 MG tablet Take 2 tablets in the morning, one midday, and 2 at bedtime 150 tablet 1  . ibuprofen (ADVIL,MOTRIN) 200 MG tablet Take 40 mg by mouth every 6 (six) hours as needed for moderate pain.    Marland Kitchen. levothyroxine (SYNTHROID, LEVOTHROID) 150 MCG tablet TAKE ONE  TABLET BY MOUTH ONCE DAILY 30 tablet 4  . oxyCODONE-acetaminophen (ROXICET) 5-325 MG tablet Take 1 tablet by mouth every 8 (eight) hours as needed for severe pain. 15 tablet 0  . topiramate (TOPAMAX) 25 MG tablet Take 1 tablet (25 mg total) by mouth 2 (two) times daily. 60 tablet 2   No current facility-administered medications for this visit.       Objective:    There were no vitals taken for this visit.  Wt Readings from Last 3 Encounters:  03/18/15 141 lb (63.957 kg)  01/27/15 143 lb (64.864 kg)  12/16/14 137 lb 6.4 oz (62.324 kg)    Gen: NAD EYES: EOMI, no scleral injection or icterus. R eye lid slightly lower and puffy compared with L Neuro: forehead creases symmetric, CN VII intact Skin: no lesions on face or ear      Assessment & Plan:   Valerie Ayala was seen today for trigeminal neuralgia. Has been tried on carbamazepine in the past, no response at lower doses and had side effects that prevented increase in medicine. Taking 1200mg  gabapentin BID with 600mg  in the middle of day. Increase to 1200mg  TID, add topiramate. Ideally would get MRI for further imaging as has been going on for 6 months and refer to neurology but pt declined due to no insurance. She did have a negative CT scan 6 months ago.  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 -     oxyCODONE-acetaminophen (ROXICET) 5-325 MG tablet; Take 1 tablet by mouth every 8 (eight) hours as needed for severe pain, gave #15 tabs, do not refill unless seen and appropriate for refill.    Follow up plan: prn  Rex Kras, MD [facility] 05/15/2015, 9:21 PM

## 2015-05-15 NOTE — Patient Instructions (Signed)
Anacreme lidocaine 4%  oxycodone

## 2015-05-15 NOTE — Telephone Encounter (Signed)
Patient with history of trigeminal neuralgia that is normally controlled with gabapentin. Complains of acute onset of severe facial pain and swelling on right side accompanied by eyelid swelling and drooping and gum pain. Symptoms began last night. Skin is tender to the touch all the way around to her cervical spine. No visible rashes.  She is concerned that there may be an active herpes infection causing this flare. This has happened in the past and valacyclovir was very helpful. She is self pay and isn't able to pay for a visit today because she is currently unemployed while going to school.  Would [facility]? Acyclovir 200mg  is on their $4 list. Would this be appropriate? I realize she may need to take many more pills than would be necessary with valacyclovir.

## 2015-05-18 ENCOUNTER — Ambulatory Visit: Payer: Self-pay | Admitting: Nurse Practitioner

## 2015-05-25 ENCOUNTER — Telehealth: Payer: Self-pay | Admitting: Nurse Practitioner

## 2015-05-25 DIAGNOSIS — S0431XD Injury of trigeminal nerve, right side, subsequent encounter: Secondary | ICD-10-CM

## 2015-05-25 MED ORDER — GABAPENTIN 600 MG PO TABS: ORAL_TABLET | ORAL | Status: DC

## 2015-05-25 MED ORDER — TOPIRAMATE 100 MG PO TABS: 100.0000 mg | ORAL_TABLET | Freq: Two times a day (BID) | ORAL | Status: DC

## 2015-05-25 NOTE — Telephone Encounter (Signed)
Spoke with pt by phone, continue topamax 100mg  BID and gabapentin 600mg  TID for trigeminal neuralgia. No sleepiness, no dizziness. Topamax has helped improve headache as well. Discussed wanting to avoid further oxycodone at this point. Pt in agreement.

## 2015-05-25 NOTE — Telephone Encounter (Signed)
If possible would like for you to call her so she can explain it directly to you. The Topamax did help some but in a larger dose that prescribed. 4 pills eased the pain. She complains that she can't sleep and that her face hurts all the time. It is affecting her schoolwork and home life. Pain med is good for break through but she doesn't want to have to take it so frequently. Her husband was prescribed Valium for nerve pain (?) and she tried that and said that it actually worked very well. She took 20mg . She felt that you listened to her at her last visit and would like to talk with you about this.

## 2015-06-01 ENCOUNTER — Telehealth: Payer: Self-pay | Admitting: Nurse Practitioner

## 2015-06-01 NOTE — Telephone Encounter (Signed)
Pt aware of md feedback. 

## 2015-06-01 NOTE — Telephone Encounter (Signed)
She can try NSAIDs, ice packs. I can't prescribe oxycodone over the phone, would have to have a visit and even then trigeminal neuralgia we usually dont treat with narcotics like that.

## 2015-06-01 NOTE — Telephone Encounter (Signed)
Stp and she states that the topamax works sometimes and then not at others. Pt wants to know if she can get an rx for the oxycodone?

## 2015-06-24 ENCOUNTER — Other Ambulatory Visit: Payer: Self-pay | Admitting: Family Medicine

## 2015-07-30 ENCOUNTER — Other Ambulatory Visit: Payer: Self-pay | Admitting: Nurse Practitioner

## 2015-07-31 ENCOUNTER — Telehealth: Payer: Self-pay | Admitting: Nurse Practitioner

## 2015-07-31 MED ORDER — BUPROPION HCL ER (XL) 300 MG PO TB24: 300.0000 mg | ORAL_TABLET | Freq: Every day | ORAL | Status: DC

## 2015-07-31 NOTE — Telephone Encounter (Signed)
Pt aware.

## 2015-07-31 NOTE — Telephone Encounter (Signed)
Yes, wellbutrin increase to  sent in.

## 2015-12-01 ENCOUNTER — Telehealth: Payer: Self-pay | Admitting: Family Medicine

## 2015-12-24 ENCOUNTER — Ambulatory Visit (INDEPENDENT_AMBULATORY_CARE_PROVIDER_SITE_OTHER): Payer: Self-pay | Admitting: Pediatrics

## 2015-12-24 ENCOUNTER — Encounter: Payer: Self-pay | Admitting: Pediatrics

## 2015-12-24 VITALS — BP 103/67 | HR 97 | Temp 98.7°F | Ht 67.0 in | Wt 143.6 lb

## 2015-12-24 DIAGNOSIS — R87612 Low grade squamous intraepithelial lesion on cytologic smear of cervix (LGSIL): Secondary | ICD-10-CM

## 2015-12-24 DIAGNOSIS — Z30432 Encounter for removal of intrauterine contraceptive device: Secondary | ICD-10-CM

## 2015-12-24 DIAGNOSIS — Z0000 Encounter for general adult medical examination without abnormal findings: Secondary | ICD-10-CM

## 2015-12-24 DIAGNOSIS — Z124 Encounter for screening for malignant neoplasm of cervix: Secondary | ICD-10-CM

## 2015-12-24 DIAGNOSIS — Z30011 Encounter for initial prescription of contraceptive pills: Secondary | ICD-10-CM

## 2015-12-24 MED ORDER — NORGESTIMATE-ETH ESTRADIOL 0.25-35 MG-MCG PO TABS: 1.0000 | ORAL_TABLET | Freq: Every day | ORAL | Status: DC

## 2015-12-24 NOTE — Progress Notes (Signed)
    Subjective:    Patient ID: Valerie Ayala, female    DOB: Jun 06, 1986, 30 y.o.   MRN: [id number]  CC: Gynecologic Exam and IUD removal   HPI: Valerie Ayala is a 30 y.o. female presenting for Gynecologic Exam and IUD removal  Pap smear: due today Birth control: planning IUD, wants this IUD out now and to be on OCP for a while before new IUD. Having continuous spotting Tobacco use: continues to smoke, pre-contemplative. Has quit in the past Has been feeling well   [facility] 12/24/2015  Decreased Interest 0  Down, Depressed, Hopeless 0  PHQ - 2 Score 0     Relevant past medical, surgical, family and social history reviewed and updated as indicated.  Interim medical history since our last visit reviewed. Allergies and medications reviewed and updated.  ROS: All systems negative other than what is in the HPI     Objective:    BP 103/67 mmHg  Pulse 97  Temp(Src) 98.7 F (37.1 C) (Oral)  Ht 5\' 7"  (1.702 m)  Wt 143 lb 9.6 oz (65.137 kg)  BMI 22.49 kg/m2  Wt Readings from Last 3 Encounters:  12/24/15 143 lb 9.6 oz (65.137 kg)  03/18/15 141 lb (63.957 kg)  01/27/15 143 lb (64.864 kg)     Gen: NAD, alert, cooperative with exam, NCAT EYES: EOMI, no scleral injection or icterus ENT:  OP without erythema LYMPH: no cervical LAD CV: NRRR, normal S1/S2, no murmur, distal pulses 2+ b/l Resp: CTABL, no wheezes, normal WOB Abd: +BS, soft, NTND.  Ext: No edema, warm Neuro: Alert and oriented, strength equal b/l UE and LE, coordination grossly normal GU: normal external genitalia, normal vaginal rugae, normal appearing cervix. Minimal white discharge present in the vaginal vault     Assessment & Plan:    Valerie Ayala was seen today for CPE and iud removal.  Diagnoses and all orders for this visit:  Encounter for preventive health examination -     Pap IG and HPV (high risk) DNA detection  Encounter for initial prescription of contraceptive pills -      norgestimate-ethinyl estradiol (ORTHO-CYCLEN,SPRINTEC,PREVIFEM) 0.25-35 MG-MCG tablet; Take 1 tablet by mouth daily.  Encounter for IUD removal See separate procedure note  Cervical cancer screening -     Pap IG and HPV (high risk) DNA detection    PROCEDURE NOTE A speculum was placed. The Mirena was removed in entirety using ring forceps. The intact device was shown to the patient and then appropriately disposed of.   Follow up plan: Return if symptoms worsen or fail to improve.  Rex Krasarol Mahonri Seiden, MD [facility] 12/24/2015, 4:08 PM

## 2015-12-30 LAB — PAP IG AND HPV HIGH-RISK: PAP SMEAR COMMENT: 0

## 2015-12-30 LAB — HPV, LOW VOLUME (REFLEX): HPV, LOW VOL REFLEX: POSITIVE — AB

## 2015-12-31 NOTE — Addendum Note (Signed)
Addended by: Johna SheriffVINCENT, Emersyn Kotarski L on: 12/31/2015 07:55 AM   Modules accepted: Orders, SmartSet

## 2016-02-08 ENCOUNTER — Encounter: Payer: Self-pay | Admitting: Obstetrics and Gynecology

## 2016-08-02 ENCOUNTER — Other Ambulatory Visit: Payer: Self-pay | Admitting: *Deleted

## 2016-08-02 MED ORDER — LEVOTHYROXINE SODIUM 150 MCG PO TABS: 150.0000 ug | ORAL_TABLET | Freq: Every day | ORAL | 0 refills | Status: DC

## 2016-10-26 ENCOUNTER — Other Ambulatory Visit: Payer: Self-pay | Admitting: Pediatrics

## 2016-10-26 DIAGNOSIS — Z30011 Encounter for initial prescription of contraceptive pills: Secondary | ICD-10-CM

## 2016-12-30 ENCOUNTER — Other Ambulatory Visit: Payer: Self-pay | Admitting: Pediatrics

## 2016-12-30 ENCOUNTER — Other Ambulatory Visit: Payer: Self-pay | Admitting: Nurse Practitioner

## 2016-12-30 DIAGNOSIS — Z30011 Encounter for initial prescription of contraceptive pills: Secondary | ICD-10-CM

## 2017-01-02 NOTE — Telephone Encounter (Signed)
Last seen 12/23/16  Dr Oswaldo DoneVincent  MMM PCP   Last thyroid level 02/20/15

## 2017-05-05 ENCOUNTER — Encounter: Payer: Self-pay | Admitting: Family Medicine

## 2017-05-05 ENCOUNTER — Ambulatory Visit (INDEPENDENT_AMBULATORY_CARE_PROVIDER_SITE_OTHER): Payer: Self-pay | Admitting: Family Medicine

## 2017-05-05 VITALS — BP 119/79 | HR 73 | Temp 98.0°F | Ht 67.0 in | Wt 151.0 lb

## 2017-05-05 DIAGNOSIS — E039 Hypothyroidism, unspecified: Secondary | ICD-10-CM

## 2017-05-05 DIAGNOSIS — J189 Pneumonia, unspecified organism: Secondary | ICD-10-CM

## 2017-05-05 MED ORDER — PREDNISONE 20 MG PO TABS: ORAL_TABLET | ORAL | 0 refills | Status: DC

## 2017-05-05 MED ORDER — AZITHROMYCIN 250 MG PO TABS: ORAL_TABLET | ORAL | 0 refills | Status: DC

## 2017-05-05 NOTE — Progress Notes (Signed)
BP 119/79   Pulse 73   Temp 98 F (36.7 C) (Oral)   Ht 5' 7"  (1.702 m)   Wt 151 lb (68.5 kg)   BMI 23.65 kg/m    Subjective:    Patient ID: Valerie Ayala, female    DOB: [birth year], 31 y.o.   MRN: [id number]  Valerie: CINCERE Ayala is a 31 y.o. female presenting on 05/05/2017 for Cough, pain in right lung, wheezing at night, fever and Labwork (calcium and thyroid; off of thyroid medication x 6-8 months, calcium over a year)   Valerie Cough and chest congestion and wheezing Patient comes in complaining of cough and chest congestion and wheezing and pain on the right side of her lung especially when coughing a lot.  She says the wheezing has been at night and the cough is been worse at night as well.  She does feel like she is had a fever but she has not taken her temperature.  She does admit that she is a smoker and has still been smoking and knows that this plays a factor.  She says she does have a history of asthma as well.  She does not currently have an inhaler any medication for asthma.  Her cough is been mostly nonproductive at this point.  She has 2 younger children and thinks that she may have caught this from something they brought home from school.  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  She has been off her medications for quite some time because she does not have insurance and could not afford it.  She has been having very significant issues with fatigue and decreased energy and anxiety and thinks it may be correlated to her thyroid.  She has had very low calcium levels in the past and wants to have it rechecked.  She has not had her labs in over a year.  Relevant past medical, surgical, family and social history reviewed and updated as indicated. Interim medical history since our last visit reviewed. Allergies and medications reviewed and updated.  Review of Systems    Constitutional: Positive for fatigue. Negative for chills and fever.  HENT: Positive for congestion, postnasal drip, rhinorrhea, sinus pressure, sneezing and sore throat. Negative for ear discharge and ear pain.   Eyes: Negative for pain, redness and visual disturbance.  Respiratory: Positive for cough and wheezing. Negative for chest tightness and shortness of breath.   Cardiovascular: Negative for chest pain and leg swelling.  Musculoskeletal: Negative for back pain and gait problem.  Skin: Negative for rash.  Neurological: Negative for light-headedness and headaches.  Psychiatric/Behavioral: Negative for agitation and behavioral problems.  All other systems reviewed and are negative.   Per Valerie unless specifically indicated above   Allergies as of 05/05/2017   No Known Allergies     Medication List        Accurate as of 05/05/17 10:23 AM. Always use your most recent med list.          albuterol 108 (90 Base) MCG/ACT inhaler Commonly known as:  PROVENTIL HFA;VENTOLIN HFA Inhale 2 puffs into the lungs every 6 (six) hours as needed. For shortness of breath   azithromycin 250 MG tablet Commonly known as:  ZITHROMAX Take 2 the first day and then one each day after.   calcitRIOL 0.5 MCG capsule Commonly known as:  ROCALTROL TAKE ONE CAPSULE BY MOUTH TWICE DAILY   cholecalciferol 1000 units tablet Commonly  known as:  VITAMIN D Take 1,000 Units by mouth daily.   levothyroxine 150 MCG tablet Commonly known as:  SYNTHROID, LEVOTHROID TAKE ONE TABLET BY MOUTH ONCE DAILY   predniSONE 20 MG tablet Commonly known as:  DELTASONE 2 po at same time daily for 5 days   SPRINTEC 28 0.25-35 MG-MCG tablet Generic drug:  norgestimate-ethinyl estradiol TAKE 1 TABLET BY MOUTH ONCE DAILY (PATIENT  NEEDS  APPOINTMENT  FOR  FURTHER  REFILLS)          Objective:    BP 119/79   Pulse 73   Temp 98 F (36.7 C) (Oral)   Ht 5' 7"  (1.702 m)   Wt 151 lb (68.5 kg)   BMI 23.65 kg/m    Wt Readings from Last 3 Encounters:  05/05/17 151 lb (68.5 kg)  12/24/15 143 lb 9.6 oz (65.1 kg)  03/18/15 141 lb (64 kg)    Physical Exam  Constitutional: She is oriented to person, place, and time. She appears well-developed and well-nourished. No distress.  HENT:  Right Ear: Tympanic membrane, external ear and ear canal normal.  Left Ear: Tympanic membrane, external ear and ear canal normal.  Nose: Mucosal edema and rhinorrhea present. No epistaxis. Right sinus exhibits no maxillary sinus tenderness and no frontal sinus tenderness. Left sinus exhibits no maxillary sinus tenderness and no frontal sinus tenderness.  Mouth/Throat: Uvula is midline and mucous membranes are normal. Posterior oropharyngeal edema and posterior oropharyngeal erythema present. No oropharyngeal exudate or tonsillar abscesses.  Eyes: Conjunctivae and EOM are normal.  Neck: Neck supple. No thyromegaly present.  Cardiovascular: Normal rate, regular rhythm, normal heart sounds and intact distal pulses.  No murmur heard. Pulmonary/Chest: Effort normal and breath sounds normal. No respiratory distress. She has no wheezes. She has no rales.  Musculoskeletal: Normal range of motion.  Lymphadenopathy:    She has no cervical adenopathy.  Neurological: She is alert and oriented to person, place, and time. Coordination normal.  Skin: Skin is warm and dry. No rash noted. She is not diaphoretic.  Psychiatric: She has a normal mood and affect. Her behavior is normal.  Vitals reviewed.     Assessment & Plan:   Problem List Items Addressed This Visit      Endocrine   Hypothyroidism - Primary   Relevant Medications   levothyroxine (SYNTHROID, LEVOTHROID) 150 MCG tablet   Other Relevant Orders   TSH (Completed)    Other Visit Diagnoses    Hypocalcemia       Relevant Orders   CMP14+EGFR (Completed)   Atypical pneumonia       Asthma/COPD exacerbation possibility, will give prednisone with azithromycin   Relevant  Medications   azithromycin (ZITHROMAX) 250 MG tablet   predniSONE (DELTASONE) 20 MG tablet       Follow up plan: Return in about 4 weeks (around 06/02/2017), or if symptoms worsen or fail to improve, for Lab draw for recheck of thyroid and calcium in 4 weeks.  Counseling provided for all of the vaccine components Orders Placed This Encounter  Procedures  . CMP14+EGFR  . TSH    Caryl Pina, MD [facility] 05/05/2017, 10:23 AM

## 2017-05-06 LAB — CMP14+EGFR
ALK PHOS: 36 IU/L — AB (ref 39–117)
ALT: 25 IU/L (ref 0–32)
AST: 24 IU/L (ref 0–40)
Albumin/Globulin Ratio: 2 (ref 1.2–2.2)
Albumin: 4.9 g/dL (ref 3.5–5.5)
BILIRUBIN TOTAL: 1.4 mg/dL — AB (ref 0.0–1.2)
BUN/Creatinine Ratio: 9 (ref 9–23)
BUN: 13 mg/dL (ref 6–20)
CO2: 24 mmol/L (ref 20–29)
CREATININE: 1.38 mg/dL — AB (ref 0.57–1.00)
Calcium: 8.3 mg/dL — ABNORMAL LOW (ref 8.7–10.2)
Chloride: 97 mmol/L (ref 96–106)
GFR calc Af Amer: 58 mL/min/{1.73_m2} — ABNORMAL LOW (ref >59)
GFR calc non Af Amer: 51 mL/min/{1.73_m2} — ABNORMAL LOW (ref >59)
GLOBULIN, TOTAL: 2.4 g/dL (ref 1.5–4.5)
GLUCOSE: 76 mg/dL (ref 65–99)
Potassium: 3.5 mmol/L (ref 3.5–5.2)
SODIUM: 140 mmol/L (ref 134–144)
Total Protein: 7.3 g/dL (ref 6.0–8.5)

## 2017-05-06 LAB — TSH: TSH: 179 u[IU]/mL — AB (ref 0.450–4.500)

## 2017-05-08 MED ORDER — LEVOTHYROXINE SODIUM 150 MCG PO TABS: 150.0000 ug | ORAL_TABLET | Freq: Every day | ORAL | 0 refills | Status: DC

## 2017-08-03 ENCOUNTER — Telehealth: Payer: Self-pay | Admitting: Nurse Practitioner

## 2017-08-04 NOTE — Telephone Encounter (Signed)
Work note up front. Pt aware

## 2017-10-10 ENCOUNTER — Ambulatory Visit (INDEPENDENT_AMBULATORY_CARE_PROVIDER_SITE_OTHER): Payer: Self-pay | Admitting: Family

## 2017-10-10 ENCOUNTER — Encounter: Payer: Self-pay | Admitting: Family

## 2017-10-10 VITALS — BP 125/86 | HR 69 | Temp 98.4°F | Ht 67.0 in | Wt 143.2 lb

## 2017-10-10 DIAGNOSIS — W57XXXA Bitten or stung by nonvenomous insect and other nonvenomous arthropods, initial encounter: Secondary | ICD-10-CM

## 2017-10-10 DIAGNOSIS — A6920 Lyme disease, unspecified: Secondary | ICD-10-CM

## 2017-10-10 MED ORDER — DOXYCYCLINE HYCLATE 100 MG PO TABS: 100.0000 mg | ORAL_TABLET | Freq: Two times a day (BID) | ORAL | 0 refills | Status: DC

## 2017-10-10 NOTE — Patient Instructions (Signed)
Lyme Disease Lyme disease is an infection that affects many parts of the body, including the skin, joints, and nervous system. It is a bacterial infection that starts from the bite of an infected tick. The infection can spread, and some of the symptoms are similar to the flu. If Lyme disease is not treated, it may cause joint pain, swelling, numbness, problems thinking, fatigue, muscle weakness, and other problems. What are the causes? This condition is caused by bacteria called Borrelia burgdorferi. You can get Lyme disease by being bitten by an infected tick. The tick must be attached to your skin to pass along the infection. Deer often carry infected ticks. What increases the risk? The following factors may make you more likely to develop this condition:  Living in or visiting these areas in the [place] ? [place]. ? The [place]. ? The [place].  Spending time in wooded or grassy areas.  Being outdoors with exposed skin.  Camping, gardening, hiking, fishing, or hunting outdoors.  Failing to remove a tick from your skin within 3-4 days.  What are the signs or symptoms? Symptoms of this condition include:  A round, red rash that surrounds the center of the tick bite. This is the first sign of infection. The center of the rash may be blood colored or have tiny blisters.  Fatigue.  Headache.  Chills and fever.  General achiness.  Joint pain, often in the knees.  Muscle pain.  Swollen lymph glands.  Stiff neck.  How is this diagnosed? This condition is diagnosed based on:  Your symptoms and medical history.  A physical exam.  A blood test.  How is this treated? The main treatment for this condition is antibiotic medicine, which is usually taken by mouth (orally). The length of treatment depends on how soon after a tick bite you begin taking the medicine. In some cases, treatment is necessary for several weeks. If the infection is severe, antibiotics  may need to be given through an IV tube that is inserted into one of your veins. Follow these instructions at home:  Take your antibiotic medicine as told by your health care provider. Do not stop taking the antibiotic even if you start to feel better.  Ask your health care provider about takinga probiotic in between doses of your antibiotic to help avoid stomach upset or diarrhea.  Check with your health care provider before supplementing your treatment. Many alternative therapies have not been proven and may be harmful to you.  Keep all follow-up visits as told by your health care provider. This is important. How is this prevented? You can become reinfected if you get another tick bite from an infected tick. Take these steps to help prevent an infection:  Cover your skin with light-colored clothing when you are outdoors in the spring and summer months.  Spray clothing and skin with bug spray. The spray should be 20-30% DEET.  Avoid wooded, grassy, and shaded areas.  Remove yard litter, brush, trash, and plants that attract deer and rodents.  Check yourself for ticks when you come indoors.  Wash clothing worn each day.  Check your pets for ticks before they come inside.  If you find a tick: ? Remove it with tweezers. ? Clean your hands and the bite area with rubbing alcohol or soap and water.  Pregnant women should take special care to avoid tick bites because the infection can be passed along to the fetus. Contact a health care provider if:    You have symptoms after treatment.  You have removed a tick and want to bring it to your health care provider for testing. Get help right away if:  You have an irregular heartbeat.  You have nerve pain.  Your face feels numb. This information is not intended to replace advice given to you by your health care provider. Make sure you discuss any questions you have with your health care provider. Document Released: 09/19/2000 Document  Revised: 02/02/2016 Document Reviewed: 02/02/2016 Elsevier Interactive Patient Education  2018 [employer]vier Inc.  

## 2017-10-10 NOTE — Progress Notes (Signed)
   Subjective:    Patient ID: Valerie Ayala, female    DOB: 08/30/1985, 32 y.o.   MRN: [id number]  HPI PT presents to the office today with headache, fatigue, and body aches. PT reports removing a tick on Sunday on her left lower abdominal area. She reports a rash from where she removed the tick.   Complaining of constant stiffness and achy pain in her hands and neck of 7 out 10.   She is self pay and would like to hold off on lab work if possible today.    Review of Systems  Constitutional: Positive for fatigue and fever.  Musculoskeletal: Positive for arthralgias.  Neurological: Positive for weakness.  All other systems reviewed and are negative.      Objective:   Physical Exam  Constitutional: She is oriented to person, place, and time. She appears well-developed and well-nourished. No distress.  HENT:  Head: Normocephalic and atraumatic.  Left Ear: Tympanic membrane is erythematous (mildly).  Mouth/Throat: Posterior oropharyngeal erythema present.  Eyes: Pupils are equal, round, and reactive to light.  Neck: Normal range of motion. Neck supple. No thyromegaly present.  Cardiovascular: Normal rate, regular rhythm, normal heart sounds and intact distal pulses.  No murmur heard. Pulmonary/Chest: Effort normal and breath sounds normal. No respiratory distress. She has no wheezes.  Abdominal: Soft. Bowel sounds are normal. She exhibits no distension. There is no tenderness.  Musculoskeletal: Normal range of motion. She exhibits no edema or tenderness.  Neurological: She is alert and oriented to person, place, and time. She has normal reflexes. No cranial nerve deficit.  Skin: Skin is warm and dry.  Bullseye rash on left lower abdomen approx 1 cmX 0.9 cm   Psychiatric: She has a normal mood and affect. Her behavior is normal. Judgment and thought content normal.  Vitals reviewed.       BP 125/86   Pulse 69   Temp 98.4 F (36.9 C) (Oral)   Ht 5\' 7"  (1.702 m)   Wt 143 lb  3.2 oz (65 kg)   BMI 22.43 kg/m      Assessment & Plan:  1. Tick bite, initial encounter -Pt to report any new fever, joint pain, or rash -Wear protective clothing while outside- Long sleeves and long pants -Put insect repellent on all exposed skin and along clothing -Take a shower as soon as possible after being outside RTO if symptoms do not improve - doxycycline (VIBRA-TABS) 100 MG tablet; Take 1 tablet (100 mg total) by mouth 2 (two) times daily.  Dispense: 56 tablet; Refill: 0  2. Lyme disease Will treat doxycycline with rash and symptoms  - doxycycline (VIBRA-TABS) 100 MG tablet; Take 1 tablet (100 mg total) by mouth 2 (two) times daily.  Dispense: 56 tablet; Refill: 0   Jannifer Rodneyhristy Dayan Desa, FNP

## 2017-10-18 ENCOUNTER — Other Ambulatory Visit: Payer: Self-pay | Admitting: Family

## 2017-11-30 ENCOUNTER — Telehealth: Payer: Self-pay | Admitting: Nurse Practitioner

## 2017-11-30 NOTE — Telephone Encounter (Signed)
Can not send in antibiotic, but I recommend doing an Evisit.

## 2017-11-30 NOTE — Telephone Encounter (Signed)
What symptoms do you have? UTI  How long have you been sick? Woke up today   Have you been seen for this problem? No she has not, she can barely get out of bed and wants antibiotic sent to pharmacy can not afford to come in sister and dad are at beach does not have money, she is taking cipro 500mg  thinks they may be out of date  If your provider decides to give you a prescription, which [facility]   Patient informed that this information will be sent to the clinical staff for review and that they should receive a follow up call.

## 2017-11-30 NOTE — Telephone Encounter (Signed)
Pt aware.

## 2018-01-03 ENCOUNTER — Encounter: Payer: Self-pay | Admitting: Family Medicine

## 2018-01-03 ENCOUNTER — Ambulatory Visit (INDEPENDENT_AMBULATORY_CARE_PROVIDER_SITE_OTHER): Payer: Self-pay | Admitting: Family Medicine

## 2018-01-03 VITALS — BP 132/81 | HR 82 | Temp 97.4°F | Ht 67.0 in | Wt 139.0 lb

## 2018-01-03 DIAGNOSIS — F411 Generalized anxiety disorder: Secondary | ICD-10-CM

## 2018-01-03 DIAGNOSIS — F909 Attention-deficit hyperactivity disorder, unspecified type: Secondary | ICD-10-CM

## 2018-01-03 DIAGNOSIS — E039 Hypothyroidism, unspecified: Secondary | ICD-10-CM

## 2018-01-03 DIAGNOSIS — Z30011 Encounter for initial prescription of contraceptive pills: Secondary | ICD-10-CM

## 2018-01-03 MED ORDER — AMPHETAMINE-DEXTROAMPHET ER 20 MG PO CP24: 20.0000 mg | ORAL_CAPSULE | Freq: Every day | ORAL | 0 refills | Status: DC

## 2018-01-03 MED ORDER — AMPHETAMINE-DEXTROAMPHET ER 20 MG PO CP24
20.0000 mg | ORAL_CAPSULE | Freq: Every day | ORAL | 0 refills | Status: DC
Start: 2018-01-03 — End: 2018-01-03

## 2018-01-03 MED ORDER — NORGESTIMATE-ETH ESTRADIOL 0.25-35 MG-MCG PO TABS: 1.0000 | ORAL_TABLET | Freq: Every day | ORAL | 3 refills | Status: DC

## 2018-01-03 MED ORDER — LEVOTHYROXINE SODIUM 150 MCG PO TABS: 150.0000 ug | ORAL_TABLET | Freq: Every day | ORAL | 0 refills | Status: DC

## 2018-01-03 NOTE — Progress Notes (Signed)
BP 132/81   Pulse 82   Temp (!) 97.4 F (36.3 C) (Oral)   Ht 5' 7" (1.702 m)   Wt 139 lb (63 kg)   BMI 21.77 kg/m    Subjective:    Patient ID: Valerie Ayala, female    DOB: 1985/09/17, 32 y.o.   MRN: [id number]  HPI: Valerie Ayala is a 32 y.o. female presenting on 01/03/2018 for Discuss ADD (had been on )   HPI Hypothyroidism recheck Patient is coming in for thyroid recheck today as well. They deny any issues with hair changes or heat or cold problems or diarrhea or constipation. They deny any chest pain or palpitations. They are currently on levothyroxine 111mcrograms   Adult ADHD and anxiety Patient is coming in to discuss adult ADHD and anxiety.  She says she has had ADHD most of her life and always stems from having to focus and she recently did lose a job which was not due to this but she has lost multiple jobs due to anxiety and ADHD.  She says her ADHD and focus just make sure all the place and then make sure anxious.  She has 2 children and they are both [place] for [place].  She denies any suicidal ideations or thoughts of hurting herself.  Birth control refill today, wants an IUD but does not have insurance currently  Relevant past medical, surgical, family and social history reviewed and updated as indicated. Interim medical history since our last visit reviewed. Allergies and medications reviewed and updated.  Review of Systems  Constitutional: Negative for chills and fever.  HENT: Negative for congestion, ear discharge and ear pain.   Eyes: Negative for redness and visual disturbance.  Respiratory: Negative for chest tightness and shortness of breath.   Cardiovascular: Negative for chest pain and leg swelling.  Genitourinary: Negative for difficulty urinating and dysuria.  Musculoskeletal: Negative for back pain and gait problem.  Skin: Negative for rash.  Neurological: Negative for light-headedness and headaches.  Psychiatric/Behavioral: Positive for  decreased concentration. Negative for agitation, behavioral problems, self-injury, sleep disturbance and suicidal ideas. The patient is nervous/anxious and is hyperactive.   All other systems reviewed and are negative.   Per HPI unless specifically indicated above   Allergies as of 01/03/2018   No Known Allergies     Medication List        Accurate as of 01/03/18  3:52 PM. Always use your most recent med list.          albuterol 108 (90 Base) MCG/ACT inhaler Commonly known as:  PROVENTIL HFA;VENTOLIN HFA Inhale 2 puffs into the lungs every 6 (six) hours as needed. For shortness of breath   amphetamine-dextroamphetamine 20 MG 24 hr capsule Commonly known as:  ADDERALL XR Take 1 capsule (20 mg total) by mouth daily.   cholecalciferol 1000 units tablet Commonly known as:  VITAMIN D Take 1,000 Units by mouth daily.   levothyroxine 150 MCG tablet Commonly known as:  SYNTHROID, LEVOTHROID Take 1 tablet (150 mcg total) by mouth daily.   norgestimate-ethinyl estradiol 0.25-35 MG-MCG tablet Commonly known as:  SPRINTEC 28 Take 1 tablet by mouth daily.          Objective:    BP 132/81   Pulse 82   Temp (!) 97.4 F (36.3 C) (Oral)   Ht 5' 7" (1.702 m)   Wt 139 lb (63 kg)   BMI 21.77 kg/m   Wt Readings from Last 3  Encounters:  01/03/18 139 lb (63 kg)  10/10/17 143 lb 3.2 oz (65 kg)  05/05/17 151 lb (68.5 kg)    Physical Exam  Constitutional: She is oriented to person, place, and time. She appears well-developed and well-nourished. No distress.  Eyes: Conjunctivae are normal.  Neck: Neck supple. No thyromegaly present.  Cardiovascular: Normal rate, regular rhythm, normal heart sounds and intact distal pulses.  No murmur heard. Pulmonary/Chest: Effort normal and breath sounds normal. No respiratory distress. She has no wheezes.  Musculoskeletal: Normal range of motion. She exhibits no edema.  Lymphadenopathy:    She has no cervical adenopathy.  Neurological: She is  alert and oriented to person, place, and time. Coordination normal.  Skin: Skin is warm and dry. No rash noted. She is not diaphoretic.  Psychiatric: Her mood appears anxious. She is hyperactive. Cognition and memory are normal. She does not exhibit a depressed mood. She expresses no suicidal ideation. She expresses no suicidal plans.  Nursing note and vitals reviewed.       Assessment & Plan:   Problem List Items Addressed This Visit      Endocrine   Hypothyroidism   Relevant Medications   levothyroxine (SYNTHROID, LEVOTHROID) 150 MCG tablet   Other Relevant Orders   ToxASSURE Select 13 (MW), Urine   CMP14+EGFR     Other   GAD (generalized anxiety disorder)   Adult ADHD - Primary   Relevant Medications   amphetamine-dextroamphetamine (ADDERALL XR) 20 MG 24 hr capsule   Other Relevant Orders   TSH   CMP14+EGFR    Other Visit Diagnoses    Encounter for initial prescription of contraceptive pills       Relevant Medications   norgestimate-ethinyl estradiol (SPRINTEC 28) 0.25-35 MG-MCG tablet      Will start Adderall, need urine drug screen and recheck thyroid as well today. Refill Sprintec but once patient gets insurance she would like to discuss an IUD. Follow up plan: Return in about 3 months (around 04/05/2018), or if symptoms worsen or fail to improve, for ADHD recheck and thyroid recheck.  Counseling provided for all of the vaccine components Orders Placed This Encounter  Procedures  . ToxASSURE Select 13 (MW), Urine  . TSH  . CMP14+EGFR     , MD [facility] 01/03/2018, 3:52 PM     

## 2018-01-03 NOTE — Addendum Note (Signed)
Addended by: Arville CareETTINGER, JOSHUA on: 01/03/2018 03:58 PM   Modules accepted: Orders

## 2018-01-03 NOTE — Addendum Note (Signed)
Addended by: Arville CareETTINGER, JOSHUA on: 01/03/2018 04:23 PM   Modules accepted: Orders

## 2018-01-04 ENCOUNTER — Other Ambulatory Visit: Payer: Self-pay | Admitting: *Deleted

## 2018-01-04 LAB — CMP14+EGFR
A/G RATIO: 2 (ref 1.2–2.2)
ALBUMIN: 4.7 g/dL (ref 3.5–5.5)
ALT: 10 IU/L (ref 0–32)
AST: 14 IU/L (ref 0–40)
Alkaline Phosphatase: 31 IU/L — ABNORMAL LOW (ref 39–117)
BILIRUBIN TOTAL: 0.4 mg/dL (ref 0.0–1.2)
BUN / CREAT RATIO: 10 (ref 9–23)
BUN: 10 mg/dL (ref 6–20)
CHLORIDE: 102 mmol/L (ref 96–106)
CO2: 27 mmol/L (ref 20–29)
Calcium: 8.4 mg/dL — ABNORMAL LOW (ref 8.7–10.2)
Creatinine, Ser: 0.98 mg/dL (ref 0.57–1.00)
GFR calc non Af Amer: 76 mL/min/{1.73_m2} (ref >59)
GFR, EST AFRICAN AMERICAN: 88 mL/min/{1.73_m2} (ref >59)
GLUCOSE: 42 mg/dL — AB (ref 65–99)
Globulin, Total: 2.3 g/dL (ref 1.5–4.5)
POTASSIUM: 3.6 mmol/L (ref 3.5–5.2)
SODIUM: 144 mmol/L (ref 134–144)
TOTAL PROTEIN: 7 g/dL (ref 6.0–8.5)

## 2018-01-04 LAB — TSH: TSH: 91.09 u[IU]/mL — ABNORMAL HIGH (ref 0.450–4.500)

## 2018-01-04 MED ORDER — LEVOTHYROXINE SODIUM 175 MCG PO CAPS: 175.0000 ug | ORAL_CAPSULE | Freq: Every day | ORAL | 2 refills | Status: DC

## 2018-01-05 ENCOUNTER — Other Ambulatory Visit: Payer: Self-pay | Admitting: *Deleted

## 2018-01-05 MED ORDER — LEVOTHYROXINE SODIUM 175 MCG PO TABS: 175.0000 ug | ORAL_TABLET | Freq: Every day | ORAL | 0 refills | Status: DC

## 2018-01-05 NOTE — Telephone Encounter (Signed)
Fax received [facility] Change levothyroxine to tablets from capsules Rx sent to pharmacy

## 2018-01-08 LAB — TOXASSURE SELECT 13 (MW), URINE

## 2018-01-10 ENCOUNTER — Telehealth: Payer: Self-pay | Admitting: Family Medicine

## 2018-01-10 MED ORDER — DULOXETINE HCL 30 MG PO CPEP: 30.0000 mg | ORAL_CAPSULE | Freq: Every day | ORAL | 1 refills | Status: DC

## 2018-01-10 NOTE — Telephone Encounter (Signed)
I sent Cymbalta for the patient because of her mood and to help her come down off of her Suboxone that she is buying on the street right now. Arville CareJoshua Deyja Sochacki, MD [facility] 01/10/2018, 2:28 PM

## 2018-02-13 ENCOUNTER — Other Ambulatory Visit: Payer: Self-pay | Admitting: *Deleted

## 2018-02-13 MED ORDER — DULOXETINE HCL 30 MG PO CPEP: 30.0000 mg | ORAL_CAPSULE | Freq: Every day | ORAL | 1 refills | Status: DC

## 2018-02-13 NOTE — Telephone Encounter (Signed)
Go ahead and send refill for now, if we need to increase the dose then she likely needs to be seen.  She should be coming back soon anyways.

## 2018-02-13 NOTE — Telephone Encounter (Signed)
Requesting refill on Cymbalta. She really couldn't tell a difference when taking it but she can tell that she is more tearful and emotional since she has been out for several days. She wonders if the dosage needs to be increased. [facility]

## 2018-02-13 NOTE — Telephone Encounter (Signed)
Patient aware and verbalizes understanding- rx sent

## 2018-03-07 ENCOUNTER — Ambulatory Visit (INDEPENDENT_AMBULATORY_CARE_PROVIDER_SITE_OTHER): Payer: Self-pay | Admitting: Family Medicine

## 2018-03-07 ENCOUNTER — Encounter: Payer: Self-pay | Admitting: Family Medicine

## 2018-03-07 VITALS — BP 112/65 | HR 80 | Temp 98.4°F | Ht 67.0 in | Wt 137.4 lb

## 2018-03-07 DIAGNOSIS — F909 Attention-deficit hyperactivity disorder, unspecified type: Secondary | ICD-10-CM

## 2018-03-07 DIAGNOSIS — F411 Generalized anxiety disorder: Secondary | ICD-10-CM

## 2018-03-07 DIAGNOSIS — E039 Hypothyroidism, unspecified: Secondary | ICD-10-CM

## 2018-03-07 MED ORDER — AMPHETAMINE-DEXTROAMPHET ER 30 MG PO CP24: 30.0000 mg | ORAL_CAPSULE | Freq: Every day | ORAL | 0 refills | Status: DC

## 2018-03-07 MED ORDER — DULOXETINE HCL 60 MG PO CPEP: 60.0000 mg | ORAL_CAPSULE | Freq: Every day | ORAL | 1 refills | Status: DC

## 2018-03-07 NOTE — Progress Notes (Signed)
BP 112/65   Pulse 80   Temp 98.4 F (36.9 C) (Oral)   Ht 5\' 7"  (1.702 m)   Wt 137 lb 6.4 oz (62.3 kg)   BMI 21.52 kg/m    Subjective:    Patient ID: Valerie Ayala, female    DOB: Nov 24, 1985, 32 y.o.   MRN: [id number]  HPI: Valerie Ayala is a 32 y.o. female presenting on 03/07/2018 for ADHD (2 month follow up. ); Medication Refill (cymbalta ); Nasal Congestion (Patient states that it has been going on less than a week and both her kids were sick.); and Sore Throat   HPI Anxiety and ADHD Patient is coming in for anxiety and ADHD.  She says she is improved but not where she needs to be.  She did not feel like the Cymbalta was helping her until she missed a couple days and then she definitely felt a difference.  She feels like the ADHD medication is helping but may be not enough.  She denies any suicidal ideations or thoughts of hurting herself.  She says a big factor recently as her husband is possibly dealing with a new diagnosis of lymphoma and her coming down off of the Suboxone that she was getting before has made a difference.  She says she is been off the medication for 4 weeks. Depression screen [facility] 2/9 03/07/2018 01/03/2018 10/10/2017 05/05/2017 12/24/2015  Decreased Interest 3 0 0 0 0  Down, Depressed, Hopeless 2 1 1 1  0  PHQ - 2 Score 5 1 1 1  0  Altered sleeping 0 - - - -  Tired, decreased energy 3 - - - -  Change in appetite 2 - - - -  Feeling bad or failure about yourself  0 - - - -  Trouble concentrating 1 - - - -  Moving slowly or fidgety/restless 0 - - - -  Suicidal thoughts 0 - - - -  PHQ-9 Score 11 - - - -    Hypothyroidism recheck Patient is coming in for thyroid recheck today as well. They deny any issues with hair changes or heat or cold problems or diarrhea or constipation. They deny any chest pain or palpitations. They are currently on levothyroxine   Relevant past medical, surgical, family and social history reviewed and updated as indicated. Interim  medical history since our last visit reviewed. Allergies and medications reviewed and updated.  Review of Systems  Constitutional: Negative for chills and fever.  Eyes: Negative for visual disturbance.  Respiratory: Positive for chest tightness (Sounds like anxiety attacks with husband's recent possible diagnosis of lymphoma). Negative for shortness of breath.   Cardiovascular: Negative for chest pain and leg swelling.  Musculoskeletal: Negative for back pain and gait problem.  Skin: Negative for rash.  Neurological: Negative for light-headedness and headaches.  Psychiatric/Behavioral: Positive for decreased concentration and dysphoric mood. Negative for agitation, behavioral problems, self-injury, sleep disturbance and suicidal ideas. The patient is nervous/anxious.   All other systems reviewed and are negative.   Per HPI unless specifically indicated above   Allergies as of 03/07/2018   No Known Allergies     Medication List        Accurate as of 03/07/18  1:46 PM. Always use your most recent med list.          albuterol 108 (90 Base) MCG/ACT inhaler Commonly known as:  PROVENTIL HFA;VENTOLIN HFA Inhale 2 puffs into the lungs every 6 (six) hours as needed. For shortness of  breath   amphetamine-dextroamphetamine 20 MG 24 hr capsule Commonly known as:  ADDERALL XR Take 1 capsule (20 mg total) by mouth daily.   amphetamine-dextroamphetamine 20 MG 24 hr capsule Commonly known as:  ADDERALL XR Take 1 capsule (20 mg total) by mouth daily. Do not refill until 30 days from prescription date   cholecalciferol 1000 units tablet Commonly known as:  VITAMIN D Take 1,000 Units by mouth daily.   DULoxetine 30 MG capsule Commonly known as:  CYMBALTA Take 1 capsule (30 mg total) by mouth daily.   levothyroxine 175 MCG tablet Commonly known as:  SYNTHROID, LEVOTHROID Take 1 tablet (175 mcg total) by mouth daily before breakfast.   norgestimate-ethinyl estradiol 0.25-35 MG-MCG  tablet Commonly known as:  ORTHO-CYCLEN,SPRINTEC,PREVIFEM Take 1 tablet by mouth daily.          Objective:    BP 112/65   Pulse 80   Temp 98.4 F (36.9 C) (Oral)   Ht 5\' 7"  (1.702 m)   Wt 137 lb 6.4 oz (62.3 kg)   BMI 21.52 kg/m   Wt Readings from Last 3 Encounters:  03/07/18 137 lb 6.4 oz (62.3 kg)  01/03/18 139 lb (63 kg)  10/10/17 143 lb 3.2 oz (65 kg)    Physical Exam  Constitutional: She is oriented to person, place, and time. She appears well-developed and well-nourished. No distress.  Eyes: Pupils are equal, round, and reactive to light. Conjunctivae and EOM are normal.  Cardiovascular: Normal rate, regular rhythm, normal heart sounds and intact distal pulses.  No murmur heard. Pulmonary/Chest: Effort normal and breath sounds normal. No respiratory distress. She has no wheezes.  Musculoskeletal: Normal range of motion. She exhibits no edema or tenderness.  Neurological: She is alert and oriented to person, place, and time. Coordination normal.  Skin: Skin is warm and dry. No rash noted. She is not diaphoretic.  Psychiatric: Her behavior is normal. Her mood appears anxious. She exhibits a depressed mood. She expresses no suicidal ideation. She expresses no suicidal plans.  Nursing note and vitals reviewed.       Assessment & Plan:   Problem List Items Addressed This Visit      Endocrine   Hypothyroidism   Relevant Orders   TSH     Other   GAD (generalized anxiety disorder) - Primary   Relevant Medications   DULoxetine (CYMBALTA) 60 MG capsule   Adult ADHD   Relevant Orders   ToxASSURE Select 13 (MW), Urine     We will run tox assure again, patient had a bad toxic Sarahn last time.  Will increase Cymbalta and see how it goes for.  Will increase Adderall to 30.  Patient says she should be clean on her tox assure today.  Follow up plan: Return in about 1 month (around 04/06/2018), or if symptoms worsen or fail to improve, for ADHD and anxiety  recheck.  Counseling provided for all of the vaccine components Orders Placed This Encounter  Procedures  . ToxASSURE Select 13 (MW), Urine  . TSH    Arville Care, MD [facility] 03/07/2018, 1:46 PM

## 2018-03-08 LAB — TSH: TSH: 2.41 u[IU]/mL (ref 0.450–4.500)

## 2018-03-13 LAB — TOXASSURE SELECT 13 (MW), URINE

## 2018-04-04 ENCOUNTER — Ambulatory Visit: Payer: Self-pay | Admitting: Family Medicine

## 2018-04-05 ENCOUNTER — Ambulatory Visit: Payer: Self-pay | Admitting: Family Medicine

## 2018-04-12 ENCOUNTER — Encounter: Payer: Self-pay | Admitting: Family Medicine

## 2018-04-26 ENCOUNTER — Encounter: Payer: Self-pay | Admitting: Family Medicine

## 2018-04-26 ENCOUNTER — Ambulatory Visit (INDEPENDENT_AMBULATORY_CARE_PROVIDER_SITE_OTHER): Payer: Self-pay | Admitting: Family Medicine

## 2018-04-26 VITALS — BP 118/64 | HR 73 | Temp 97.9°F | Ht 67.0 in | Wt 137.6 lb

## 2018-04-26 DIAGNOSIS — F909 Attention-deficit hyperactivity disorder, unspecified type: Secondary | ICD-10-CM

## 2018-04-26 DIAGNOSIS — E039 Hypothyroidism, unspecified: Secondary | ICD-10-CM

## 2018-04-26 DIAGNOSIS — F331 Major depressive disorder, recurrent, moderate: Secondary | ICD-10-CM

## 2018-04-26 DIAGNOSIS — F339 Major depressive disorder, recurrent, unspecified: Secondary | ICD-10-CM | POA: Insufficient documentation

## 2018-04-26 DIAGNOSIS — F411 Generalized anxiety disorder: Secondary | ICD-10-CM

## 2018-04-26 MED ORDER — AMPHETAMINE-DEXTROAMPHET ER 30 MG PO CP24: 30.0000 mg | ORAL_CAPSULE | Freq: Every day | ORAL | 0 refills | Status: DC

## 2018-04-26 MED ORDER — DULOXETINE HCL 30 MG PO CPEP: 30.0000 mg | ORAL_CAPSULE | Freq: Every day | ORAL | 0 refills | Status: DC

## 2018-04-26 MED ORDER — HYDROXYZINE HCL 25 MG PO TABS: 25.0000 mg | ORAL_TABLET | Freq: Two times a day (BID) | ORAL | 1 refills | Status: DC | PRN

## 2018-04-26 MED ORDER — FLUOXETINE HCL 20 MG PO TABS: 20.0000 mg | ORAL_TABLET | Freq: Every day | ORAL | 1 refills | Status: DC

## 2018-04-26 NOTE — Progress Notes (Signed)
BP 118/64   Pulse 73   Temp 97.9 F (36.6 C) (Oral)   Ht 5\' 7"  (1.702 m)   Wt 137 lb 9.6 oz (62.4 kg)   BMI 21.55 kg/m    Subjective:    Patient ID: Valerie Ayala, female    DOB: Mar 31, 1986, 32 y.o.   MRN: [id number]  HPI: Valerie Ayala is a 32 y.o. female presenting on 04/26/2018 for Anxiety (Patient states she does not want to be on the cymbalta due to how it makes her feel. States it makes her more depressed then she was before.); ADHD (Would like to get on clonidine to help her sleep at night.); and Foot Swelling (bilateral x 2 months)   HPI Anxiety depression recheck She is coming in for anxiety and depression recheck.  She has been on Cymbalta and just does not feel like it is helping at all in fact she feels like things have been worse and she would like to try something different.  She talked to some family members and friends and they had said Prozac worked really well for them and she would like to try Prozac.  She says that she does not have any suicidal ideations but is feeling increased depression and sadness and crying.  She said a lot of it stems from her child is having some troubles in school but is doing better because of his ADHD and behavioral disorders. Depression screen [facility] 2/9 04/26/2018 03/07/2018 01/03/2018 10/10/2017 05/05/2017  Decreased Interest 3 3 0 0 0  Down, Depressed, Hopeless 3 2 1 1 1   PHQ - 2 Score 6 5 1 1 1   Altered sleeping 3 0 - - -  Tired, decreased energy 3 3 - - -  Change in appetite 3 2 - - -  Feeling bad or failure about yourself  3 0 - - -  Trouble concentrating 1 1 - - -  Moving slowly or fidgety/restless 1 0 - - -  Suicidal thoughts 0 0 - - -  PHQ-9 Score 20 11 - - -     Patient is also on ADHD medications which runs extensively and her family and says that they are working well for her at this point.  She says she still has an appetite and is not losing too much weight.  Hypothyroidism recheck Patient is coming in for thyroid recheck  today as well. They deny any issues with hair changes or heat or cold problems or diarrhea or constipation. They deny any chest pain or palpitations. They are currently on levothyroxine 175 micrograms   Relevant past medical, surgical, family and social history reviewed and updated as indicated. Interim medical history since our last visit reviewed. Allergies and medications reviewed and updated.  Review of Systems  Constitutional: Negative for chills and fever.  Eyes: Negative for visual disturbance.  Respiratory: Negative for chest tightness and shortness of breath.   Cardiovascular: Negative for chest pain and leg swelling.  Musculoskeletal: Negative for back pain and gait problem.  Skin: Negative for rash.  Neurological: Negative for light-headedness and headaches.  Psychiatric/Behavioral: Positive for decreased concentration and dysphoric mood. Negative for agitation, behavioral problems, self-injury, sleep disturbance and suicidal ideas. The patient is nervous/anxious.   All other systems reviewed and are negative.   Per HPI unless specifically indicated above   Allergies as of 04/26/2018   No Known Allergies     Medication List        Accurate as of 04/26/18 12:19 PM.  Always use your most recent med list.          albuterol 108 (90 Base) MCG/ACT inhaler Commonly known as:  PROVENTIL HFA;VENTOLIN HFA Inhale 2 puffs into the lungs every 6 (six) hours as needed. For shortness of breath   amphetamine-dextroamphetamine 30 MG 24 hr capsule Commonly known as:  ADDERALL XR Take 1 capsule (30 mg total) by mouth daily.   cholecalciferol 1000 units tablet Commonly known as:  VITAMIN D Take 1,000 Units by mouth daily.   DULoxetine 30 MG capsule Commonly known as:  CYMBALTA Take 1 capsule (30 mg total) by mouth daily.   FLUoxetine 20 MG tablet Commonly known as:  PROZAC Take 1 tablet (20 mg total) by mouth daily.   hydrOXYzine 25 MG tablet Commonly known as:   ATARAX/VISTARIL Take 1 tablet (25 mg total) by mouth 2 (two) times daily as needed.   levothyroxine 175 MCG tablet Commonly known as:  SYNTHROID, LEVOTHROID Take 1 tablet (175 mcg total) by mouth daily before breakfast.   norgestimate-ethinyl estradiol 0.25-35 MG-MCG tablet Commonly known as:  ORTHO-CYCLEN,SPRINTEC,PREVIFEM Take 1 tablet by mouth daily.          Objective:    BP 118/64   Pulse 73   Temp 97.9 F (36.6 C) (Oral)   Ht 5\' 7"  (1.702 m)   Wt 137 lb 9.6 oz (62.4 kg)   BMI 21.55 kg/m   Wt Readings from Last 3 Encounters:  04/26/18 137 lb 9.6 oz (62.4 kg)  03/07/18 137 lb 6.4 oz (62.3 kg)  01/03/18 139 lb (63 kg)    Physical Exam  Constitutional: She is oriented to person, place, and time. She appears well-developed and well-nourished. No distress.  Eyes: Conjunctivae are normal.  Neck: Neck supple. No thyromegaly present.  Cardiovascular: Normal rate, regular rhythm, normal heart sounds and intact distal pulses.  No murmur heard. Pulmonary/Chest: Effort normal and breath sounds normal. No respiratory distress. She has no wheezes.  Lymphadenopathy:    She has no cervical adenopathy.  Neurological: She is alert and oriented to person, place, and time. Coordination normal.  Skin: Skin is warm and dry. No rash noted. She is not diaphoretic.  Psychiatric: Her behavior is normal. Her mood appears anxious. She exhibits a depressed mood. She expresses no suicidal ideation. She expresses no suicidal plans.  Nursing note and vitals reviewed.     Assessment & Plan:   Problem List Items Addressed This Visit      Endocrine   Hypothyroidism     Other   GAD (generalized anxiety disorder) - Primary   Relevant Medications   DULoxetine (CYMBALTA) 30 MG capsule   hydrOXYzine (ATARAX/VISTARIL) 25 MG tablet   FLUoxetine (PROZAC) 20 MG tablet   Adult ADHD   Relevant Medications   amphetamine-dextroamphetamine (ADDERALL XR) 30 MG 24 hr capsule   Major depression,  recurrent (HCC)   Relevant Medications   DULoxetine (CYMBALTA) 30 MG capsule   hydrOXYzine (ATARAX/VISTARIL) 25 MG tablet   FLUoxetine (PROZAC) 20 MG tablet      Follow up plan: Return in about 4 weeks (around 05/24/2018), or if symptoms worsen or fail to improve, for ADHD recheck.  Counseling provided for all of the vaccine components No orders of the defined types were placed in this encounter.   Arville Care, MD [facility] 04/26/2018, 12:19 PM

## 2018-05-18 ENCOUNTER — Other Ambulatory Visit: Payer: Self-pay | Admitting: *Deleted

## 2018-05-18 ENCOUNTER — Other Ambulatory Visit: Payer: Self-pay | Admitting: Family Medicine

## 2018-05-18 MED ORDER — FLUOXETINE HCL 20 MG PO CAPS: 20.0000 mg | ORAL_CAPSULE | Freq: Every day | ORAL | 1 refills | Status: DC

## 2018-05-30 ENCOUNTER — Encounter: Payer: Self-pay | Admitting: Family Medicine

## 2018-05-30 ENCOUNTER — Ambulatory Visit (INDEPENDENT_AMBULATORY_CARE_PROVIDER_SITE_OTHER): Payer: Self-pay | Admitting: Family Medicine

## 2018-05-30 VITALS — BP 116/68 | HR 73 | Temp 97.2°F | Ht 67.0 in | Wt 146.0 lb

## 2018-05-30 DIAGNOSIS — E039 Hypothyroidism, unspecified: Secondary | ICD-10-CM

## 2018-05-30 DIAGNOSIS — F331 Major depressive disorder, recurrent, moderate: Secondary | ICD-10-CM

## 2018-05-30 DIAGNOSIS — F909 Attention-deficit hyperactivity disorder, unspecified type: Secondary | ICD-10-CM

## 2018-05-30 MED ORDER — AMPHETAMINE-DEXTROAMPHET ER 30 MG PO CP24: 30.0000 mg | ORAL_CAPSULE | Freq: Every day | ORAL | 0 refills | Status: DC

## 2018-05-30 MED ORDER — FLUOXETINE HCL 20 MG PO CAPS: 20.0000 mg | ORAL_CAPSULE | Freq: Every day | ORAL | 1 refills | Status: DC

## 2018-05-30 MED ORDER — HYDROXYZINE HCL 25 MG PO TABS: 25.0000 mg | ORAL_TABLET | Freq: Two times a day (BID) | ORAL | 1 refills | Status: DC | PRN

## 2018-05-30 MED ORDER — LEVOTHYROXINE SODIUM 175 MCG PO TABS: 175.0000 ug | ORAL_TABLET | Freq: Every day | ORAL | 1 refills | Status: DC

## 2018-05-30 NOTE — Progress Notes (Signed)
BP 116/68   Pulse 73   Temp (!) 97.2 F (36.2 C) (Oral)   Ht 5\' 7"  (1.702 m)   Wt 146 lb (66.2 kg)   BMI 22.87 kg/m    Subjective:    Patient ID: Valerie Ayala, female    DOB: 1986/05/22, 32 y.o.   MRN: [id number]  HPI: Valerie Ayala is a 32 y.o. female presenting on 05/30/2018 for Anxiety (follow up) and ADHD   HPI Hypothyroidism recheck Patient is coming in for thyroid recheck today as well. They deny any issues with hair changes or heat or cold problems or diarrhea or constipation. They deny any chest pain or palpitations. They are currently on levothyroxine 175micrograms, she has been out of her medication for almost a week and she is feeling the difference now but was feeling good on the dose that she was on previously  Depression and ADHD Patient is coming in for recheck of ADHD and depression.  She is currently on Prozac and Adderall and says both are working well for her except she did run out of her medication a week ago.  Patient says the medication has been working well and she is on and she only let it lapse because she forgot to make an appointment to come back to see us. [facility] 05/30/2018 04/26/2018 03/07/2018 01/03/2018 10/10/2017  Decreased Interest 1 3 3  0 0  Down, Depressed, Hopeless 1 3 2 1 1   PHQ - 2 Score 2 6 5 1 1   Altered sleeping 1 3 0 - -  Tired, decreased energy 1 3 3  - -  Change in appetite 0 3 2 - -  Feeling bad or failure about yourself  1 3 0 - -  Trouble concentrating 1 1 1  - -  Moving slowly or fidgety/restless 1 1 0 - -  Suicidal thoughts 0 0 0 - -  PHQ-9 Score 7 20 11  - -     Relevant past medical, surgical, family and social history reviewed and updated as indicated. Interim medical history since our last visit reviewed. Allergies and medications reviewed and updated.  Review of Systems  Constitutional: Negative for chills and fever.  Eyes: Negative for visual disturbance.  Respiratory: Negative for chest tightness and  shortness of breath.   Cardiovascular: Negative for chest pain and leg swelling.  Musculoskeletal: Negative for back pain and gait problem.  Skin: Negative for rash.  Neurological: Negative for light-headedness and headaches.  Psychiatric/Behavioral: Positive for decreased concentration and dysphoric mood. Negative for agitation, behavioral problems, self-injury, sleep disturbance and suicidal ideas. The patient is nervous/anxious.   All other systems reviewed and are negative.   Per HPI unless specifically indicated above   Allergies as of 05/30/2018   No Known Allergies     Medication List        Accurate as of 05/30/18  4:49 PM. Always use your most recent med list.          albuterol 108 (90 Base) MCG/ACT inhaler Commonly known as:  PROVENTIL HFA;VENTOLIN HFA Inhale 2 puffs into the lungs every 6 (six) hours as needed. For shortness of breath   amphetamine-dextroamphetamine 30 MG 24 hr capsule Commonly known as:  ADDERALL XR Take 1 capsule (30 mg total) by mouth daily.   cholecalciferol 1000 units tablet Commonly known as:  VITAMIN D Take 1,000 Units by mouth daily.   FLUoxetine 20 MG capsule Commonly known as:  PROZAC Take 1 capsule (20 mg total) by  mouth daily.   hydrOXYzine 25 MG tablet Commonly known as:  ATARAX/VISTARIL TAKE 1 TABLET (25 MG TOTAL) BY MOUTH 2 (TWO) TIMES DAILY AS NEEDED.   levothyroxine 175 MCG tablet Commonly known as:  SYNTHROID, LEVOTHROID Take 1 tablet (175 mcg total) by mouth daily before breakfast.   norgestimate-ethinyl estradiol 0.25-35 MG-MCG tablet Commonly known as:  ORTHO-CYCLEN,SPRINTEC,PREVIFEM Take 1 tablet by mouth daily.          Objective:    BP 116/68   Pulse 73   Temp (!) 97.2 F (36.2 C) (Oral)   Ht 5\' 7"  (1.702 m)   Wt 146 lb (66.2 kg)   BMI 22.87 kg/m   Wt Readings from Last 3 Encounters:  05/30/18 146 lb (66.2 kg)  04/26/18 137 lb 9.6 oz (62.4 kg)  03/07/18 137 lb 6.4 oz (62.3 kg)    Physical Exam    Constitutional: She is oriented to person, place, and time. She appears well-developed and well-nourished. No distress.  Eyes: Conjunctivae are normal.  Neck: Neck supple. No thyromegaly present.  Cardiovascular: Normal rate, regular rhythm, normal heart sounds and intact distal pulses.  No murmur heard. Pulmonary/Chest: Effort normal and breath sounds normal. No respiratory distress. She has no wheezes.  Musculoskeletal: Normal range of motion. She exhibits no edema.  Lymphadenopathy:    She has no cervical adenopathy.  Neurological: She is alert and oriented to person, place, and time. Coordination normal.  Skin: Skin is warm and dry. No rash noted. She is not diaphoretic.  Psychiatric: She has a normal mood and affect. Her behavior is normal.  Nursing note and vitals reviewed.       Assessment & Plan:   Problem List Items Addressed This Visit      Endocrine   Hypothyroidism   Relevant Medications   levothyroxine (SYNTHROID, LEVOTHROID) 175 MCG tablet   Other Relevant Orders   TSH (Completed)     Other   Adult ADHD - Primary   Relevant Medications   amphetamine-dextroamphetamine (ADDERALL XR) 30 MG 24 hr capsule   Other Relevant Orders   ToxASSURE Select 13 (MW), Urine (Completed)   Major depression, recurrent (HCC)   Relevant Medications   hydrOXYzine (ATARAX/VISTARIL) 25 MG tablet   FLUoxetine (PROZAC) 20 MG capsule      Patient had been out of her Adderall for 1 week so likely will not show up on her drug screen  We will recheck thyroid today but she is also been out of that for a week as well  Follow up plan: Return in about 3 months (around 08/29/2018), or if symptoms worsen or fail to improve, for Thyroid and ADHD and depression.  Counseling provided for all of the vaccine components No orders of the defined types were placed in this encounter.   Arville Care, MD [facility] 05/30/2018, 4:49 PM

## 2018-05-31 LAB — TSH: TSH: 6.99 u[IU]/mL — ABNORMAL HIGH (ref 0.450–4.500)

## 2018-06-03 LAB — TOXASSURE SELECT 13 (MW), URINE

## 2018-08-08 ENCOUNTER — Encounter: Payer: Self-pay | Admitting: Family Medicine

## 2018-08-08 ENCOUNTER — Ambulatory Visit (INDEPENDENT_AMBULATORY_CARE_PROVIDER_SITE_OTHER): Payer: Self-pay | Admitting: Family Medicine

## 2018-08-08 VITALS — BP 116/58 | HR 75 | Temp 98.3°F | Ht 67.0 in | Wt 141.4 lb

## 2018-08-08 DIAGNOSIS — F331 Major depressive disorder, recurrent, moderate: Secondary | ICD-10-CM

## 2018-08-08 DIAGNOSIS — F411 Generalized anxiety disorder: Secondary | ICD-10-CM

## 2018-08-08 DIAGNOSIS — E039 Hypothyroidism, unspecified: Secondary | ICD-10-CM

## 2018-08-08 MED ORDER — FLUOXETINE HCL 20 MG PO CAPS: 20.0000 mg | ORAL_CAPSULE | Freq: Every day | ORAL | 5 refills | Status: DC

## 2018-08-08 MED ORDER — ARIPIPRAZOLE 5 MG PO TABS: 5.0000 mg | ORAL_TABLET | Freq: Every day | ORAL | 1 refills | Status: DC

## 2018-08-08 NOTE — Progress Notes (Signed)
BP (!) 116/58   Pulse 75   Temp 98.3 F (36.8 C) (Oral)   Ht 5\' 7"  (1.702 m)   Wt 141 lb 6.4 oz (64.1 kg)   BMI 22.15 kg/m    Subjective:    Patient ID: Valerie Ayala, female    DOB: 01/17/1986, 33 y.o.   MRN: [id number]  HPI: Valerie Ayala is a 33 y.o. female presenting on 08/08/2018 for ADHD (2 month follow up); Anxiety; Hypothyroidism; and Depression (Patient states that she would like to switch off the prozac )   HPI Anxiety depression and possible hypomanic episode Patient is coming in to discuss anxiety and depression with her sister who says that she has had some possible hypomanic episodes and was concerned about that.  She is currently on Adderall and Prozac and thinks that Adderall has not been helping her and is not been doing well and the Prozac had helped some with her depression but not with the hypomanic episodes that she thinks she has had.  She denies any suicidal ideations or thoughts of hurting herself.  The hypomanic episodes or bipolar does run in her family as her mother had it as well. [facility] 08/08/2018 05/30/2018 04/26/2018 03/07/2018 01/03/2018  Decreased Interest 3 1 3 3  0  Down, Depressed, Hopeless 3 1 3 2 1   PHQ - 2 Score 6 2 6 5 1   Altered sleeping 3 1 3  0 -  Tired, decreased energy 3 1 3 3  -  Change in appetite 2 0 3 2 -  Feeling bad or failure about yourself  3 1 3  0 -  Trouble concentrating 2 1 1 1  -  Moving slowly or fidgety/restless 1 1 1  0 -  Suicidal thoughts 1 0 0 0 -  PHQ-9 Score 21 7 20 11  -     Hypothyroidism recheck Patient is coming in for thyroid recheck today as well. They deny any issues with hair changes or heat or cold problems or diarrhea or constipation. They deny any chest pain or palpitations. They are currently on levothyroxine 150 micrograms   Relevant past medical, surgical, family and social history reviewed and updated as indicated. Interim medical history since our last visit reviewed. Allergies and  medications reviewed and updated.  Review of Systems  Constitutional: Negative for chills and fever.  HENT: Negative for congestion, ear discharge and ear pain.   Eyes: Negative for redness and visual disturbance.  Respiratory: Negative for chest tightness and shortness of breath.   Cardiovascular: Negative for chest pain and leg swelling.  Musculoskeletal: Negative for back pain and gait problem.  Skin: Negative for rash.  Neurological: Negative for dizziness, light-headedness and headaches.  Psychiatric/Behavioral: Positive for dysphoric mood and sleep disturbance. Negative for agitation, behavioral problems, self-injury and suicidal ideas. The patient is nervous/anxious.   All other systems reviewed and are negative.   Per HPI unless specifically indicated above   Allergies as of 08/08/2018   No Known Allergies     Medication List       Accurate as of August 08, 2018 11:59 PM. Always use your most recent med list.        albuterol 108 (90 Base) MCG/ACT inhaler Commonly known as:  PROVENTIL HFA;VENTOLIN HFA Inhale 2 puffs into the lungs every 6 (six) hours as needed. For shortness of breath   ARIPiprazole 5 MG tablet Commonly known as:  ABILIFY Take 1 tablet (5 mg total) by mouth daily.   cholecalciferol 1000 units  tablet Commonly known as:  VITAMIN D Take 1,000 Units by mouth daily.   FLUoxetine 20 MG capsule Commonly known as:  PROZAC Take 1 capsule (20 mg total) by mouth daily.   hydrOXYzine 25 MG tablet Commonly known as:  ATARAX/VISTARIL Take 1 tablet (25 mg total) by mouth 2 (two) times daily as needed.   levothyroxine 150 MCG tablet Commonly known as:  SYNTHROID, LEVOTHROID Take 1 tablet (150 mcg total) by mouth daily.   norgestimate-ethinyl estradiol 0.25-35 MG-MCG tablet Commonly known as:  SPRINTEC 28 Take 1 tablet by mouth daily.          Objective:    BP (!) 116/58   Pulse 75   Temp 98.3 F (36.8 C) (Oral)   Ht 5\' 7"  (1.702 m)   Wt  141 lb 6.4 oz (64.1 kg)   BMI 22.15 kg/m   Wt Readings from Last 3 Encounters:  08/08/18 141 lb 6.4 oz (64.1 kg)  05/30/18 146 lb (66.2 kg)  04/26/18 137 lb 9.6 oz (62.4 kg)    Physical Exam Vitals signs and nursing note reviewed.  Constitutional:      General: She is not in acute distress.    Appearance: She is well-developed. She is not diaphoretic.  Eyes:     Conjunctiva/sclera: Conjunctivae normal.  Cardiovascular:     Rate and Rhythm: Normal rate and regular rhythm.     Heart sounds: Normal heart sounds. No murmur.  Pulmonary:     Effort: Pulmonary effort is normal. No respiratory distress.     Breath sounds: Normal breath sounds. No wheezing.  Musculoskeletal: Normal range of motion.        General: No tenderness.  Skin:    General: Skin is warm and dry.     Findings: No rash.  Neurological:     Mental Status: She is alert and oriented to person, place, and time.     Coordination: Coordination normal.  Psychiatric:        Mood and Affect: Mood is anxious and depressed.        Speech: Speech is tangential.        Behavior: Behavior normal.        Thought Content: Thought content does not include suicidal ideation. Thought content does not include suicidal plan.     Results for orders placed or performed in visit on 08/08/18  TSH  Result Value Ref Range   TSH 0.037 (L) 0.450 - 4.500 uIU/mL      Assessment & Plan:   Problem List Items Addressed This Visit      Endocrine   Hypothyroidism - Primary   Relevant Medications   levothyroxine (SYNTHROID, LEVOTHROID) 150 MCG tablet   Other Relevant Orders   TSH (Completed)     Other   GAD (generalized anxiety disorder)   Relevant Medications   FLUoxetine (PROZAC) 20 MG capsule   ARIPiprazole (ABILIFY) 5 MG tablet   Major depression, recurrent (HCC)   Relevant Medications   FLUoxetine (PROZAC) 20 MG capsule   ARIPiprazole (ABILIFY) 5 MG tablet      Will start Abilify, continue Prozac for now, stop  Adderall  Continue thyroid dose and will recheck TSH today Follow up plan: Return in about 4 weeks (around 09/05/2018), or if symptoms worsen or fail to improve, for Depression and anxiety recheck.  Counseling provided for all of the vaccine components Orders Placed This Encounter  Procedures  . TSH    Arville Care, MD [facility] 08/12/2018,  11:36 AM

## 2018-08-09 LAB — TSH: TSH: 0.037 u[IU]/mL — ABNORMAL LOW (ref 0.450–4.500)

## 2018-08-09 MED ORDER — LEVOTHYROXINE SODIUM 150 MCG PO TABS: 150.0000 ug | ORAL_TABLET | Freq: Every day | ORAL | 1 refills | Status: DC

## 2018-08-29 ENCOUNTER — Ambulatory Visit: Payer: Self-pay | Admitting: Family Medicine

## 2018-08-30 ENCOUNTER — Encounter: Payer: Self-pay | Admitting: Family Medicine

## 2018-09-04 ENCOUNTER — Telehealth: Payer: Self-pay

## 2018-09-04 NOTE — Telephone Encounter (Signed)
Patient requesting refill on Abilify until appt on Monday. Made aware that script written last month had one additional refill. She should be able to fill it at [facility] without a new script from [place].

## 2018-09-10 ENCOUNTER — Ambulatory Visit (INDEPENDENT_AMBULATORY_CARE_PROVIDER_SITE_OTHER): Payer: Self-pay | Admitting: Family Medicine

## 2018-09-10 ENCOUNTER — Other Ambulatory Visit: Payer: Self-pay

## 2018-09-10 ENCOUNTER — Other Ambulatory Visit: Payer: Self-pay | Admitting: *Deleted

## 2018-09-10 ENCOUNTER — Other Ambulatory Visit: Payer: Self-pay | Admitting: Family Medicine

## 2018-09-10 ENCOUNTER — Encounter: Payer: Self-pay | Admitting: Family Medicine

## 2018-09-10 VITALS — BP 115/78 | HR 74 | Temp 97.8°F | Ht 67.0 in | Wt 143.2 lb

## 2018-09-10 DIAGNOSIS — F411 Generalized anxiety disorder: Secondary | ICD-10-CM

## 2018-09-10 DIAGNOSIS — F331 Major depressive disorder, recurrent, moderate: Secondary | ICD-10-CM

## 2018-09-10 DIAGNOSIS — F909 Attention-deficit hyperactivity disorder, unspecified type: Secondary | ICD-10-CM

## 2018-09-10 DIAGNOSIS — E039 Hypothyroidism, unspecified: Secondary | ICD-10-CM

## 2018-09-10 MED ORDER — AMPHETAMINE-DEXTROAMPHET ER 30 MG PO CP24: 30.0000 mg | ORAL_CAPSULE | Freq: Every day | ORAL | 0 refills | Status: DC

## 2018-09-10 MED ORDER — ARIPIPRAZOLE 5 MG PO TABS: 5.0000 mg | ORAL_TABLET | Freq: Every day | ORAL | 5 refills | Status: DC

## 2018-09-10 NOTE — Progress Notes (Signed)
BP 115/78   Pulse 74   Temp 97.8 F (36.6 C) (Oral)   Ht 5\' 7"  (1.702 m)   Wt 143 lb 3.2 oz (65 kg)   BMI 22.43 kg/m    Subjective:    Patient ID: Valerie Ayala, female    DOB: [birth year], 33 y.o.   MRN: [id number]  HPI: Valerie Ayala is a 33 y.o. female presenting on 09/10/2018 for Depression (1 month follow up)   HPI Hypothyroidism recheck Patient is coming in for thyroid recheck today as well. They deny any issues with hair changes or heat or cold problems or diarrhea or constipation. They deny any chest pain or palpitations. They are currently on levothyroxine   Patient is coming in today for recheck of her anxiety and depression and ADHD as well.  She says her anxiety depression is doing very well and since it is much more controlled she is still noticing that ADHD is coming back.  She is been off her Adderall because she was concerned about how it interacted and she would like to try a lower dose of it and see how it does for her.  She says she is having a lot more focus and attention issues than she was having previously.  Relevant past medical, surgical, family and social history reviewed and updated as indicated. Interim medical history since our last visit reviewed. Allergies and medications reviewed and updated.  Review of Systems  Constitutional: Negative for chills and fever.  Eyes: Negative for visual disturbance.  Respiratory: Negative for chest tightness and shortness of breath.   Cardiovascular: Negative for chest pain and leg swelling.  Musculoskeletal: Negative for back pain and gait problem.  Skin: Negative for rash.  Neurological: Negative for dizziness, light-headedness and headaches.  Psychiatric/Behavioral: Negative for agitation and behavioral problems.  All other systems reviewed and are negative.   Per HPI unless specifically indicated above   Allergies as of 09/10/2018   No Known Allergies     Medication List       Accurate as of  September 10, 2018  9:58 AM. Always use your most recent med list.        albuterol 108 (90 Base) MCG/ACT inhaler Commonly known as:  PROVENTIL HFA;VENTOLIN HFA Inhale 2 puffs into the lungs every 6 (six) hours as needed. For shortness of breath   amphetamine-dextroamphetamine 30 MG 24 hr capsule Commonly known as:  Adderall XR Take 1 capsule (30 mg total) by mouth daily.   amphetamine-dextroamphetamine 30 MG 24 hr capsule Commonly known as:  Adderall XR Take 1 capsule (30 mg total) by mouth daily. Do not refill until 30 days from prescription date   ARIPiprazole 5 MG tablet Commonly known as:  Abilify Take 1 tablet (5 mg total) by mouth daily.   cholecalciferol 1000 units tablet Commonly known as:  VITAMIN D Take 1,000 Units by mouth daily.   FLUoxetine 20 MG capsule Commonly known as:  PROzac Take 1 capsule (20 mg total) by mouth daily.   hydrOXYzine 25 MG tablet Commonly known as:  ATARAX/VISTARIL Take 1 tablet (25 mg total) by mouth 2 (two) times daily as needed.   levothyroxine 150 MCG tablet Commonly known as:  SYNTHROID, LEVOTHROID Take 1 tablet (150 mcg total) by mouth daily.   norgestimate-ethinyl estradiol 0.25-35 MG-MCG tablet Commonly known as:  Sprintec 28 Take 1 tablet by mouth daily.          Objective:    BP 115/78  Pulse 74   Temp 97.8 F (36.6 C) (Oral)   Ht 5\' 7"  (1.702 m)   Wt 143 lb 3.2 oz (65 kg)   BMI 22.43 kg/m   Wt Readings from Last 3 Encounters:  09/10/18 143 lb 3.2 oz (65 kg)  08/08/18 141 lb 6.4 oz (64.1 kg)  05/30/18 146 lb (66.2 kg)    Physical Exam Vitals signs and nursing note reviewed.  Constitutional:      General: She is not in acute distress.    Appearance: She is well-developed. She is not diaphoretic.  Eyes:     Conjunctiva/sclera: Conjunctivae normal.  Cardiovascular:     Rate and Rhythm: Normal rate and regular rhythm.     Heart sounds: Normal heart sounds. No murmur.  Pulmonary:     Effort: Pulmonary effort  is normal. No respiratory distress.     Breath sounds: Normal breath sounds. No wheezing.  Musculoskeletal: Normal range of motion.        General: No tenderness.  Skin:    General: Skin is warm and dry.     Findings: No rash.  Neurological:     Mental Status: She is alert and oriented to person, place, and time.     Coordination: Coordination normal.  Psychiatric:        Attention and Perception: She is inattentive.        Mood and Affect: Mood is anxious and depressed.        Behavior: Behavior normal.        Thought Content: Thought content does not include suicidal ideation. Thought content does not include suicidal plan.         Assessment & Plan:   Problem List Items Addressed This Visit      Endocrine   Hypothyroidism     Other   GAD (generalized anxiety disorder)   Relevant Medications   ARIPiprazole (ABILIFY) 5 MG tablet   Adult ADHD   Relevant Medications   amphetamine-dextroamphetamine (ADDERALL XR) 30 MG 24 hr capsule   Major depression, recurrent (HCC) - Primary   Relevant Medications   ARIPiprazole (ABILIFY) 5 MG tablet       Follow up plan: Return in about 8 years (around 09/10/2026), or if symptoms worsen or fail to improve, for Depression anxiety and thyroid.  Counseling provided for all of the vaccine components No orders of the defined types were placed in this encounter.   Arville Care, MD [facility] 09/10/2018, 9:58 AM

## 2018-09-10 NOTE — Telephone Encounter (Signed)
It looks like 1 of the 2 accidentally went to [place], I sent it to [facility] but coming canceled the [facility].

## 2018-09-10 NOTE — Telephone Encounter (Signed)
WM called and rx cancelled

## 2018-09-11 MED ORDER — AMPHETAMINE-DEXTROAMPHET ER 30 MG PO CP24: 30.0000 mg | ORAL_CAPSULE | Freq: Every day | ORAL | 0 refills | Status: DC

## 2018-09-11 NOTE — Progress Notes (Signed)
Sent refill for patient to correct pharmacy

## 2018-10-11 ENCOUNTER — Other Ambulatory Visit: Payer: Self-pay | Admitting: Family Medicine

## 2018-10-11 DIAGNOSIS — F331 Major depressive disorder, recurrent, moderate: Secondary | ICD-10-CM

## 2018-10-11 DIAGNOSIS — F411 Generalized anxiety disorder: Secondary | ICD-10-CM

## 2018-10-11 NOTE — Telephone Encounter (Signed)
Last months RFs on this medication was to go to [facility] not [facility] per pt she has meds at both pharmacy's depending on prices

## 2018-10-13 ENCOUNTER — Other Ambulatory Visit: Payer: Self-pay | Admitting: Family Medicine

## 2018-10-13 DIAGNOSIS — F331 Major depressive disorder, recurrent, moderate: Secondary | ICD-10-CM

## 2018-10-13 DIAGNOSIS — F411 Generalized anxiety disorder: Secondary | ICD-10-CM

## 2018-10-15 NOTE — Telephone Encounter (Signed)
Resending [facility] had computer issues last week

## 2018-10-23 ENCOUNTER — Other Ambulatory Visit: Payer: Self-pay

## 2018-10-23 DIAGNOSIS — F411 Generalized anxiety disorder: Secondary | ICD-10-CM

## 2018-10-23 DIAGNOSIS — F331 Major depressive disorder, recurrent, moderate: Secondary | ICD-10-CM

## 2018-10-23 MED ORDER — ARIPIPRAZOLE 5 MG PO TABS: 5.0000 mg | ORAL_TABLET | Freq: Every day | ORAL | 4 refills | Status: DC

## 2018-11-05 ENCOUNTER — Ambulatory Visit (INDEPENDENT_AMBULATORY_CARE_PROVIDER_SITE_OTHER): Payer: Self-pay | Admitting: Family Medicine

## 2018-11-05 ENCOUNTER — Encounter: Payer: Self-pay | Admitting: Family Medicine

## 2018-11-05 ENCOUNTER — Other Ambulatory Visit: Payer: Self-pay

## 2018-11-05 DIAGNOSIS — F909 Attention-deficit hyperactivity disorder, unspecified type: Secondary | ICD-10-CM

## 2018-11-05 DIAGNOSIS — F411 Generalized anxiety disorder: Secondary | ICD-10-CM

## 2018-11-05 DIAGNOSIS — E039 Hypothyroidism, unspecified: Secondary | ICD-10-CM

## 2018-11-05 DIAGNOSIS — F331 Major depressive disorder, recurrent, moderate: Secondary | ICD-10-CM

## 2018-11-05 MED ORDER — FLUOXETINE HCL 20 MG PO CAPS: 20.0000 mg | ORAL_CAPSULE | Freq: Every day | ORAL | 5 refills | Status: DC

## 2018-11-05 MED ORDER — ARIPIPRAZOLE 5 MG PO TABS: 5.0000 mg | ORAL_TABLET | Freq: Every day | ORAL | 4 refills | Status: DC

## 2018-11-05 MED ORDER — AMPHETAMINE-DEXTROAMPHET ER 30 MG PO CP24: 30.0000 mg | ORAL_CAPSULE | Freq: Every day | ORAL | 0 refills | Status: DC

## 2018-11-05 NOTE — Progress Notes (Signed)
Virtual Visit via telephone Note  I connected with Valerie Ayala on 11/05/18 at 0848 by telephone and verified that I am speaking with the correct person using two identifiers. Valerie AmosKara T Evangelist is currently located at home and no other people are currently with her during visit. The provider, Elige RadonJoshua A Ahrianna Siglin, MD is located in their office at time of visit.  Call ended at [phone]  I discussed the limitations, risks, security and privacy concerns of performing an evaluation and management service by telephone and the availability of in person appointments. I also discussed with the patient that there may be a patient responsible charge related to this service. The patient expressed understanding and agreed to proceed.   History and Present Illness: Anxiety and mood and adhd Patient is coming in for anxiety and mood and ADHD recheck.  She says that she feels like she is doing very well other medication is helping her through the days and her depression is been a lot better and she feels like it is really going well.  She did ask about the short acting amphetamine because of price differential but when discussed with her she is agreeable to stay on the long-acting.  Patient denies any suicidal ideations or thoughts of hurting self and feels like she is doing really well on the Prozac and Abilify.  Hypothyroidism recheck Patient is coming in for thyroid recheck today as well. They deny any issues with hair changes or heat or cold problems or diarrhea or constipation. They deny any chest pain or palpitations. They are currently on levothyroxine 150micrograms   No diagnosis found.  Outpatient Encounter Medications as of 11/05/2018  Medication Sig  . albuterol (PROVENTIL HFA;VENTOLIN HFA) 108 (90 BASE) MCG/ACT inhaler Inhale 2 puffs into the lungs every 6 (six) hours as needed. For shortness of breath  . amphetamine-dextroamphetamine (ADDERALL XR) 30 MG 24 hr capsule Take 1 capsule (30 mg total) by mouth  daily. Do not refill until 30 days from prescription date  . amphetamine-dextroamphetamine (ADDERALL XR) 30 MG 24 hr capsule Take 1 capsule (30 mg total) by mouth daily.  . ARIPiprazole (ABILIFY) 5 MG tablet Take 1 tablet (5 mg total) by mouth daily.  . cholecalciferol (VITAMIN D) 1000 UNITS tablet Take 1,000 Units by mouth daily.  Marland Kitchen. FLUoxetine (PROZAC) 20 MG capsule Take 1 capsule (20 mg total) by mouth daily.  . hydrOXYzine (ATARAX/VISTARIL) 25 MG tablet Take 1 tablet (25 mg total) by mouth 2 (two) times daily as needed.  Marland Kitchen. levothyroxine (SYNTHROID, LEVOTHROID) 150 MCG tablet Take 1 tablet (150 mcg total) by mouth daily.  . norgestimate-ethinyl estradiol (SPRINTEC 28) 0.25-35 MG-MCG tablet Take 1 tablet by mouth daily.   No facility-administered encounter medications on file as of 11/05/2018.     Review of Systems  Constitutional: Negative for chills and fever.  Eyes: Negative for visual disturbance.  Respiratory: Negative for chest tightness and shortness of breath.   Cardiovascular: Negative for chest pain and leg swelling.  Skin: Negative for rash.  Neurological: Negative for light-headedness and headaches.  Psychiatric/Behavioral: Negative for agitation, behavioral problems, decreased concentration, dysphoric mood, self-injury, sleep disturbance and suicidal ideas. The patient is not nervous/anxious.   All other systems reviewed and are negative.   Observations/Objective: Patient sounds comfortable on the phone and in no acute distress  Assessment and Plan: Problem List Items Addressed This Visit      Endocrine   Hypothyroidism - Primary     Other   GAD (generalized  anxiety disorder)   Adult ADHD   Relevant Medications   amphetamine-dextroamphetamine (ADDERALL XR) 30 MG 24 hr capsule   amphetamine-dextroamphetamine (ADDERALL XR) 30 MG 24 hr capsule   amphetamine-dextroamphetamine (ADDERALL XR) 30 MG 24 hr capsule   Major depression, recurrent (HCC)       Follow Up  Instructions: Follow-up in 3 months for ADHD and thyroid recheck and if she has insurance for well check and a physical    I discussed the assessment and treatment plan with the patient. The patient was provided an opportunity to ask questions and all were answered. The patient agreed with the plan and demonstrated an understanding of the instructions.   The patient was advised to call back or seek an in-person evaluation if the symptoms worsen or if the condition fails to improve as anticipated.  The above assessment and management plan was discussed with the patient. The patient verbalized understanding of and has agreed to the management plan. Patient is aware to call the clinic if symptoms persist or worsen. Patient is aware when to return to the clinic for a follow-up visit. Patient educated on when it is appropriate to go to the emergency department.    I provided 20 minutes of non-face-to-face time during this encounter.    Nils Pyle, MD

## 2019-01-10 ENCOUNTER — Ambulatory Visit: Payer: Self-pay | Admitting: Family Medicine

## 2019-01-22 ENCOUNTER — Other Ambulatory Visit: Payer: Self-pay

## 2019-01-23 ENCOUNTER — Encounter: Payer: Self-pay | Admitting: Family Medicine

## 2019-01-23 ENCOUNTER — Ambulatory Visit (INDEPENDENT_AMBULATORY_CARE_PROVIDER_SITE_OTHER): Payer: Medicaid Other | Admitting: Family Medicine

## 2019-01-23 DIAGNOSIS — F909 Attention-deficit hyperactivity disorder, unspecified type: Secondary | ICD-10-CM | POA: Diagnosis not present

## 2019-01-23 DIAGNOSIS — E039 Hypothyroidism, unspecified: Secondary | ICD-10-CM

## 2019-01-23 DIAGNOSIS — F331 Major depressive disorder, recurrent, moderate: Secondary | ICD-10-CM

## 2019-01-23 DIAGNOSIS — F411 Generalized anxiety disorder: Secondary | ICD-10-CM | POA: Diagnosis not present

## 2019-01-23 DIAGNOSIS — Z3009 Encounter for other general counseling and advice on contraception: Secondary | ICD-10-CM

## 2019-01-23 MED ORDER — AMPHETAMINE-DEXTROAMPHET ER 30 MG PO CP24: 30.0000 mg | ORAL_CAPSULE | Freq: Every day | ORAL | 0 refills | Status: DC

## 2019-01-23 MED ORDER — MEDROXYPROGESTERONE ACETATE 150 MG/ML IM SUSP: 150.0000 mg | INTRAMUSCULAR | 4 refills | Status: DC

## 2019-01-23 MED ORDER — ARIPIPRAZOLE 10 MG PO TABS: 10.0000 mg | ORAL_TABLET | Freq: Every day | ORAL | 5 refills | Status: DC

## 2019-01-23 MED ORDER — LEVOTHYROXINE SODIUM 150 MCG PO TABS: 150.0000 ug | ORAL_TABLET | Freq: Every day | ORAL | 1 refills | Status: DC

## 2019-01-23 NOTE — Progress Notes (Signed)
Virtual Visit via telephone Note  I connected with Valerie Ayala on 01/23/19 at 1355 by telephone and verified that I am speaking with the correct person using two identifiers. Valerie Ayala is currently located at home and no other people are currently with her during visit. The provider, Fransisca Kaufmann Dettinger, MD is located in their office at time of visit.  Call ended at 1407  I discussed the limitations, risks, security and privacy concerns of performing an evaluation and management service by telephone and the availability of in person appointments. I also discussed with the patient that there may be a patient responsible charge related to this service. The patient expressed understanding and agreed to proceed.   History and Present Illness: adhd and major depression and anxiety Patient is coming in for recheck on ADHD and anxiety and depression.  She is currently on Adderall 30 and Abilify and Prozac.  She feels like for the most part they are doing very well and her mood is been doing very well even despite everything that is been going on.  Patient denies any major mood swings or issues or any suicidal ideations or thoughts of hurting herself.  Hypothyroidism recheck Patient is coming in for thyroid recheck today as well. They deny any issues with hair changes or heat or cold problems or diarrhea or constipation. They deny any chest pain or palpitations. They are currently on levothyroxine 150 micrograms   Birth control Patient is coming in for birth control, patient is wanting to start birth control and we discussed the multiple methods and she would like to do the Depo-Provera shot, she does not have insurance so she needs a cheap method and this is a good method as she is not good at taking the daily pill.  No diagnosis found.  Outpatient Encounter Medications as of 01/23/2019  Medication Sig  . albuterol (PROVENTIL HFA;VENTOLIN HFA) 108 (90 BASE) MCG/ACT inhaler Inhale 2 puffs into the  lungs every 6 (six) hours as needed. For shortness of breath  . amphetamine-dextroamphetamine (ADDERALL XR) 30 MG 24 hr capsule Take 1 capsule (30 mg total) by mouth daily. Do not refill until 30 days from prescription date  . amphetamine-dextroamphetamine (ADDERALL XR) 30 MG 24 hr capsule Take 1 capsule (30 mg total) by mouth daily.  Marland Kitchen amphetamine-dextroamphetamine (ADDERALL XR) 30 MG 24 hr capsule Take 1 capsule (30 mg total) by mouth daily.  . ARIPiprazole (ABILIFY) 5 MG tablet Take 1 tablet (5 mg total) by mouth daily.  . cholecalciferol (VITAMIN D) 1000 UNITS tablet Take 1,000 Units by mouth daily.  Marland Kitchen FLUoxetine (PROZAC) 20 MG capsule Take 1 capsule (20 mg total) by mouth daily.  . hydrOXYzine (ATARAX/VISTARIL) 25 MG tablet Take 1 tablet (25 mg total) by mouth 2 (two) times daily as needed.  Marland Kitchen levothyroxine (SYNTHROID, LEVOTHROID) 150 MCG tablet Take 1 tablet (150 mcg total) by mouth daily.  . norgestimate-ethinyl estradiol (SPRINTEC 28) 0.25-35 MG-MCG tablet Take 1 tablet by mouth daily.   No facility-administered encounter medications on file as of 01/23/2019.     Review of Systems  Constitutional: Negative for chills and fever.  Eyes: Negative for visual disturbance.  Respiratory: Negative for chest tightness and shortness of breath.   Cardiovascular: Negative for chest pain and leg swelling.  Genitourinary: Negative for difficulty urinating, dysuria, menstrual problem and pelvic pain.  Musculoskeletal: Negative for back pain and gait problem.  Skin: Negative for rash.  Neurological: Negative for dizziness, weakness, light-headedness and headaches.  Psychiatric/Behavioral: Negative for agitation and behavioral problems.  All other systems reviewed and are negative.   Observations/Objective: Patient sounds comfortable and in no acute distress  Assessment and Plan: Problem List Items Addressed This Visit      Endocrine   Hypothyroidism   Relevant Medications   levothyroxine  (SYNTHROID) 150 MCG tablet     Other   GAD (generalized anxiety disorder)   Relevant Medications   ARIPiprazole (ABILIFY) 10 MG tablet   Adult ADHD   Relevant Medications   amphetamine-dextroamphetamine (ADDERALL XR) 30 MG 24 hr capsule   amphetamine-dextroamphetamine (ADDERALL XR) 30 MG 24 hr capsule   amphetamine-dextroamphetamine (ADDERALL XR) 30 MG 24 hr capsule   Major depression, recurrent (HCC) - Primary   Relevant Medications   ARIPiprazole (ABILIFY) 10 MG tablet   Counseling for initiation of birth control method   Relevant Medications   medroxyPROGESTERone (DEPO-PROVERA) 150 MG/ML injection       Follow Up Instructions:  3 month recheck  Continue thyroid and Abilify and Adderall  Will start Depo-Provera   I discussed the assessment and treatment plan with the patient. The patient was provided an opportunity to ask questions and all were answered. The patient agreed with the plan and demonstrated an understanding of the instructions.   The patient was advised to call back or seek an in-person evaluation if the symptoms worsen or if the condition fails to improve as anticipated.  The above assessment and management plan was discussed with the patient. The patient verbalized understanding of and has agreed to the management plan. Patient is aware to call the clinic if symptoms persist or worsen. Patient is aware when to return to the clinic for a follow-up visit. Patient educated on when it is appropriate to go to the emergency department.    I provided 12 minutes of non-face-to-face time during this encounter.    Nils PyleJoshua A Dettinger, MD

## 2019-03-05 ENCOUNTER — Telehealth: Payer: Self-pay | Admitting: Family Medicine

## 2019-03-05 DIAGNOSIS — F909 Attention-deficit hyperactivity disorder, unspecified type: Secondary | ICD-10-CM

## 2019-03-05 NOTE — Telephone Encounter (Signed)
amphetamine-dextroamphetamine (ADDERALL XR) 30 MG 24 hr capsule needs to be sent to [facility] as name brand not generic due to patients medicaid insurance.

## 2019-03-05 NOTE — Telephone Encounter (Signed)
Wants a call back today wants another provider to do it/

## 2019-03-05 NOTE — Telephone Encounter (Signed)
Please address when back in office tomorrow.

## 2019-03-05 NOTE — Telephone Encounter (Signed)
Called and aware this MUST wait for PCP. He will return tomorrow.

## 2019-03-05 NOTE — Telephone Encounter (Signed)
Error - NA and no VM for pt - (not aware )

## 2019-03-06 ENCOUNTER — Telehealth: Payer: Self-pay | Admitting: Family Medicine

## 2019-03-06 ENCOUNTER — Other Ambulatory Visit: Payer: Self-pay | Admitting: Family Medicine

## 2019-03-06 DIAGNOSIS — F909 Attention-deficit hyperactivity disorder, unspecified type: Secondary | ICD-10-CM

## 2019-03-06 MED ORDER — AMPHETAMINE-DEXTROAMPHET ER 30 MG PO CP24: 30.0000 mg | ORAL_CAPSULE | Freq: Every day | ORAL | 0 refills | Status: DC

## 2019-03-06 MED ORDER — ADDERALL XR 30 MG PO CP24: 30.0000 mg | ORAL_CAPSULE | Freq: Every day | ORAL | 0 refills | Status: DC

## 2019-03-06 NOTE — Addendum Note (Signed)
Addended by: Caryl Pina on: 03/06/2019 02:53 PM   Modules accepted: Orders

## 2019-03-06 NOTE — Telephone Encounter (Signed)
I do not understand because I sent 3 months worth 2 months ago, has she gotten the previous 2 months filled or why is she just bringing this up now?

## 2019-03-06 NOTE — Telephone Encounter (Signed)
Patient states that medicaid will not pay for generic and will need name brand sent in

## 2019-03-06 NOTE — Telephone Encounter (Signed)
Patient aware.

## 2019-03-06 NOTE — Progress Notes (Signed)
I sent in the second month, this is why I tried to ask because there was confusion about whether or not she had gotten 1 or 2 months already.

## 2019-03-06 NOTE — Telephone Encounter (Signed)
Please let the patient know that I sent in 1 month, since she has insurance now we should get her scheduled for a Pap 1 month from now because she had an abnormal one in the past.

## 2019-05-02 ENCOUNTER — Encounter: Payer: Self-pay | Admitting: Family Medicine

## 2019-05-09 ENCOUNTER — Encounter: Payer: Self-pay | Admitting: Family Medicine

## 2019-05-09 ENCOUNTER — Ambulatory Visit (INDEPENDENT_AMBULATORY_CARE_PROVIDER_SITE_OTHER): Payer: Medicaid Other | Admitting: Family Medicine

## 2019-05-09 DIAGNOSIS — F411 Generalized anxiety disorder: Secondary | ICD-10-CM | POA: Diagnosis not present

## 2019-05-09 DIAGNOSIS — F331 Major depressive disorder, recurrent, moderate: Secondary | ICD-10-CM | POA: Diagnosis not present

## 2019-05-09 DIAGNOSIS — E039 Hypothyroidism, unspecified: Secondary | ICD-10-CM

## 2019-05-09 DIAGNOSIS — F909 Attention-deficit hyperactivity disorder, unspecified type: Secondary | ICD-10-CM | POA: Diagnosis not present

## 2019-05-09 DIAGNOSIS — Z13220 Encounter for screening for lipoid disorders: Secondary | ICD-10-CM | POA: Diagnosis not present

## 2019-05-09 MED ORDER — FLUOXETINE HCL 20 MG PO CAPS: 20.0000 mg | ORAL_CAPSULE | Freq: Every day | ORAL | 3 refills | Status: DC

## 2019-05-09 MED ORDER — ADDERALL XR 20 MG PO CP24: 40.0000 mg | ORAL_CAPSULE | Freq: Every day | ORAL | 0 refills | Status: DC

## 2019-05-09 MED ORDER — ARIPIPRAZOLE 10 MG PO TABS: 10.0000 mg | ORAL_TABLET | Freq: Every day | ORAL | 3 refills | Status: DC

## 2019-05-09 MED ORDER — LEVOTHYROXINE SODIUM 150 MCG PO TABS: 150.0000 ug | ORAL_TABLET | Freq: Every day | ORAL | 3 refills | Status: DC

## 2019-05-09 NOTE — Progress Notes (Signed)
Virtual Visit via telephone Note  I connected with Valerie Ayala on 05/09/19 at 1517 by telephone and verified that I am speaking with the correct person using two identifiers. Valerie Ayala is currently located at home and no other people are currently with her during visit. The provider, Fransisca Kaufmann Dettinger, MD is located in their office at time of visit.  Call ended at 1535  I discussed the limitations, risks, security and privacy concerns of performing an evaluation and management service by telephone and the availability of in person appointments. I also discussed with the patient that there may be a patient responsible charge related to this service. The patient expressed understanding and agreed to proceed.   History and Present Illness: Hypothyroidism recheck Patient is coming in for thyroid recheck today as well. They deny any issues with hair changes or heat or cold problems or diarrhea or constipation. They deny any chest pain or palpitations. They are currently on levothyroxine 129mcrograms   Anxiety and depression and ADHD Patient is taking prozac and abilify. No major panic attacks, and kids are home with COVID schooling.  Patient denies any suicidal ideations. Current rx-Adderall 40 mg XR daily # meds rx-60 tablets of 20 mg XR Effectiveness of current meds-30 was not working so we increased her to 40 Adverse reactions form meds-none  Pill count performed-No Last drug screen -06/06/2018 ( high risk q346mmoderate risk q6m38mow risk yearly ) Urine drug screen today- No virtual appointment [place]  If yes were their any concerning findings? -None  No flowsheet data found.   Controlled substance contract signed on: 06/06/2018  No diagnosis found.  Outpatient Encounter Medications as of 05/09/2019  Medication Sig  . ADDERALL XR 30 MG 24 hr capsule Take 1 capsule (30 mg total) by mouth daily.  . aMarland Kitchenbuterol (PROVENTIL HFA;VENTOLIN HFA) 108 (90 BASE)  MCG/ACT inhaler Inhale 2 puffs into the lungs every 6 (six) hours as needed. For shortness of breath  . amphetamine-dextroamphetamine (ADDERALL XR) 30 MG 24 hr capsule Take 1 capsule (30 mg total) by mouth daily.  . aMarland Kitchenphetamine-dextroamphetamine (ADDERALL XR) 30 MG 24 hr capsule Take 1 capsule (30 mg total) by mouth daily. Do not refill until 30 days from prescription date  . ARIPiprazole (ABILIFY) 10 MG tablet Take 1 tablet (10 mg total) by mouth daily.  . cholecalciferol (VITAMIN D) 1000 UNITS tablet Take 1,000 Units by mouth daily.  . FMarland KitchenUoxetine (PROZAC) 20 MG capsule Take 1 capsule (20 mg total) by mouth daily.  . hydrOXYzine (ATARAX/VISTARIL) 25 MG tablet Take 1 tablet (25 mg total) by mouth 2 (two) times daily as needed.  . lMarland Kitchenvothyroxine (SYNTHROID) 150 MCG tablet Take 1 tablet (150 mcg total) by mouth daily.  . medroxyPROGESTERone (DEPO-PROVERA) 150 MG/ML injection Inject 1 mL (150 mg total) into the muscle every 3 (three) months.   No facility-administered encounter medications on file as of 05/09/2019.     Review of Systems  Constitutional: Negative for chills and fever.  Eyes: Negative for visual disturbance.  Respiratory: Negative for chest tightness and shortness of breath.   Cardiovascular: Negative for chest pain and leg swelling.  Musculoskeletal: Negative for back pain and gait problem.  Skin: Negative for rash.  Psychiatric/Behavioral: Positive for decreased concentration, dysphoric mood and sleep disturbance. Negative for agitation, behavioral problems, self-injury and suicidal ideas. The patient is nervous/anxious.   All other systems reviewed and are negative.   Observations/Objective: Patient sounds comfortable and in no acute  distress  Assessment and Plan: Problem List Items Addressed This Visit      Endocrine   Hypothyroidism   Relevant Medications   levothyroxine (SYNTHROID) 150 MCG tablet   Other Relevant Orders   TSH     Other   GAD (generalized anxiety  disorder)   Relevant Medications   FLUoxetine (PROZAC) 20 MG capsule   ARIPiprazole (ABILIFY) 10 MG tablet   Other Relevant Orders   CBC with Differential/Platelet   CMP14+EGFR   Adult ADHD   Major depression, recurrent ([place]) - Primary   Relevant Medications   FLUoxetine (PROZAC) 20 MG capsule   ARIPiprazole (ABILIFY) 10 MG tablet    Other Visit Diagnoses    Need for lipid screening       Relevant Orders   Lipid panel       Follow Up Instructions: Follow up in 3 months for ADHD. Continue anxiety and depression medication because she seems to be doing well, will continue thyroid and she will come in and do a thyroid check to see if the doses are correct.    I discussed the assessment and treatment plan with the patient. The patient was provided an opportunity to ask questions and all were answered. The patient agreed with the plan and demonstrated an understanding of the instructions.   The patient was advised to call back or seek an in-person evaluation if the symptoms worsen or if the condition fails to improve as anticipated.  The above assessment and management plan was discussed with the patient. The patient verbalized understanding of and has agreed to the management plan. Patient is aware to call the clinic if symptoms persist or worsen. Patient is aware when to return to the clinic for a follow-up visit. Patient educated on when it is appropriate to go to the emergency department.    I provided 18 minutes of non-face-to-face time during this encounter.    Worthy Rancher, MD

## 2019-05-14 ENCOUNTER — Telehealth: Payer: Self-pay | Admitting: *Deleted

## 2019-05-14 NOTE — Telephone Encounter (Addendum)
Prior Auth for Abilify 10mg -APPROVED through 05/13/20  PA#-[id number]  [facility]  Pharmacy notified

## 2019-07-10 ENCOUNTER — Ambulatory Visit: Payer: Self-pay | Admitting: Family Medicine

## 2019-07-11 ENCOUNTER — Ambulatory Visit (INDEPENDENT_AMBULATORY_CARE_PROVIDER_SITE_OTHER): Payer: Medicaid Other | Admitting: Family Medicine

## 2019-07-11 ENCOUNTER — Encounter: Payer: Self-pay | Admitting: Family Medicine

## 2019-07-11 DIAGNOSIS — F411 Generalized anxiety disorder: Secondary | ICD-10-CM | POA: Diagnosis not present

## 2019-07-11 DIAGNOSIS — F331 Major depressive disorder, recurrent, moderate: Secondary | ICD-10-CM | POA: Diagnosis not present

## 2019-07-11 DIAGNOSIS — Z13220 Encounter for screening for lipoid disorders: Secondary | ICD-10-CM | POA: Diagnosis not present

## 2019-07-11 DIAGNOSIS — E039 Hypothyroidism, unspecified: Secondary | ICD-10-CM

## 2019-07-11 DIAGNOSIS — F909 Attention-deficit hyperactivity disorder, unspecified type: Secondary | ICD-10-CM | POA: Diagnosis not present

## 2019-07-11 MED ORDER — ADDERALL XR 20 MG PO CP24: 40.0000 mg | ORAL_CAPSULE | Freq: Every day | ORAL | 0 refills | Status: DC

## 2019-07-11 NOTE — Progress Notes (Signed)
Virtual Visit via telephone Note  I connected with Valerie Ayala on 07/11/19 at 1519 by telephone and verified that I am speaking with the correct person using two identifiers. Valerie Ayala is currently located at home and no other people are currently with her during visit. The provider, Fransisca Kaufmann Gaylon Melchor, MD is located in their office at time of visit.  Call ended at 1527  I discussed the limitations, risks, security and privacy concerns of performing an evaluation and management service by telephone and the availability of in person appointments. I also discussed with the patient that there may be a patient responsible charge related to this service. The patient expressed understanding and agreed to proceed.   History and Present Illness: Hypothyroidism recheck Patient is coming in for thyroid recheck today as well. They deny any issues with hair changes or heat or cold problems or diarrhea or constipation. They deny any chest pain or palpitations. 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 -None  No flowsheet data found.   Controlled substance contract signed on: 06/09/2018  Depression and anxiety Patient is currently taking fluoxetine and abilify, she feels like things are going very well and her anxiety depression is under control despite everything has been going around.  She is feeling a little under the weather today with some  upset stomach but otherwise feeling pretty good and feels like things are doing very well for her.  Patient denies any suicidal ideations or thoughts of hurting self  1. Adult ADHD   2. GAD (generalized anxiety disorder)   3. Moderate episode of recurrent major depressive disorder ([facility]  4. Acquired hypothyroidism   5. Lipid screening     Outpatient Encounter Medications as of 07/11/2019  Medication Sig  . [START ON 08/07/2019] ADDERALL XR 20 MG 24 hr capsule Take 2 capsules (40 mg total) by mouth daily.  . Derrill MemoN 09/05/2019] ADDERALL XR 20 MG 24 hr capsule Take 2 capsules (40 mg total) by mouth daily.  . Derrill MemoN 10/05/2019] ADDERALL XR 20 MG 24 hr capsule Take 2 capsules (40 mg total) by mouth daily.  . Marland Kitchenlbuterol (PROVENTIL HFA;VENTOLIN HFA) 108 (90 BASE) MCG/ACT inhaler Inhale 2 puffs into the lungs every 6 (six) hours as needed. For shortness of breath  . ARIPiprazole (ABILIFY) 10 MG tablet Take 1 tablet (10 mg total) by mouth daily.  . cholecalciferol (VITAMIN D) 1000 UNITS tablet Take 1,000 Units by mouth daily.  . Marland KitchenLUoxetine (PROZAC) 20 MG capsule Take 1 capsule (20 mg total) by mouth daily.  . Marland Kitchenevothyroxine (SYNTHROID) 150 MCG tablet Take 1 tablet (150 mcg total) by mouth daily.  . medroxyPROGESTERone (DEPO-PROVERA) 150 MG/ML injection Inject 1 mL (150 mg total) into the muscle every 3 (three) months.  . [DISCONTINUED] ADDERALL XR 20 MG 24 hr capsule Take 2 capsules (40 mg total) by mouth daily.  . [DISCONTINUED] ADDERALL XR 20 MG 24 hr  capsule Take 2 capsules (40 mg total) by mouth daily.  . [DISCONTINUED] ADDERALL XR 20 MG 24 hr capsule Take 2 capsules (40 mg total) by mouth daily.  . [DISCONTINUED] hydrOXYzine (ATARAX/VISTARIL) 25 MG tablet Take 1 tablet (25 mg total) by mouth 2 (two) times daily as needed.   No facility-administered encounter medications on file as of 07/11/2019.    Review of Systems  Constitutional: Negative for chills and fever.  Eyes: Negative for visual  disturbance.  Respiratory: Negative for chest tightness and shortness of breath.   Cardiovascular: Negative for chest pain and leg swelling.  Musculoskeletal: Negative for back pain and gait problem.  Skin: Negative for rash.  Neurological: Negative for light-headedness and headaches.  Psychiatric/Behavioral: Negative for agitation and behavioral problems.  All other systems reviewed and are negative.   Observations/Objective: Patient sounds comfortable and in no acute distress  Assessment and Plan: Problem List Items Addressed This Visit      Endocrine   Hypothyroidism   Relevant Orders   TSH     Other   GAD (generalized anxiety disorder)   Relevant Orders   CBC with Differential/Platelet   TSH   Adult ADHD - Primary   Major depression, recurrent ([place])   Relevant Orders   CMP14+EGFR    Other Visit Diagnoses    Lipid screening       Relevant Orders   Lipid panel      Will continue ADHD medication and her antidepressants. Follow up plan: Return in about 4 months (around 11/04/2019), or if symptoms worsen or fail to improve, for ADHD and depression and anxiety.     I discussed the assessment and treatment plan with the patient. The patient was provided an opportunity to ask questions and all were answered. The patient agreed with the plan and demonstrated an understanding of the instructions.   The patient was advised to call back or seek an in-person evaluation if the symptoms worsen or if the condition fails to improve as anticipated.  The above assessment and management plan was discussed with the patient. The patient verbalized understanding of and has agreed to the management plan. Patient is aware to call the clinic if symptoms persist or worsen. Patient is aware when to return to the clinic for a follow-up visit. Patient educated on when it is appropriate to go to the emergency department.    I provided 8 minutes of non-face-to-face time during this  encounter.    Worthy Rancher, MD

## 2019-09-25 ENCOUNTER — Encounter: Payer: Self-pay | Admitting: Family Medicine

## 2019-09-25 ENCOUNTER — Telehealth (INDEPENDENT_AMBULATORY_CARE_PROVIDER_SITE_OTHER): Payer: Medicaid Other | Admitting: Family Medicine

## 2019-09-25 DIAGNOSIS — E039 Hypothyroidism, unspecified: Secondary | ICD-10-CM | POA: Diagnosis not present

## 2019-09-25 DIAGNOSIS — F411 Generalized anxiety disorder: Secondary | ICD-10-CM | POA: Diagnosis not present

## 2019-09-25 DIAGNOSIS — F909 Attention-deficit hyperactivity disorder, unspecified type: Secondary | ICD-10-CM | POA: Diagnosis not present

## 2019-09-25 DIAGNOSIS — Z13220 Encounter for screening for lipoid disorders: Secondary | ICD-10-CM | POA: Diagnosis not present

## 2019-09-25 DIAGNOSIS — Z3009 Encounter for other general counseling and advice on contraception: Secondary | ICD-10-CM

## 2019-09-25 MED ORDER — ADDERALL XR 20 MG PO CP24: 40.0000 mg | ORAL_CAPSULE | Freq: Every day | ORAL | 0 refills | Status: DC

## 2019-09-25 NOTE — Progress Notes (Signed)
 Virtual Visit via mychart video Note  I connected with Valerie Ayala on 09/25/19 at 1306 by video and verified that I am speaking with the correct person using two identifiers. Valerie Ayala is currently located at home and 2 children are currently with her during visit. The provider,  A , MD is located in their office at time of visit.  Call ended at 1318  I discussed the limitations, risks, security and privacy concerns of performing an evaluation and management service by video and the availability of in person appointments. I also discussed with the patient that there may be a patient responsible charge related to this service. The patient expressed understanding and agreed to proceed.   History and Present Illness: Hypothyroidism recheck Patient is coming in for thyroid recheck today as well. They deny any issues with hair changes or heat or cold problems or diarrhea or constipation. They deny any chest pain or palpitations. They are currently on levothyroxine 150micrograms   Anxiety and ADHD Patient currently is taking Prozac and adderall and Abilify and feels like things going. She denies any anxiety issue. She is doing well on adderall and is controlled on medications.   No diagnosis found.  Outpatient Encounter Medications as of 09/25/2019  Medication Sig  . ADDERALL XR 20 MG 24 hr capsule Take 2 capsules (40 mg total) by mouth daily.  . ADDERALL XR 20 MG 24 hr capsule Take 2 capsules (40 mg total) by mouth daily.  . [START ON 10/05/2019] ADDERALL XR 20 MG 24 hr capsule Take 2 capsules (40 mg total) by mouth daily.  . albuterol (PROVENTIL HFA;VENTOLIN HFA) 108 (90 BASE) MCG/ACT inhaler Inhale 2 puffs into the lungs every 6 (six) hours as needed. For shortness of breath  . ARIPiprazole (ABILIFY) 10 MG tablet Take 1 tablet (10 mg total) by mouth daily.  . cholecalciferol (VITAMIN D) 1000 UNITS tablet Take 1,000 Units by mouth daily.  . FLUoxetine (PROZAC) 20 MG capsule  Take 1 capsule (20 mg total) by mouth daily.  . levothyroxine (SYNTHROID) 150 MCG tablet Take 1 tablet (150 mcg total) by mouth daily.  . medroxyPROGESTERone (DEPO-PROVERA) 150 MG/ML injection Inject 1 mL (150 mg total) into the muscle every 3 (three) months.   No facility-administered encounter medications on file as of 09/25/2019.    Review of Systems  Constitutional: Negative for chills and fever.  Eyes: Negative for redness and visual disturbance.  Respiratory: Negative for chest tightness and shortness of breath.   Cardiovascular: Negative for chest pain and leg swelling.  Genitourinary: Negative for difficulty urinating and dysuria.  Musculoskeletal: Negative for back pain and gait problem.  Skin: Negative for rash.  Neurological: Negative for light-headedness and headaches.  Psychiatric/Behavioral: Positive for dysphoric mood. Negative for agitation, behavioral problems, self-injury, sleep disturbance and suicidal ideas. The patient is nervous/anxious.   All other systems reviewed and are negative.   Observations/Objective: Patient sounds and looks comfortable  Assessment and Plan: Problem List Items Addressed This Visit      Endocrine   Hypothyroidism - Primary   Relevant Orders   TSH     Other   GAD (generalized anxiety disorder)   Relevant Orders   CBC with Differential/Platelet   CMP14+EGFR   Adult ADHD   Relevant Medications   ADDERALL XR 20 MG 24 hr capsule (Start on 11/03/2019)   ADDERALL XR 20 MG 24 hr capsule (Start on 11/03/2019)   ADDERALL XR 20 MG 24 hr capsule (Start on 10/05/2019)     Other Relevant Orders   ToxASSURE Select 2 (MW), Urine   Counseling for initiation of birth control method   Relevant Orders   Pregnancy, urine    Other Visit Diagnoses    Lipid screening       Relevant Orders   Lipid panel       Follow up plan: Return in about 3 months (around 12/25/2019), or if symptoms worsen or fail to improve, for adhd and anxiety.     I  discussed the assessment and treatment plan with the patient. The patient was provided an opportunity to ask questions and all were answered. The patient agreed with the plan and demonstrated an understanding of the instructions.   The patient was advised to call back or seek an in-person evaluation if the symptoms worsen or if the condition fails to improve as anticipated.  The above assessment and management plan was discussed with the patient. The patient verbalized understanding of and has agreed to the management plan. Patient is aware to call the clinic if symptoms persist or worsen. Patient is aware when to return to the clinic for a follow-up visit. Patient educated on when it is appropriate to go to the emergency department.    I provided 12 minutes of non-face-to-face time during this encounter.    Worthy Rancher, MD

## 2020-01-15 ENCOUNTER — Telehealth: Payer: Self-pay | Admitting: Family Medicine

## 2020-01-15 NOTE — Telephone Encounter (Signed)
Appt made for medication refill.  Advised patient that she will need to start scheduling med refill appointments before leaving the office.

## 2020-01-29 ENCOUNTER — Ambulatory Visit: Payer: Medicaid Other | Admitting: Family Medicine

## 2020-02-27 ENCOUNTER — Encounter: Payer: Self-pay | Admitting: Family Medicine

## 2020-02-27 ENCOUNTER — Ambulatory Visit (INDEPENDENT_AMBULATORY_CARE_PROVIDER_SITE_OTHER): Payer: Medicaid Other | Admitting: Family Medicine

## 2020-02-27 ENCOUNTER — Other Ambulatory Visit: Payer: Self-pay

## 2020-02-27 VITALS — BP 136/88 | HR 98 | Temp 98.2°F | Ht 67.0 in | Wt 156.2 lb

## 2020-02-27 DIAGNOSIS — F909 Attention-deficit hyperactivity disorder, unspecified type: Secondary | ICD-10-CM | POA: Diagnosis not present

## 2020-02-27 DIAGNOSIS — Z3009 Encounter for other general counseling and advice on contraception: Secondary | ICD-10-CM | POA: Diagnosis not present

## 2020-02-27 DIAGNOSIS — E039 Hypothyroidism, unspecified: Secondary | ICD-10-CM | POA: Diagnosis not present

## 2020-02-27 DIAGNOSIS — Z13220 Encounter for screening for lipoid disorders: Secondary | ICD-10-CM | POA: Diagnosis not present

## 2020-02-27 DIAGNOSIS — F411 Generalized anxiety disorder: Secondary | ICD-10-CM

## 2020-02-27 DIAGNOSIS — F331 Major depressive disorder, recurrent, moderate: Secondary | ICD-10-CM | POA: Diagnosis not present

## 2020-02-27 MED ORDER — FLUOXETINE HCL 20 MG PO CAPS: 20.0000 mg | ORAL_CAPSULE | Freq: Every day | ORAL | 3 refills | Status: DC

## 2020-02-27 MED ORDER — LISDEXAMFETAMINE DIMESYLATE 40 MG PO CAPS: 40.0000 mg | ORAL_CAPSULE | ORAL | 0 refills | Status: DC

## 2020-02-27 MED ORDER — MEDROXYPROGESTERONE ACETATE 150 MG/ML IM SUSP: 150.0000 mg | INTRAMUSCULAR | 4 refills | Status: DC

## 2020-02-27 MED ORDER — ARIPIPRAZOLE 10 MG PO TABS: 10.0000 mg | ORAL_TABLET | Freq: Every day | ORAL | 3 refills | Status: DC

## 2020-02-27 NOTE — Progress Notes (Signed)
BP 136/88   Pulse 98   Temp 98.2 F (36.8 C) (Temporal)   Ht 5' 7"  (1.702 m)   Wt 156 lb 3.2 oz (70.9 kg)   BMI 24.46 kg/m    Subjective:   Patient ID: Valerie Ayala, female    DOB: 11-Jul-1985, 34 y.o.   MRN: [id number]  HPI: Valerie Ayala is a 34 y.o. female presenting on 02/27/2020 for Medication Refill   HPI adhd  Current rx- vyvanse 40 mg daily # meds rx- 30 Effectiveness of current meds-has been off Adverse reactions form meds-none  Pill count performed-No Last drug screen - 05/2018 ( high risk q67m moderate risk q641mlow risk yearly ) Urine drug screen today- Yes [place]  If yes were their any concerning findings? - none  No flowsheet data found.   Controlled substance contract signed on: today   Hypothyroidism recheck Patient is coming in for thyroid recheck today as well. They deny any issues with hair changes or heat or cold problems or diarrhea or constipation. They deny any chest pain or palpitations. They are currently on levothyroxine 15031mograms   Anxiety and depression Patient is coming in for anxiety depression recheck.  She is doing very well on her medication.  Patient is currently taking Prozac and Abilify and says that they are both working very well and she is very happy and doing very well with her current situation.  Patient denies any suicidal ideation or thoughts of hurting self.  Relevant past medical, surgical, family and social history reviewed and updated as indicated. Interim medical history since our last visit reviewed. Allergies and medications reviewed and updated.  Review of Systems  Constitutional: Negative for chills and fever.  HENT: Negative for congestion, ear discharge and ear pain.   Eyes: Negative for redness and visual disturbance.  Respiratory: Negative for chest tightness and shortness of breath.   Cardiovascular: Negative for chest pain and leg swelling.  Genitourinary: Negative for difficulty  urinating and dysuria.  Musculoskeletal: Negative for back pain and gait problem.  Skin: Negative for rash.  Neurological: Negative for light-headedness and headaches.  Psychiatric/Behavioral: Positive for decreased concentration. Negative for agitation, behavioral problems, self-injury, sleep disturbance and suicidal ideas. The patient is nervous/anxious.   All other systems reviewed and are negative.   Per HPI unless specifically indicated above   Allergies as of 02/27/2020   No Known Allergies     Medication List       Accurate as of February 27, 2020  1:14 PM. If you have any questions, ask your nurse or doctor.        Adderall XR 20 MG 24 hr capsule Generic drug: amphetamine-dextroamphetamine Take 2 capsules (40 mg total) by mouth daily.   Adderall XR 20 MG 24 hr capsule Generic drug: amphetamine-dextroamphetamine Take 2 capsules (40 mg total) by mouth daily.   Adderall XR 20 MG 24 hr capsule Generic drug: amphetamine-dextroamphetamine Take 2 capsules (40 mg total) by mouth daily.   albuterol 108 (90 Base) MCG/ACT inhaler Commonly known as: VENTOLIN HFA Inhale 2 puffs into the lungs every 6 (six) hours as needed. For shortness of breath   ARIPiprazole 10 MG tablet Commonly known as: ABILIFY Take 1 tablet (10 mg total) by mouth daily.   cholecalciferol 1000 units tablet Commonly known as: VITAMIN D Take 1,000 Units by mouth daily.   FLUoxetine 20 MG capsule Commonly known as: PROzac Take 1 capsule (20 mg total) by mouth daily.  levothyroxine 150 MCG tablet Commonly known as: SYNTHROID Take 1 tablet (150 mcg total) by mouth daily.   medroxyPROGESTERone 150 MG/ML injection Commonly known as: Depo-Provera Inject 1 mL (150 mg total) into the muscle every 3 (three) months.        Objective:   BP 136/88   Pulse 98   Temp 98.2 F (36.8 C) (Temporal)   Ht 5' 7"  (1.702 m)   Wt 156 lb 3.2 oz (70.9 kg)   BMI 24.46 kg/m   Wt Readings from Last 3  Encounters:  02/27/20 156 lb 3.2 oz (70.9 kg)  09/10/18 143 lb 3.2 oz (65 kg)  08/08/18 141 lb 6.4 oz (64.1 kg)    Physical Exam Vitals and nursing note reviewed.  Constitutional:      General: She is not in acute distress.    Appearance: She is well-developed. She is not diaphoretic.  Eyes:     Conjunctiva/sclera: Conjunctivae normal.  Cardiovascular:     Rate and Rhythm: Normal rate and regular rhythm.     Heart sounds: Normal heart sounds. No murmur heard.   Pulmonary:     Effort: Pulmonary effort is normal. No respiratory distress.     Breath sounds: Normal breath sounds. No wheezing.  Musculoskeletal:        General: No tenderness. Normal range of motion.  Skin:    General: Skin is warm and dry.     Findings: No rash.  Neurological:     Mental Status: She is alert and oriented to person, place, and time.     Coordination: Coordination normal.  Psychiatric:        Behavior: Behavior normal.       Assessment & Plan:   Problem List Items Addressed This Visit      Endocrine   Hypothyroidism - Primary   Relevant Orders   CMP14+EGFR   TSH   Vitamin B12     Other   GAD (generalized anxiety disorder)   Relevant Medications   ARIPiprazole (ABILIFY) 10 MG tablet   FLUoxetine (PROZAC) 20 MG capsule   Adult ADHD   Relevant Medications   lisdexamfetamine (VYVANSE) 40 MG capsule   lisdexamfetamine (VYVANSE) 40 MG capsule (Start on 03/27/2020)   lisdexamfetamine (VYVANSE) 40 MG capsule (Start on 04/26/2020)   Other Relevant Orders   CBC with Differential/Platelet   Vitamin B12   ToxASSURE Select 13 (MW), Urine   Major depression, recurrent (HCC)   Relevant Medications   ARIPiprazole (ABILIFY) 10 MG tablet   FLUoxetine (PROZAC) 20 MG capsule   Other Relevant Orders   Vitamin B12   Counseling for initiation of birth control method   Relevant Medications   medroxyPROGESTERone (DEPO-PROVERA) 150 MG/ML injection    Other Visit Diagnoses    Lipid screening        Relevant Orders   Lipid panel      Continue current medication, will switch from Adderall to Vyvanse, switch to Adderall because of insurance but will go back to Vyvanse now because she did better on it. Follow up plan: Return if symptoms worsen or fail to improve, for wwe and pap asap.  Counseling provided for all of the vaccine components No orders of the defined types were placed in this encounter.   Caryl Pina, MD [facility] 02/27/2020, 1:14 PM

## 2020-02-28 LAB — CBC WITH DIFFERENTIAL/PLATELET
Basophils Absolute: 0.1 10*3/uL (ref 0.0–0.2)
Basos: 2 %
EOS (ABSOLUTE): 0.2 10*3/uL (ref 0.0–0.4)
Eos: 3 %
Hematocrit: 39 % (ref 34.0–46.6)
Hemoglobin: 13.5 g/dL (ref 11.1–15.9)
Immature Grans (Abs): 0 10*3/uL (ref 0.0–0.1)
Immature Granulocytes: 0 %
Lymphocytes Absolute: 3.3 10*3/uL — ABNORMAL HIGH (ref 0.7–3.1)
Lymphs: 45 %
MCH: 33 pg (ref 26.6–33.0)
MCHC: 34.6 g/dL (ref 31.5–35.7)
MCV: 95 fL (ref 79–97)
Monocytes Absolute: 0.4 10*3/uL (ref 0.1–0.9)
Monocytes: 5 %
Neutrophils Absolute: 3.2 10*3/uL (ref 1.4–7.0)
Neutrophils: 45 %
Platelets: 330 10*3/uL (ref 150–450)
RBC: 4.09 x10E6/uL (ref 3.77–5.28)
RDW: 13.4 % (ref 11.7–15.4)
WBC: 7.2 10*3/uL (ref 3.4–10.8)

## 2020-02-28 LAB — LIPID PANEL
Chol/HDL Ratio: 5.1 ratio — ABNORMAL HIGH (ref 0.0–4.4)
Cholesterol, Total: 267 mg/dL — ABNORMAL HIGH (ref 100–199)
HDL: 52 mg/dL (ref >39)
LDL Chol Calc (NIH): 182 mg/dL — ABNORMAL HIGH (ref 0–99)
Triglycerides: 178 mg/dL — ABNORMAL HIGH (ref 0–149)
VLDL Cholesterol Cal: 33 mg/dL (ref 5–40)

## 2020-02-28 LAB — CMP14+EGFR
ALT: 47 IU/L — ABNORMAL HIGH (ref 0–32)
AST: 37 IU/L (ref 0–40)
Albumin/Globulin Ratio: 1.7 (ref 1.2–2.2)
Albumin: 4.8 g/dL (ref 3.8–4.8)
Alkaline Phosphatase: 54 IU/L (ref 48–121)
BUN/Creatinine Ratio: 10 (ref 9–23)
BUN: 10 mg/dL (ref 6–20)
Bilirubin Total: 0.5 mg/dL (ref 0.0–1.2)
CO2: 24 mmol/L (ref 20–29)
Calcium: 8.5 mg/dL — ABNORMAL LOW (ref 8.7–10.2)
Chloride: 97 mmol/L (ref 96–106)
Creatinine, Ser: 1.05 mg/dL — ABNORMAL HIGH (ref 0.57–1.00)
GFR calc Af Amer: 80 mL/min/{1.73_m2} (ref >59)
GFR calc non Af Amer: 69 mL/min/{1.73_m2} (ref >59)
Globulin, Total: 2.8 g/dL (ref 1.5–4.5)
Glucose: 80 mg/dL (ref 65–99)
Potassium: 4 mmol/L (ref 3.5–5.2)
Sodium: 140 mmol/L (ref 134–144)
Total Protein: 7.6 g/dL (ref 6.0–8.5)

## 2020-02-28 LAB — VITAMIN B12: Vitamin B-12: 519 pg/mL (ref 232–1245)

## 2020-02-28 LAB — TSH: TSH: 237 u[IU]/mL — ABNORMAL HIGH (ref 0.450–4.500)

## 2020-02-29 LAB — TOXASSURE SELECT 13 (MW), URINE

## 2020-03-04 ENCOUNTER — Other Ambulatory Visit: Payer: Self-pay

## 2020-03-04 DIAGNOSIS — E039 Hypothyroidism, unspecified: Secondary | ICD-10-CM

## 2020-03-04 DIAGNOSIS — E7800 Pure hypercholesterolemia, unspecified: Secondary | ICD-10-CM

## 2020-03-04 MED ORDER — ATORVASTATIN CALCIUM 20 MG PO TABS: 20.0000 mg | ORAL_TABLET | Freq: Every evening | ORAL | 1 refills | Status: DC

## 2020-03-16 ENCOUNTER — Encounter: Payer: Medicaid Other | Admitting: Family Medicine

## 2020-03-17 ENCOUNTER — Encounter: Payer: Self-pay | Admitting: Family Medicine

## 2020-05-25 ENCOUNTER — Encounter: Payer: Self-pay | Admitting: Family Medicine

## 2020-05-25 ENCOUNTER — Other Ambulatory Visit: Payer: Self-pay

## 2020-05-25 ENCOUNTER — Ambulatory Visit (INDEPENDENT_AMBULATORY_CARE_PROVIDER_SITE_OTHER): Payer: Medicaid Other | Admitting: Family Medicine

## 2020-05-25 ENCOUNTER — Telehealth: Payer: Self-pay | Admitting: Family Medicine

## 2020-05-25 VITALS — BP 122/90 | HR 70 | Temp 97.0°F | Ht 67.0 in | Wt 151.0 lb

## 2020-05-25 DIAGNOSIS — E7800 Pure hypercholesterolemia, unspecified: Secondary | ICD-10-CM

## 2020-05-25 DIAGNOSIS — E039 Hypothyroidism, unspecified: Secondary | ICD-10-CM | POA: Diagnosis not present

## 2020-05-25 DIAGNOSIS — F331 Major depressive disorder, recurrent, moderate: Secondary | ICD-10-CM

## 2020-05-25 DIAGNOSIS — M2550 Pain in unspecified joint: Secondary | ICD-10-CM

## 2020-05-25 DIAGNOSIS — F909 Attention-deficit hyperactivity disorder, unspecified type: Secondary | ICD-10-CM

## 2020-05-25 DIAGNOSIS — F411 Generalized anxiety disorder: Secondary | ICD-10-CM | POA: Diagnosis not present

## 2020-05-25 DIAGNOSIS — Z23 Encounter for immunization: Secondary | ICD-10-CM

## 2020-05-25 MED ORDER — ARIPIPRAZOLE 10 MG PO TABS: 10.0000 mg | ORAL_TABLET | Freq: Every day | ORAL | 3 refills | Status: DC

## 2020-05-25 MED ORDER — LISDEXAMFETAMINE DIMESYLATE 40 MG PO CAPS: 40.0000 mg | ORAL_CAPSULE | ORAL | 0 refills | Status: DC

## 2020-05-25 MED ORDER — ATORVASTATIN CALCIUM 20 MG PO TABS: 20.0000 mg | ORAL_TABLET | Freq: Every evening | ORAL | 3 refills | Status: DC

## 2020-05-25 MED ORDER — FLUOXETINE HCL 20 MG PO CAPS: 20.0000 mg | ORAL_CAPSULE | Freq: Every day | ORAL | 3 refills | Status: DC

## 2020-05-25 MED ORDER — LEVOTHYROXINE SODIUM 150 MCG PO TABS: 150.0000 ug | ORAL_TABLET | Freq: Every day | ORAL | 3 refills | Status: DC

## 2020-05-25 NOTE — Progress Notes (Signed)
BP 122/90   Pulse 70   Temp (!) 97 F (36.1 C)   Ht 5\' 7"  (1.702 m)   Wt 151 lb (68.5 kg)   SpO2 100%   BMI 23.65 kg/m    Subjective:   Patient ID: , female    DOB: Jun 06, 1986, 34 y.o.   MRN: [id number]  HPI: Valerie Ayala is a 34 y.o. female presenting on 05/25/2020 for Medical Management of Chronic Issues, Hypothyroidism, and Depression   HPI Hyperlipidemia Patient is coming in for recheck of his hyperlipidemia. The patient is currently taking atorvastatin. They deny any issues with myalgias or history of liver damage from it. They deny any focal numbness or weakness or chest pain.   ADHD and depression recheck Patient currently takes Abilify and Prozac and Vyvanse and seems to be doing well on it.  She denies any major issues with it.  Hypothyroidism recheck Patient is coming in for thyroid recheck today as well. They deny any issues with hair changes or heat or cold problems or diarrhea or constipation. They deny any chest pain or palpitations. They are currently on levothyroxine 150 micrograms   Relevant past medical, surgical, family and social history reviewed and updated as indicated. Interim medical history since our last visit reviewed. Allergies and medications reviewed and updated.  Review of Systems  Constitutional: Negative for chills and fever.  Eyes: Negative for redness and visual disturbance.  Respiratory: Negative for chest tightness and shortness of breath.   Cardiovascular: Negative for chest pain and leg swelling.  Genitourinary: Negative for difficulty urinating and dysuria.  Musculoskeletal: Positive for arthralgias. Negative for back pain and gait problem.  Skin: Negative for rash.  Neurological: Negative for light-headedness and headaches.  Psychiatric/Behavioral: Positive for decreased concentration. Negative for agitation, behavioral problems, dysphoric mood, self-injury, sleep disturbance and suicidal ideas. The patient is  nervous/anxious.   All other systems reviewed and are negative.   Per HPI unless specifically indicated above   Allergies as of 05/25/2020   No Known Allergies     Medication List       Accurate as of May 25, 2020  1:41 PM. If you have any questions, ask your nurse or doctor.        albuterol 108 (90 Base) MCG/ACT inhaler Commonly known as: VENTOLIN HFA Inhale 2 puffs into the lungs every 6 (six) hours as needed. For shortness of breath   ARIPiprazole 10 MG tablet Commonly known as: ABILIFY Take 1 tablet (10 mg total) by mouth daily.   atorvastatin 20 MG tablet Commonly known as: LIPITOR Take 1 tablet (20 mg total) by mouth at bedtime.   cholecalciferol 1000 units tablet Commonly known as: VITAMIN D Take 1,000 Units by mouth daily.   FLUoxetine 20 MG capsule Commonly known as: PROzac Take 1 capsule (20 mg total) by mouth daily.   levothyroxine 150 MCG tablet Commonly known as: SYNTHROID Take 1 tablet (150 mcg total) by mouth daily.   lisdexamfetamine 40 MG capsule Commonly known as: Vyvanse Take 1 capsule (40 mg total) by mouth every morning. Start taking on: May 31, 2020 What changed: These instructions start on May 31, 2020. If you are unsure what to do until then, ask your doctor or other care provider. Changed by: June 02, 2020 Elexus Barman, MD   lisdexamfetamine 40 MG capsule Commonly known as: Vyvanse Take 1 capsule (40 mg total) by mouth every morning. Start taking on: July 01, 2020 What changed: These instructions start on July 01, 2020. If you are unsure what to do until then, ask your doctor or other care provider. Changed by: Elige Radon Kiwana Deblasi, MD   lisdexamfetamine 40 MG capsule Commonly known as: Vyvanse Take 1 capsule (40 mg total) by mouth every morning. Start taking on: July 31, 2020 What changed: These instructions start on July 31, 2020. If you are unsure what to do until then, ask your doctor or other care  provider. Changed by: Nils Pyle, MD   medroxyPROGESTERone 150 MG/ML injection Commonly known as: Depo-Provera Inject 1 mL (150 mg total) into the muscle every 3 (three) months.        Objective:   BP 122/90   Pulse 70   Temp (!) 97 F (36.1 C)   Ht 5\' 7"  (1.702 m)   Wt 151 lb (68.5 kg)   SpO2 100%   BMI 23.65 kg/m   Wt Readings from Last 3 Encounters:  05/25/20 151 lb (68.5 kg)  02/27/20 156 lb 3.2 oz (70.9 kg)  09/10/18 143 lb 3.2 oz (65 kg)    Physical Exam Vitals and nursing note reviewed.  Constitutional:      General: She is not in acute distress.    Appearance: She is well-developed. She is not diaphoretic.  Eyes:     Conjunctiva/sclera: Conjunctivae normal.  Cardiovascular:     Rate and Rhythm: Normal rate and regular rhythm.     Heart sounds: Normal heart sounds. No murmur heard.   Pulmonary:     Effort: Pulmonary effort is normal. No respiratory distress.     Breath sounds: Normal breath sounds. No wheezing.  Musculoskeletal:        General: No tenderness. Normal range of motion.  Skin:    General: Skin is warm and dry.     Findings: No rash.  Neurological:     Mental Status: She is alert and oriented to person, place, and time.     Coordination: Coordination normal.  Psychiatric:        Behavior: Behavior normal.       Assessment & Plan:   Problem List Items Addressed This Visit      Endocrine   Hypothyroidism - Primary   Relevant Medications   levothyroxine (SYNTHROID) 150 MCG tablet   Other Relevant Orders   TSH     Other   GAD (generalized anxiety disorder)   Relevant Medications   ARIPiprazole (ABILIFY) 10 MG tablet   FLUoxetine (PROZAC) 20 MG capsule   Adult ADHD   Relevant Medications   lisdexamfetamine (VYVANSE) 40 MG capsule (Start on 07/31/2020)   lisdexamfetamine (VYVANSE) 40 MG capsule (Start on 05/31/2020)   lisdexamfetamine (VYVANSE) 40 MG capsule (Start on 07/01/2020)   Major depression, recurrent (HCC)    Relevant Medications   ARIPiprazole (ABILIFY) 10 MG tablet   FLUoxetine (PROZAC) 20 MG capsule    Other Visit Diagnoses    Elevated low density lipoprotein (LDL) cholesterol level       Relevant Medications   atorvastatin (LIPITOR) 20 MG tablet   Other Relevant Orders   Lipid panel   Polyarthralgia       Relevant Orders   Arthritis Panel   ANA w/Reflex      Continue current medication, she is complaining of polyarthralgia in her hands especially especially in the morning and wants to be tested for rheumatoid. Follow up plan: Return in about 3 months (around 08/24/2020), or if symptoms worsen or fail to improve, for ADHD and thyroid recheck.  Counseling provided  for all of the vaccine components Orders Placed This Encounter  Procedures  . TSH  . Lipid panel  . Arthritis Panel  . ANA w/Reflex    Arville Care, MD [facility] 05/25/2020, 1:41 PM

## 2020-05-25 NOTE — Telephone Encounter (Signed)
TC from April w/ [facility] Pt's Abilify Rx needs safety documentation, this is needed sometimes with psych meds Call [phone] was on their response form

## 2020-05-26 LAB — ARTHRITIS PANEL
Basophils Absolute: 0.1 10*3/uL (ref 0.0–0.2)
Basos: 1 %
EOS (ABSOLUTE): 0.2 10*3/uL (ref 0.0–0.4)
Eos: 2 %
Hematocrit: 42.4 % (ref 34.0–46.6)
Hemoglobin: 14.5 g/dL (ref 11.1–15.9)
Immature Grans (Abs): 0 10*3/uL (ref 0.0–0.1)
Immature Granulocytes: 0 %
Lymphocytes Absolute: 4 10*3/uL — ABNORMAL HIGH (ref 0.7–3.1)
Lymphs: 28 %
MCH: 32.2 pg (ref 26.6–33.0)
MCHC: 34.2 g/dL (ref 31.5–35.7)
MCV: 94 fL (ref 79–97)
Monocytes Absolute: 0.6 10*3/uL (ref 0.1–0.9)
Monocytes: 5 %
Neutrophils Absolute: 9.2 10*3/uL — ABNORMAL HIGH (ref 1.4–7.0)
Neutrophils: 64 %
Platelets: 293 10*3/uL (ref 150–450)
RBC: 4.5 x10E6/uL (ref 3.77–5.28)
RDW: 13.6 % (ref 11.7–15.4)
Rheumatoid fact SerPl-aCnc: <10 IU/mL (ref <14.0)
Sed Rate: 3 mm/hr (ref 0–32)
Uric Acid: 3.8 mg/dL (ref 2.6–6.2)
WBC: 14.2 10*3/uL — ABNORMAL HIGH (ref 3.4–10.8)

## 2020-05-26 LAB — ENA+DNA/DS+SJORGEN'S
ENA RNP Ab: 0.3 AI (ref 0.0–0.9)
ENA SM Ab Ser-aCnc: <0.2 AI (ref 0.0–0.9)
ENA SSA (RO) Ab: <0.2 AI (ref 0.0–0.9)
ENA SSB (LA) Ab: <0.2 AI (ref 0.0–0.9)
dsDNA Ab: 55 IU/mL — ABNORMAL HIGH (ref 0–9)

## 2020-05-26 LAB — LIPID PANEL
Chol/HDL Ratio: 5.8 ratio — ABNORMAL HIGH (ref 0.0–4.4)
Cholesterol, Total: 296 mg/dL — ABNORMAL HIGH (ref 100–199)
HDL: 51 mg/dL (ref >39)
LDL Chol Calc (NIH): 204 mg/dL — ABNORMAL HIGH (ref 0–99)
Triglycerides: 214 mg/dL — ABNORMAL HIGH (ref 0–149)
VLDL Cholesterol Cal: 41 mg/dL — ABNORMAL HIGH (ref 5–40)

## 2020-05-26 LAB — TSH: TSH: 225 u[IU]/mL — ABNORMAL HIGH (ref 0.450–4.500)

## 2020-05-26 LAB — ANA W/REFLEX: Anti Nuclear Antibody (ANA): POSITIVE — AB

## 2020-05-26 NOTE — Telephone Encounter (Signed)
PA for abilify completed. d PA Case: [id number], Status: Approved, Coverage Starts on: 05/26/2020 12:00:00 AM, Coverage Ends on: 05/26/2021 12:00:00 AM.   Pharmacy aware.

## 2020-06-05 ENCOUNTER — Encounter: Payer: Self-pay | Admitting: *Deleted

## 2020-07-27 ENCOUNTER — Encounter: Payer: Self-pay | Admitting: Family Medicine

## 2020-08-06 ENCOUNTER — Other Ambulatory Visit: Payer: Self-pay | Admitting: Family Medicine

## 2020-08-06 DIAGNOSIS — R768 Other specified abnormal immunological findings in serum: Secondary | ICD-10-CM

## 2020-08-06 NOTE — Progress Notes (Signed)
Placed referral to rheumatology

## 2020-08-20 NOTE — Progress Notes (Signed)
Office Visit Note  Patient: Valerie Ayala             Date of Birth: 01/19/1986           MRN: [id number]             PCP: Dettinger, Fransisca Kaufmann, MD Referring: Dettinger, Fransisca Kaufmann, MD Visit Date: 08/21/2020   Subjective:  New Patient (Initial Visit) (Abnormal labs, bil hand pain, bil knee pain, bil great toe pain, right shoulder pain)   History of Present Illness: Valerie Ayala is a 35 y.o. female here for evaluation and management of positive ANA with multiple systemic symptoms including arthralgias, fatigue, rashes, lymphadenopathy, and finger numbness and discoloration.  The symptoms were noticeable and somewhat worsening for about the past year.  She did have significant symptoms from her severe postoperative hypothyroidism with a lapse in medication but has gone back on this and states it is back to normal function.  Fatigue is become very severe she sometimes feels like spending the entire day lying in bed with no energy despite getting a normal amount of sleep receiving.  Joint pain is pretty diffuse, lasting for hours or all day. She denies any known graves ophthalmopathy type history. No known blood clots.  Labs reviewed 04/2020 ANA pos dsDNA 55 RNP, SM, SSA, SSB neg WBCs 14.2 TSH 225   Activities of Daily Living:  Patient reports morning stiffness for 6 hours.   Patient Reports nocturnal pain.  Difficulty dressing/grooming: Denies Difficulty climbing stairs: Reports Difficulty getting out of chair: Denies Difficulty using hands for taps, buttons, cutlery, and/or writing: Reports  Review of Systems  Constitutional: Positive for fatigue.  HENT: Positive for mouth dryness.   Eyes: Positive for dryness.  Respiratory: Positive for shortness of breath.   Cardiovascular: Positive for swelling in legs/feet.  Gastrointestinal: Positive for constipation.  Endocrine: Positive for cold intolerance, excessive thirst and increased urination.  Genitourinary: Positive for difficulty  urinating and painful urination.  Musculoskeletal: Positive for arthralgias, gait problem, joint pain, joint swelling, muscle weakness, morning stiffness and muscle tenderness.  Skin: Negative for rash.  Allergic/Immunologic: Negative for susceptible to infections.  Neurological: Positive for numbness and weakness.  Hematological: Negative for bruising/bleeding tendency.  Psychiatric/Behavioral: Positive for sleep disturbance.    PMFS History:  Patient Active Problem List   Diagnosis Date Noted   Positive ANA (antinuclear antibody) 08/21/2020   Dry mouth 08/21/2020   Tobacco use disorder 08/21/2020   Arthralgia 08/21/2020   Raynaud's phenomenon 08/21/2020   Counseling for initiation of birth control method 01/23/2019   Major depression, recurrent ([facility]) 04/26/2018   Adult ADHD 12/16/2014   Asthma 05/07/2013   Hypothyroidism 11/26/2012   GAD (generalized anxiety disorder) 11/26/2012    Past Medical History:  Diagnosis Date   Anxiety    Takes xanax twice daily   Asthma    Chronic headaches    "multiple times a day; they say it's related to thyroid"   Complication of anesthesia    Dysrhythmia    tachycardia   Grave's disease    H/O seasonal allergies    Headache(784.0)    most days   Pneumonia 11/02/11   "years ago"   PONV (postoperative nausea and vomiting)    Thyroid disease     Family History  Problem Relation Age of Onset   Hypertension Mother    Hyperlipidemia Mother    Diabetes Mother    Hypertension Father    Hyperlipidemia Father    Hypothyroidism Father  Healthy Sister    Anesthesia problems Neg Hx    Hypotension Neg Hx    Malignant hyperthermia Neg Hx    Pseudochol deficiency Neg Hx    Past Surgical History:  Procedure Laterality Date   THYROIDECTOMY  11/02/2011   Procedure: THYROIDECTOMY;  Surgeon: Izora Gala, MD;  Location: [facility];  Service: ENT;  Laterality: Bilateral;  TOTAL THYROIDECTOMY   TOTAL THYROIDECTOMY   11/02/11   WISDOM TOOTH EXTRACTION  2004   Social History   Social History Narrative   Not on file   Immunization History  Administered Date(s) Administered   DTaP 08/24/1984, 11/05/1984, 12/31/1984, 12/24/1985   Hepatitis B 04/26/1996, 05/31/1996, 08/30/1996   IPV 08/24/1984, 11/05/1984, 12/24/1985   Influenza,inj,Quad PF,6+ Mos 05/25/2020   Influenza-Unspecified 06/06/2011, 04/25/2012   MMR 12/24/1985   PPD Test 03/16/2015, 03/23/2015   Pneumococcal Polysaccharide-23 11/03/2011   Tdap 12/10/2010     Objective: Vital Signs: BP 127/78 (BP Location: Left Arm, Patient Position: Sitting, Cuff Size: Normal)    Pulse 71    Resp 14    Ht 5' 7.5" (1.715 m)    Wt 156 lb 9.6 oz (71 kg)    BMI 24.17 kg/m    Physical Exam HENT:     Right Ear: External ear normal.     Left Ear: External ear normal.     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  Cardiovascular:     Rate and Rhythm: Normal rate and regular rhythm.  Pulmonary:     Effort: Pulmonary effort is normal.     Breath sounds: Normal breath sounds.  Skin:    General: Skin is warm and dry.     Findings: No rash.     Comments: Nailfold capillaroscopy with some dropout, no giant vessels or hemorrhages, no scarring or pitting seen Dusky discoloration of fingertips b/l distal to DIP joints no   Neurological:     Mental Status: She is alert.     Comments: Perpipheral subjectively diminished sensation over fingertips and toes, knee jerk hyperreflexia b/l, normal strength throughout  Psychiatric:        Mood and Affect: Mood normal.     Musculoskeletal Exam:  Neck full ROM no tenderness Shoulders full ROM, pain with overhead abduction and internal rotation with abduction no swelling Elbows full ROM no tenderness or swelling Wrists full ROM no tenderness or swelling Fingers full ROM no tenderness or swelling  Bilateral  lumbar paraspinal muscle tenderness worse on right Hip normal internal and external rotation without pain, Pace maneuver provokes posterior hip pain R>L, right lateral hip tenderness to palpation Knees full ROM no tenderness or swelling Ankles full ROM no tenderness or swelling Generalized tenderness to pressure over shin and ankle left worse than right, without pitting edema or erythema MTPs 1st digit tenderness rest normal   Investigation: No additional findings.  Imaging: No results found.  Recent Labs: Lab Results  Component Value Date   WBC 14.2 (H) 05/25/2020   HGB 14.5 05/25/2020   PLT 293 05/25/2020   NA 140 02/27/2020   K 4.0 02/27/2020   CL 97 02/27/2020   CO2 24 02/27/2020   GLUCOSE 80 02/27/2020   BUN 10 02/27/2020   CREATININE 1.05 (H) 02/27/2020   BILITOT 0.5 02/27/2020   ALKPHOS 54 02/27/2020   AST 37 02/27/2020   ALT 47 (H) 02/27/2020   PROT 7.6 02/27/2020   ALBUMIN  4.8 02/27/2020   CALCIUM 8.5 (L) 02/27/2020   GFRAA 80 02/27/2020    Speciality Comments: No specialty comments available.  Procedures:  No procedures performed Allergies: Patient has no known allergies.   Assessment / Plan:     Visit Diagnoses: Positive ANA (antinuclear antibody) Arthralgias -  Plan: ANA, Anti-scleroderma antibody, Anti-DNA antibody, double-stranded, C3 and C4, Protein / creatinine ratio, urine, Sedimentation rate, Rheumatoid factor, CBC with Differential/Platelet  Positive ANA with arthralgias, fatigue, possibly sicca syndrome, lymphadenopathy, raynaud's suggestive for CTD. Initial positive ANA and dsDNA which is relatively specific. No obivous inflammatory changes seen on exam today. Recommend repeat ANA with IFA pattern, Scl-70, dsDNA, complements, urine P/C ratio, ESR, RF, CBC for other systemic disease evidence.  Dry mouth  Not improving with home treatments such as biotene, does report increase in dental complications and leukoplakia on exam. Could be exacerbated  with some current medications although sicca syndrome possible which might benefit with secretagogue.  Postoperative hypothyroidism  Apparently under control again by last labs. Hyperreflexia on DTRs on exam certainly does not seem under-treated.  Adult ADHD  She is on CNS stimulant medication which can worsen peripheral vasoconstriction symptoms so if refractory can consider this.  Tobacco use disorder  Discussed role of tobacco use in vascular inflammation and nicotine vasoconstriction effect. Also could be a trigger for underlying CTD activity.  Raynaud's phenomenon without gangrene  Dusky finger appearance but no gangrene or ulcers seen. Peripheral neuropathy, tobacco use also possible contributors. BP is wnl can offer trial of CCB as first line treatment if getting more problems.  Orders: Orders Placed This Encounter  Procedures   ANA   Anti-scleroderma antibody   Anti-DNA antibody, double-stranded   C3 and C4   Protein / creatinine ratio, urine   Sedimentation rate   Rheumatoid factor   CBC with Differential/Platelet   No orders of the defined types were placed in this encounter.   Follow-Up Instructions: Return in about 2 weeks (around 09/04/2020) for New pt f/u.   Collier Salina, MD  Note - This record has been created using [employer].  Chart creation errors have been sought, but may not always  have been located. Such creation errors do not reflect on  the standard of medical care.

## 2020-08-21 ENCOUNTER — Encounter: Payer: Self-pay | Admitting: Internal Medicine

## 2020-08-21 ENCOUNTER — Ambulatory Visit: Payer: Medicaid Other | Admitting: Internal Medicine

## 2020-08-21 ENCOUNTER — Other Ambulatory Visit: Payer: Self-pay

## 2020-08-21 VITALS — BP 127/78 | HR 71 | Resp 14 | Ht 67.5 in | Wt 156.6 lb

## 2020-08-21 DIAGNOSIS — R682 Dry mouth, unspecified: Secondary | ICD-10-CM | POA: Diagnosis not present

## 2020-08-21 DIAGNOSIS — M2550 Pain in unspecified joint: Secondary | ICD-10-CM

## 2020-08-21 DIAGNOSIS — F909 Attention-deficit hyperactivity disorder, unspecified type: Secondary | ICD-10-CM

## 2020-08-21 DIAGNOSIS — M329 Systemic lupus erythematosus, unspecified: Secondary | ICD-10-CM | POA: Insufficient documentation

## 2020-08-21 DIAGNOSIS — I7300 Raynaud's syndrome without gangrene: Secondary | ICD-10-CM

## 2020-08-21 DIAGNOSIS — R768 Other specified abnormal immunological findings in serum: Secondary | ICD-10-CM

## 2020-08-21 DIAGNOSIS — E890 Postprocedural hypothyroidism: Secondary | ICD-10-CM

## 2020-08-21 DIAGNOSIS — F17200 Nicotine dependence, unspecified, uncomplicated: Secondary | ICD-10-CM

## 2020-08-25 LAB — CBC WITH DIFFERENTIAL/PLATELET
Absolute Monocytes: 380 cells/uL (ref 200–950)
Basophils Absolute: 97 cells/uL (ref 0–200)
Basophils Relative: 1.4 %
Eosinophils Absolute: 297 cells/uL (ref 15–500)
Eosinophils Relative: 4.3 %
HCT: 41 % (ref 35.0–45.0)
Hemoglobin: 14.2 g/dL (ref 11.7–15.5)
Lymphs Abs: 2864 cells/uL (ref 850–3900)
MCH: 31.7 pg (ref 27.0–33.0)
MCHC: 34.6 g/dL (ref 32.0–36.0)
MCV: 91.5 fL (ref 80.0–100.0)
MPV: 10 fL (ref 7.5–12.5)
Monocytes Relative: 5.5 %
Neutro Abs: 3264 cells/uL (ref 1500–7800)
Neutrophils Relative %: 47.3 %
Platelets: 328 10*3/uL (ref 140–400)
RBC: 4.48 10*6/uL (ref 3.80–5.10)
RDW: 12.8 % (ref 11.0–15.0)
Total Lymphocyte: 41.5 %
WBC: 6.9 10*3/uL (ref 3.8–10.8)

## 2020-08-25 LAB — PROTEIN / CREATININE RATIO, URINE
Creatinine, Urine: 123 mg/dL (ref 20–275)
Protein/Creat Ratio: 81 mg/g creat (ref 21–161)
Protein/Creatinine Ratio: 0.081 mg/mg creat (ref 0.021–0.16)
Total Protein, Urine: 10 mg/dL (ref 5–24)

## 2020-08-25 LAB — ANTI-NUCLEAR AB-TITER (ANA TITER): ANA Titer 1: 1:80 {titer} — ABNORMAL HIGH

## 2020-08-25 LAB — C3 AND C4
C3 Complement: 113 mg/dL (ref 83–193)
C4 Complement: 16 mg/dL (ref 15–57)

## 2020-08-25 LAB — RHEUMATOID FACTOR: Rheumatoid fact SerPl-aCnc: <14 IU/mL (ref <14)

## 2020-08-25 LAB — SEDIMENTATION RATE: Sed Rate: 6 mm/h (ref 0–20)

## 2020-08-25 LAB — ANTI-SCLERODERMA ANTIBODY: Scleroderma (Scl-70) (ENA) Antibody, IgG: <1 AI

## 2020-08-25 LAB — ANA: Anti Nuclear Antibody (ANA): POSITIVE — AB

## 2020-08-25 LAB — ANTI-DNA ANTIBODY, DOUBLE-STRANDED: ds DNA Ab: 46 IU/mL — ABNORMAL HIGH

## 2020-08-26 ENCOUNTER — Encounter: Payer: Self-pay | Admitting: Family Medicine

## 2020-08-26 ENCOUNTER — Ambulatory Visit (INDEPENDENT_AMBULATORY_CARE_PROVIDER_SITE_OTHER): Payer: Medicaid Other | Admitting: Family Medicine

## 2020-08-26 ENCOUNTER — Other Ambulatory Visit: Payer: Self-pay

## 2020-08-26 VITALS — BP 105/71 | HR 79 | Ht 67.5 in | Wt 153.0 lb

## 2020-08-26 DIAGNOSIS — E890 Postprocedural hypothyroidism: Secondary | ICD-10-CM

## 2020-08-26 DIAGNOSIS — F411 Generalized anxiety disorder: Secondary | ICD-10-CM | POA: Diagnosis not present

## 2020-08-26 DIAGNOSIS — F909 Attention-deficit hyperactivity disorder, unspecified type: Secondary | ICD-10-CM

## 2020-08-26 DIAGNOSIS — F331 Major depressive disorder, recurrent, moderate: Secondary | ICD-10-CM

## 2020-08-26 DIAGNOSIS — R768 Other specified abnormal immunological findings in serum: Secondary | ICD-10-CM | POA: Diagnosis not present

## 2020-08-26 MED ORDER — LISDEXAMFETAMINE DIMESYLATE 40 MG PO CAPS: 40.0000 mg | ORAL_CAPSULE | ORAL | 0 refills | Status: DC

## 2020-08-26 NOTE — Progress Notes (Signed)
BP 105/71   Pulse 79   Ht 5' 7.5" (1.715 m)   Wt 153 lb (69.4 kg)   SpO2 100%   BMI 23.61 kg/m    Subjective:   Patient ID: Valerie Ayala, female    DOB: 1986/06/19, 35 y.o.   MRN: [id number]  HPI: Valerie Ayala is a 35 y.o. female presenting on 08/26/2020 for Medical Management of Chronic Issues, Hypothyroidism, and ADHD   HPI  Patient is also coming for adult well exam and physical.  She denies any major issues except arthralgia she has been having an energy has been doing better since she has been taking her thyroid more consistently.  Hypothyroidism recheck Patient is coming in for thyroid recheck today as well. They deny any issues with hair changes or heat or cold problems or diarrhea or constipation. They deny any chest pain or palpitations. They are currently on levothyroxine 153mcrograms   Patient is seeing rheumatologist and still having arthralgias and they are working up the blood work to see which she has  Patient is due for gynecological exam and Pap smear because she has an abnormal and has not had one in many years but says she does not want to do it here and will schedule it.  Depression and anxiety recheck Patient is coming in for depression and anxiety recheck.  She feels like she doing really well and stable on her medicine.  She is currently taking Abilify and Prozac.  She denies any suicidal ideations or thoughts of hurting herself.  Relevant past medical, surgical, family and social history reviewed and updated as indicated. Interim medical history since our last visit reviewed. Allergies and medications reviewed and updated.  Review of Systems  Constitutional: Negative for chills and fever.  Eyes: Negative for redness and visual disturbance.  Respiratory: Negative for chest tightness and shortness of breath.   Cardiovascular: Negative for chest pain and leg swelling.  Musculoskeletal: Negative for back pain and gait problem.  Skin: Negative for rash.   Neurological: Negative for light-headedness and headaches.  Psychiatric/Behavioral: Negative for agitation, behavioral problems, decreased concentration and sleep disturbance. The patient is not nervous/anxious.   All other systems reviewed and are negative.   Per HPI unless specifically indicated above   Allergies as of 08/26/2020   No Known Allergies     Medication List       Accurate as of August 26, 2020 10:19 AM. If you have any questions, ask your nurse or doctor.        albuterol 108 (90 Base) MCG/ACT inhaler Commonly known as: VENTOLIN HFA Inhale 2 puffs into the lungs every 6 (six) hours as needed. For shortness of breath   ARIPiprazole 10 MG tablet Commonly known as: ABILIFY Take 1 tablet (10 mg total) by mouth daily.   atorvastatin 20 MG tablet Commonly known as: LIPITOR Take 1 tablet (20 mg total) by mouth at bedtime.   cholecalciferol 1000 units tablet Commonly known as: VITAMIN D Take 1,000 Units by mouth daily.   FLUoxetine 20 MG capsule Commonly known as: PROzac Take 1 capsule (20 mg total) by mouth daily.   levothyroxine 150 MCG tablet Commonly known as: SYNTHROID Take 1 tablet (150 mcg total) by mouth daily.   lisdexamfetamine 40 MG capsule Commonly known as: Vyvanse Take 1 capsule (40 mg total) by mouth every morning.   medroxyPROGESTERone 150 MG/ML injection Commonly known as: Depo-Provera Inject 1 mL (150 mg total) into the muscle every 3 (three) months.  Objective:   BP 105/71   Pulse 79   Ht 5' 7.5" (1.715 m)   Wt 153 lb (69.4 kg)   SpO2 100%   BMI 23.61 kg/m   Wt Readings from Last 3 Encounters:  08/26/20 153 lb (69.4 kg)  08/21/20 156 lb 9.6 oz (71 kg)  05/25/20 151 lb (68.5 kg)    Physical Exam Vitals and nursing note reviewed.  Constitutional:      General: She is not in acute distress.    Appearance: She is well-developed and well-nourished. She is not diaphoretic.  HENT:     Right Ear: Tympanic membrane and  ear canal normal.     Left Ear: Tympanic membrane and ear canal normal.  Eyes:     Extraocular Movements: Extraocular movements intact and EOM normal.     Conjunctiva/sclera: Conjunctivae normal.     Pupils: Pupils are equal, round, and reactive to light.  Cardiovascular:     Rate and Rhythm: Normal rate and regular rhythm.     Pulses: Intact distal pulses.     Heart sounds: Normal heart sounds. No murmur heard.   Pulmonary:     Effort: Pulmonary effort is normal. No respiratory distress.     Breath sounds: Normal breath sounds. No wheezing.  Abdominal:     General: Abdomen is flat. Bowel sounds are normal. There is no distension.     Tenderness: There is no abdominal tenderness. There is no right CVA tenderness, left CVA tenderness, guarding or rebound.  Musculoskeletal:        General: No tenderness or edema. Normal range of motion.  Skin:    General: Skin is warm and dry.     Findings: No rash.  Neurological:     Mental Status: She is alert and oriented to person, place, and time.     Coordination: Coordination normal.  Psychiatric:        Mood and Affect: Mood and affect normal.        Behavior: Behavior normal.       Assessment & Plan:   Problem List Items Addressed This Visit      Endocrine   Hypothyroidism   Relevant Orders   Lipid panel   TSH     Other   GAD (generalized anxiety disorder) - Primary   Relevant Orders   CBC with Differential/Platelet   Lipid panel   Adult ADHD   Relevant Medications   lisdexamfetamine (VYVANSE) 40 MG capsule   lisdexamfetamine (VYVANSE) 40 MG capsule (Start on 09/25/2020)   lisdexamfetamine (VYVANSE) 40 MG capsule (Start on 10/25/2020)   Other Relevant Orders   CMP14+EGFR   Major depression, recurrent (HCC)   Positive ANA (antinuclear antibody)      Patient needs To schedule her Pap smear ASAP and she knows that.  Continue current medication, no changes, will do blood work today. Follow up plan: Return in about 3  months (around 11/26/2020), or if symptoms worsen or fail to improve, for ADHD and thyroid recheck.  Counseling provided for all of the vaccine components No orders of the defined types were placed in this encounter.   Caryl Pina, MD [facility] 08/26/2020, 10:19 AM

## 2020-08-27 LAB — CBC WITH DIFFERENTIAL/PLATELET
Basophils Absolute: 0.1 10*3/uL (ref 0.0–0.2)
Basos: 1 %
EOS (ABSOLUTE): 0.3 10*3/uL (ref 0.0–0.4)
Eos: 3 %
Hematocrit: 41.9 % (ref 34.0–46.6)
Hemoglobin: 14.4 g/dL (ref 11.1–15.9)
Immature Grans (Abs): 0 10*3/uL (ref 0.0–0.1)
Immature Granulocytes: 0 %
Lymphocytes Absolute: 3.4 10*3/uL — ABNORMAL HIGH (ref 0.7–3.1)
Lymphs: 41 %
MCH: 31.9 pg (ref 26.6–33.0)
MCHC: 34.4 g/dL (ref 31.5–35.7)
MCV: 93 fL (ref 79–97)
Monocytes Absolute: 0.5 10*3/uL (ref 0.1–0.9)
Monocytes: 6 %
Neutrophils Absolute: 4 10*3/uL (ref 1.4–7.0)
Neutrophils: 49 %
Platelets: 259 10*3/uL (ref 150–450)
RBC: 4.51 x10E6/uL (ref 3.77–5.28)
RDW: 12.6 % (ref 11.7–15.4)
WBC: 8.2 10*3/uL (ref 3.4–10.8)

## 2020-08-27 LAB — LIPID PANEL
Chol/HDL Ratio: 5.8 ratio — ABNORMAL HIGH (ref 0.0–4.4)
Cholesterol, Total: 203 mg/dL — ABNORMAL HIGH (ref 100–199)
HDL: 35 mg/dL — ABNORMAL LOW (ref >39)
LDL Chol Calc (NIH): 140 mg/dL — ABNORMAL HIGH (ref 0–99)
Triglycerides: 154 mg/dL — ABNORMAL HIGH (ref 0–149)
VLDL Cholesterol Cal: 28 mg/dL (ref 5–40)

## 2020-08-27 LAB — CMP14+EGFR
ALT: 8 IU/L (ref 0–32)
AST: 16 IU/L (ref 0–40)
Albumin/Globulin Ratio: 2 (ref 1.2–2.2)
Albumin: 4.7 g/dL (ref 3.8–4.8)
Alkaline Phosphatase: 50 IU/L (ref 44–121)
BUN/Creatinine Ratio: 7 — ABNORMAL LOW (ref 9–23)
BUN: 7 mg/dL (ref 6–20)
Bilirubin Total: 0.6 mg/dL (ref 0.0–1.2)
CO2: 26 mmol/L (ref 20–29)
Calcium: 8 mg/dL — ABNORMAL LOW (ref 8.7–10.2)
Chloride: 99 mmol/L (ref 96–106)
Creatinine, Ser: 0.95 mg/dL (ref 0.57–1.00)
Globulin, Total: 2.3 g/dL (ref 1.5–4.5)
Glucose: 85 mg/dL (ref 65–99)
Potassium: 4 mmol/L (ref 3.5–5.2)
Sodium: 140 mmol/L (ref 134–144)
Total Protein: 7 g/dL (ref 6.0–8.5)
eGFR: 80 mL/min/{1.73_m2} (ref >59)

## 2020-08-27 LAB — TSH: TSH: 32.9 u[IU]/mL — ABNORMAL HIGH (ref 0.450–4.500)

## 2020-08-28 NOTE — Progress Notes (Signed)
Lab result shows a positive dsDNA so does look like lupus. There are no problems with the blood cell count or kidney function. We can discuss starting treatment options for this at follow up visit.

## 2020-09-02 ENCOUNTER — Encounter: Payer: Self-pay | Admitting: *Deleted

## 2020-09-03 ENCOUNTER — Other Ambulatory Visit: Payer: Self-pay

## 2020-09-03 MED ORDER — ATORVASTATIN CALCIUM 40 MG PO TABS: 40.0000 mg | ORAL_TABLET | Freq: Every evening | ORAL | 1 refills | Status: DC

## 2020-09-03 MED ORDER — LEVOTHYROXINE SODIUM 175 MCG PO TABS: 175.0000 ug | ORAL_TABLET | Freq: Every day | ORAL | 1 refills | Status: DC

## 2020-09-06 NOTE — Progress Notes (Signed)
Office Visit Note  Patient: Valerie Ayala             Date of Birth: July 16, 1985           MRN: [id number]             PCP: Dettinger, Fransisca Kaufmann, MD Referring: Dettinger, Fransisca Kaufmann, MD Visit Date: 09/07/2020   Subjective:  Follow-up (Patient complains of right shoulder, left knee, bilateral hand, and bilateral great toe pain. )   History of Present Illness: Valerie Ayala is a 36 y.o. female here for follow up for arthralgias of multiple sites and positive ANA with labs showing smith and dsDNA Abs consistent with lupus. She has the similar type of pain today but also complains of bilateral leg swelling during the past 2 weeks. This extends to below the knees and has not improved with compression socks or elevating her legs part of the day. She does not notice significant swelling elsewhere.     Review of Systems  Constitutional: Positive for fatigue.  HENT: Positive for mouth sores, mouth dryness and nose dryness.   Eyes: Positive for itching, visual disturbance and dryness. Negative for pain.  Respiratory: Positive for shortness of breath. Negative for cough, hemoptysis and difficulty breathing.   Cardiovascular: Positive for chest pain, palpitations and swelling in legs/feet.  Gastrointestinal: Positive for abdominal pain and constipation. Negative for blood in stool and diarrhea.  Endocrine: Positive for increased urination.  Genitourinary: Negative for painful urination.  Musculoskeletal: Positive for arthralgias, joint pain, joint swelling, myalgias, muscle weakness, morning stiffness, muscle tenderness and myalgias.  Skin: Positive for color change. Negative for rash and redness.  Allergic/Immunologic: Positive for susceptible to infections.  Neurological: Positive for numbness, headaches and weakness. Negative for dizziness and memory loss.  Hematological: Positive for swollen glands.  Psychiatric/Behavioral: Positive for sleep disturbance. Negative for confusion.    PMFS  History:  Patient Active Problem List   Diagnosis Date Noted  . Hypocalcemia 09/07/2020  . Vitamin D deficiency 09/07/2020  . Positive ANA (antinuclear antibody) 08/21/2020  . Dry mouth 08/21/2020  . Tobacco use disorder 08/21/2020  . Arthralgia 08/21/2020  . Raynaud's phenomenon 08/21/2020  . Counseling for initiation of birth control method 01/23/2019  . Major depression, recurrent ([place]) 04/26/2018  . Adult ADHD 12/16/2014  . Asthma 05/07/2013  . Hypothyroidism 11/26/2012  . GAD (generalized anxiety disorder) 11/26/2012    Past Medical History:  Diagnosis Date  . Anxiety    Takes xanax twice daily  . Asthma   . Chronic headaches    "multiple times a day; they say it's related to thyroid"  . Complication of anesthesia   . Dysrhythmia    tachycardia  . Grave's disease   . H/O seasonal allergies   . Headache(784.0)    most days  . Pneumonia 11/02/11   "years ago"  . PONV (postoperative nausea and vomiting)   . Thyroid disease     Family History  Problem Relation Age of Onset  . Hypertension Mother   . Hyperlipidemia Mother   . Diabetes Mother   . Hypertension Father   . Hyperlipidemia Father   . Hypothyroidism Father   . Healthy Sister   . Anesthesia problems Neg Hx   . Hypotension Neg Hx   . Malignant hyperthermia Neg Hx   . Pseudochol deficiency Neg Hx    Past Surgical History:  Procedure Laterality Date  . THYROIDECTOMY  11/02/2011   Procedure: THYROIDECTOMY;  Surgeon: Izora Gala, MD;  Location: [facility] OR;  Service: ENT;  Laterality: Bilateral;  TOTAL THYROIDECTOMY  . TOTAL THYROIDECTOMY  11/02/11  . WISDOM TOOTH EXTRACTION  2004   Social History   Social History Narrative  . Not on file   Immunization History  Administered Date(s) Administered  . DTaP 08/24/1984, 11/05/1984, 12/31/1984, 12/24/1985  . Hepatitis B 04/26/1996, 05/31/1996, 08/30/1996  . IPV 08/24/1984, 11/05/1984, 12/24/1985  . Influenza,inj,Quad PF,6+ Mos 05/25/2020  . Influenza-Unspecified  06/06/2011, 04/25/2012  . MMR 12/24/1985  . PPD Test 03/16/2015, 03/23/2015  . Pneumococcal Polysaccharide-23 11/03/2011  . Tdap 12/10/2010     Objective: Vital Signs: BP 129/79 (BP Location: Right Arm, Patient Position: Sitting, Cuff Size: Normal)   Pulse 81   Ht $R'5\' 7"'Wx$  (1.702 m)   Wt 164 lb 9.6 oz (74.7 kg)   BMI 25.78 kg/m    Physical Exam Eyes:     Conjunctiva/sclera: Conjunctivae normal.  Skin:    General: Skin is warm and dry.     Comments: Tense, 2+ pitting edema bilateral feet, ankles, and most way to knee, faint erythema on left side  Neurological:     General: No focal deficit present.     Mental Status: She is alert.  Psychiatric:        Mood and Affect: Mood normal.     Musculoskeletal Exam:  Wrists full ROM no tenderness or swelling Fingers full ROM no tenderness or swelling Knees full ROM tender, no swelling at or above knee Ankles and feet very tender b/l with overlying pitting edema  Investigation: No additional findings.  Imaging: No results found.  Recent Labs: Lab Results  Component Value Date   WBC 8.2 08/26/2020   HGB 14.4 08/26/2020   PLT 259 08/26/2020   NA 140 08/26/2020   K 4.0 08/26/2020   CL 99 08/26/2020   CO2 26 08/26/2020   GLUCOSE 85 08/26/2020   BUN 7 08/26/2020   CREATININE 0.95 08/26/2020   BILITOT 0.6 08/26/2020   ALKPHOS 50 08/26/2020   AST 16 08/26/2020   ALT 8 08/26/2020   PROT 7.0 08/26/2020   ALBUMIN 4.7 08/26/2020   CALCIUM 8.0 (L) 08/26/2020   GFRAA 80 02/27/2020    Speciality Comments: No specialty comments available.  Procedures:  No procedures performed Allergies: Patient has no known allergies.   Assessment / Plan:     Visit Diagnoses: Arthralgia, unspecified joint Positive ANA - Plan: hydroxychloroquine (PLAQUENIL) 200 MG tablet   Joint pains and fatigue and raynaud's with positive specific serology appears consistent with lupus. No obvious joint swelling or synovitis at this time. I do not believe  the lower extremity edema is from the same process. Recommend starting hydroxychloroquine 200 mg PO daily.  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 Severe hypocalcemia can contribute to lower extremity edema so will check for vitamin D level as well as common secondary cause.  Orders: Orders Placed This Encounter  Procedures  . VITAMIN D 25 Hydroxy (Vit-D Deficiency, Fractures)   Meds ordered this encounter  Medications  . hydroxychloroquine (PLAQUENIL) 200 MG tablet    Sig: Take 1.5 tablets (300 mg total) by mouth daily.    Dispense:  45 tablet    Refill:  2     Follow-Up Instructions: Return in about 2 months (around 11/07/2020) for SLE new HCQ start.   Collier Salina, MD  Note - This record has been created  using Editor, commissioning.  Chart creation errors have been sought, but may not always  have been located. Such creation errors do not reflect on  the standard of medical care.

## 2020-09-07 ENCOUNTER — Ambulatory Visit (INDEPENDENT_AMBULATORY_CARE_PROVIDER_SITE_OTHER): Payer: Medicaid Other | Admitting: Internal Medicine

## 2020-09-07 ENCOUNTER — Other Ambulatory Visit: Payer: Self-pay

## 2020-09-07 ENCOUNTER — Encounter: Payer: Self-pay | Admitting: Internal Medicine

## 2020-09-07 VITALS — BP 129/79 | HR 81 | Ht 67.0 in | Wt 164.6 lb

## 2020-09-07 DIAGNOSIS — M2550 Pain in unspecified joint: Secondary | ICD-10-CM | POA: Diagnosis not present

## 2020-09-07 DIAGNOSIS — E559 Vitamin D deficiency, unspecified: Secondary | ICD-10-CM | POA: Diagnosis not present

## 2020-09-07 DIAGNOSIS — R768 Other specified abnormal immunological findings in serum: Secondary | ICD-10-CM

## 2020-09-07 MED ORDER — HYDROXYCHLOROQUINE SULFATE 200 MG PO TABS: 300.0000 mg | ORAL_TABLET | Freq: Every day | ORAL | 2 refills | Status: DC

## 2020-09-07 NOTE — Patient Instructions (Signed)
Hydroxychloroquine tablets What is this medicine? HYDROXYCHLOROQUINE (hye drox ee KLOR oh kwin) is used to treat rheumatoid arthritis and systemic lupus erythematosus. It is also used to treat malaria. This medicine may be used for other purposes; ask your health care provider or pharmacist if you have questions. COMMON BRAND NAME(S): Plaquenil, Quineprox What should I tell my health care provider before I take this medicine? They need to know if you have any of these conditions:  diabetes  eye disease, vision problems  G6PD deficiency  heart disease  history of irregular heartbeat  if you often drink alcohol  kidney disease  liver disease  porphyria  psoriasis  an unusual or allergic reaction to chloroquine, hydroxychloroquine, other medicines, foods, dyes, or preservatives  pregnant or trying to get pregnant  breast-feeding How should I use this medicine? Take this medicine by mouth with a glass of water. Take it as directed on the prescription label. Do not cut, crush or chew this medicine. Swallow the tablets whole. Take it with food. Do not take it more than directed. Take all of this medicine unless your health care provider tells you to stop it early. Keep taking it even if you think you are better. Take products with antacids in them at a different time of day than this medicine. Take this medicine 4 hours before or 4 hours after antacids. Talk to your health care provider if you have questions. Talk to your pediatrician regarding the use of this medicine in children. While this drug may be prescribed for selected conditions, precautions do apply. Overdosage: If you think you have taken too much of this medicine contact a poison control center or emergency room at once. NOTE: This medicine is only for you. Do not share this medicine with others. What if I miss a dose? If you miss a dose, take it as soon as you can. If it is almost time for your next dose, take only  that dose. Do not take double or extra doses. What may interact with this medicine? Do not take this medicine with any of the following medications:  cisapride  dronedarone  pimozide  thioridazine This medicine may also interact with the following medications:  ampicillin  antacids  cimetidine  cyclosporine  digoxin  kaolin  medicines for diabetes, like insulin, glipizide, glyburide  medicines for seizures like carbamazepine, phenobarbital, phenytoin  mefloquine  methotrexate  other medicines that prolong the QT interval (cause an abnormal heart rhythm)  praziquantel This list may not describe all possible interactions. Give your health care provider a list of all the medicines, herbs, non-prescription drugs, or dietary supplements you use. Also tell them if you smoke, drink alcohol, or use illegal drugs. Some items may interact with your medicine. What should I watch for while using this medicine? Visit your health care provider for regular checks on your progress. Tell your health care provider if your symptoms do not start to get better or if they get worse. You may need blood work done while you are taking this medicine. If you take other medicines that can affect heart rhythm, you may need more testing. Talk to your health care provider if you have questions. Your vision may be tested before and during use of this medicine. Tell your health care provider right away if you have any change in your eyesight. This medicine may cause serious skin reactions. They can happen weeks to months after starting the medicine. Contact your health care provider right away if you   notice fevers or flu-like symptoms with a rash. The rash may be red or purple and then turn into blisters or peeling of the skin. Or, you might notice a red rash with swelling of the face, lips or lymph nodes in your neck or under your arms. If you or your family notice any changes in your behavior, such as new  or worsening depression, thoughts of harming yourself, anxiety, or other unusual or disturbing thoughts, or memory loss, call your health care provider right away. What side effects may I notice from receiving this medicine? Side effects that you should report to your doctor or health care professional as soon as possible:  allergic reactions (skin rash, itching or hives; swelling of the face, lips, or tongue)  changes in vision  decreased hearing, ringing in the ears  heartbeat rhythm changes (trouble breathing; chest pain; dizziness; fast, irregular heartbeat; feeling faint or lightheaded, falls)  liver injury (dark yellow or brown urine; general ill feeling or flu-like symptoms; loss of appetite, right upper belly pain; unusually weak or tired, yellowing of the eyes or skin)  low blood sugar (feeling anxious; confusion; dizziness; increased hunger; unusually weak or tired; increased sweating; shakiness; cold, clammy skin; irritable; headache; blurred vision; fast heartbeat; loss of consciousness)  low red blood cell counts (trouble breathing; feeling faint; lightheaded, falls; unusually weak or tired)  muscle weakness  pain, tingling, numbness in the hands or feet  rash, fever, and swollen lymph nodes  redness, blistering, peeling or loosening of the skin, including inside the mouth  suicidal thoughts, mood changes  uncontrollable head, mouth, neck, arm, or leg movements  unusual bruising or bleeding Side effects that usually do not require medical attention (report to your doctor or health care professional if they continue or are bothersome):  diarrhea  hair loss  irritable This list may not describe all possible side effects. Call your doctor for medical advice about side effects. You may report side effects to FDA at 1-800-FDA-1088. Where should I keep my medicine? Keep out of the reach of children and pets. Store at room temperature up to 30 degrees C (86 degrees F).  Protect from light. Get rid of any unused medicine after the expiration date. To get rid of medicines that are no longer needed or have expired:  Take the medicine to a medicine take-back program. Check with your pharmacy or law enforcement to find a location.  If you cannot return the medicine, check the label or package insert to see if the medicine should be thrown out in the garbage or flushed down the toilet. If you are not sure, ask your health care provider. If it is safe to put it in the trash, empty the medicine out of the container. Mix the medicine with cat litter, dirt, coffee grounds, or other unwanted substance. Seal the mixture in a bag or container. Put it in the trash. NOTE: This sheet is a summary. It may not cover all possible information. If you have questions about this medicine, talk to your doctor, pharmacist, or health care provider.  2021 Elsevier/Gold Standard (2019-12-02 15:07:49)  

## 2020-09-08 LAB — VITAMIN D 25 HYDROXY (VIT D DEFICIENCY, FRACTURES): Vit D, 25-Hydroxy: 19 ng/mL — ABNORMAL LOW (ref 30–100)

## 2020-09-15 ENCOUNTER — Ambulatory Visit: Payer: Self-pay | Admitting: Internal Medicine

## 2020-09-22 NOTE — Progress Notes (Signed)
Vitamin D level is low which can contribute to her abnormal calcium level and lower extremity swelling. She should start taking a daily supplement of 1000 units or increase her dose if already taking one.

## 2020-10-14 DIAGNOSIS — H5213 Myopia, bilateral: Secondary | ICD-10-CM | POA: Diagnosis not present

## 2020-11-02 ENCOUNTER — Other Ambulatory Visit: Payer: Self-pay | Admitting: Internal Medicine

## 2020-11-02 DIAGNOSIS — R768 Other specified abnormal immunological findings in serum: Secondary | ICD-10-CM

## 2020-11-02 DIAGNOSIS — M2550 Pain in unspecified joint: Secondary | ICD-10-CM

## 2020-11-02 DIAGNOSIS — R7689 Other specified abnormal immunological findings in serum: Secondary | ICD-10-CM

## 2020-11-02 NOTE — Telephone Encounter (Signed)
The correct dose is 300 mg daily. We can talk about alternative treatments in the clinic more effectively, she has a follow up visit planned in 2 wks.

## 2020-11-02 NOTE — Telephone Encounter (Signed)
Last Visit: 09/07/2020 Next Visit: 11/16/2020 Labs: CMP, CBC- 08/26/2020 Eye exam: not done yet  Current Dose per office note 09/07/2020: PLQ 200 mg PO daily- Rx was written for 300 mg (1.5 tablets) PO daily which is the dose patient is taking.  DX: Arthralgia, +ANA  Last Fill: 09/07/2020  Okay to refill Plaquenil?

## 2020-11-10 ENCOUNTER — Telehealth: Payer: Self-pay

## 2020-11-10 ENCOUNTER — Encounter: Payer: Self-pay | Admitting: Family Medicine

## 2020-11-10 NOTE — Telephone Encounter (Signed)
Mom informed that both boys, Marlene Bast and Elson Areas, have been scheduled to see Dr. Louanne Skye on 11/25/20.

## 2020-11-15 NOTE — Progress Notes (Deleted)
Office Visit Note  Patient: Valerie Ayala             Date of Birth: [birth year]           MRN: [id number]             PCP: Dettinger, Fransisca Kaufmann, MD Referring: Dettinger, Fransisca Kaufmann, MD Visit Date: 11/16/2020   Subjective:  No chief complaint on file.   History of Present Illness: Valerie Ayala is a 35 y.o. female here for follow up for SLE after starting hydroxychloroquine 341m daily.***     No Rheumatology ROS completed.   PMFS History:  Patient Active Problem List   Diagnosis Date Noted  . Hypocalcemia 09/07/2020  . Vitamin D deficiency 09/07/2020  . Positive ANA (antinuclear antibody) 08/21/2020  . Dry mouth 08/21/2020  . Tobacco use disorder 08/21/2020  . Arthralgia 08/21/2020  . Raynaud's phenomenon 08/21/2020  . Counseling for initiation of birth control method 01/23/2019  . Major depression, recurrent ([facility]io 04/26/2018  . Adult ADHD 12/16/2014  . Asthma 05/07/2013  . Hypothyroidism 11/26/2012  . GAD (generalized anxiety disorder) 11/26/2012    Past Medical History:  Diagnosis Date  . Anxiety    Takes xanax twice daily  . Asthma   . Chronic headaches    "multiple times a day; they say it's related to thyroid"  . Complication of anesthesia   . Dysrhythmia    tachycardia  . Grave's disease   . H/O seasonal allergies   . Headache(784.0)    most days  . Pneumonia 11/02/11   "years ago"  . PONV (postoperative nausea and vomiting)   . Thyroid disease     Family History  Problem Relation Age of Onset  . Hypertension Mother   . Hyperlipidemia Mother   . Diabetes Mother   . Hypertension Father   . Hyperlipidemia Father   . Hypothyroidism Father   . Healthy Sister   . Anesthesia problems Neg Hx   . Hypotension Neg Hx   . Malignant hyperthermia Neg Hx   . Pseudochol deficiency Neg Hx    Past Surgical History:  Procedure Laterality Date  . THYROIDECTOMY  11/02/2011   Procedure: THYROIDECTOMY;  Surgeon: JIzora Gala MD;  Location: [facility]  Service: ENT;   Laterality: Bilateral;  TOTAL THYROIDECTOMY  . TOTAL THYROIDECTOMY  11/02/11  . WISDOM TOOTH EXTRACTION  2004   Social History   Social History Narrative  . Not on file   Immunization History  Administered Date(s) Administered  . DTaP 08/24/1984, 11/05/1984, 12/31/1984, 12/24/1985  . Hepatitis B 04/26/1996, 05/31/1996, 08/30/1996  . IPV 08/24/1984, 11/05/1984, 12/24/1985  . Influenza,inj,Quad PF,6+ Mos 05/25/2020  . Influenza-Unspecified 06/06/2011, 04/25/2012  . MMR 12/24/1985  . PPD Test 03/16/2015, 03/23/2015  . Pneumococcal Polysaccharide-23 11/03/2011  . Tdap 12/10/2010     Objective: Vital Signs: There were no vitals taken for this visit.   Physical Exam   Musculoskeletal Exam: ***  CDAI Exam: CDAI Score: -- Patient Global: --; Provider Global: -- Swollen: --; Tender: -- Joint Exam 11/16/2020   No joint exam has been documented for this visit   There is currently no information documented on the homunculus. Go to the Rheumatology activity and complete the homunculus joint exam.  Investigation: No additional findings.  Imaging: No results found.  Recent Labs: Lab Results  Component Value Date   WBC 8.2 08/26/2020   HGB 14.4 08/26/2020   PLT 259 08/26/2020   NA 140 08/26/2020   K 4.0 08/26/2020  CL 99 08/26/2020   CO2 26 08/26/2020   GLUCOSE 85 08/26/2020   BUN 7 08/26/2020   CREATININE 0.95 08/26/2020   BILITOT 0.6 08/26/2020   ALKPHOS 50 08/26/2020   AST 16 08/26/2020   ALT 8 08/26/2020   PROT 7.0 08/26/2020   ALBUMIN 4.7 08/26/2020   CALCIUM 8.0 (L) 08/26/2020   GFRAA 80 02/27/2020    Speciality Comments: PLQ eye exam has not been done yet  Procedures:  No procedures performed Allergies: Patient has no known allergies.   Assessment / Plan:     Visit Diagnoses: No diagnosis found.  ***  Orders: No orders of the defined types were placed in this encounter.  No orders of the defined types were placed in this  encounter.    Follow-Up Instructions: No follow-ups on file.   Collier Salina, MD  Note - This record has been created using [employer].  Chart creation errors have been sought, but may not always  have been located. Such creation errors do not reflect on  the standard of medical care.

## 2020-11-16 ENCOUNTER — Ambulatory Visit: Payer: Medicaid Other | Admitting: Internal Medicine

## 2020-11-25 ENCOUNTER — Ambulatory Visit: Payer: Medicaid Other | Admitting: Family Medicine

## 2020-11-26 ENCOUNTER — Ambulatory Visit: Payer: Medicaid Other | Admitting: Family Medicine

## 2020-12-01 ENCOUNTER — Encounter: Payer: Self-pay | Admitting: Internal Medicine

## 2020-12-01 ENCOUNTER — Other Ambulatory Visit: Payer: Self-pay | Admitting: Radiology

## 2020-12-01 DIAGNOSIS — F411 Generalized anxiety disorder: Secondary | ICD-10-CM

## 2020-12-01 DIAGNOSIS — R768 Other specified abnormal immunological findings in serum: Secondary | ICD-10-CM

## 2020-12-01 DIAGNOSIS — F331 Major depressive disorder, recurrent, moderate: Secondary | ICD-10-CM

## 2020-12-01 DIAGNOSIS — M2550 Pain in unspecified joint: Secondary | ICD-10-CM

## 2020-12-01 NOTE — Telephone Encounter (Signed)
*  Patient needs a refill to bridge her until rescheduled 12/08/2020 follow-up.   Last Visit: 09/07/2020 Next Visit: 12/08/2020 Labs: 08/26/2020- CMP, CBC Eye exam: Nothing on file yet  Current Dose per office note 09/07/2020: Take 1 & 1/2 tablets (300 mg total) by mouth daily. DX: Arthralgia, unspecified joint; Positive ANA (antinuclear antibody)  Last Fill: 11/02/2020  Okay to refill Plaquenil?

## 2020-12-03 ENCOUNTER — Ambulatory Visit: Payer: Medicaid Other | Admitting: Family Medicine

## 2020-12-03 ENCOUNTER — Other Ambulatory Visit: Payer: Self-pay

## 2020-12-03 ENCOUNTER — Encounter: Payer: Self-pay | Admitting: Family Medicine

## 2020-12-03 VITALS — BP 125/76 | HR 98 | Ht 67.0 in | Wt 159.0 lb

## 2020-12-03 DIAGNOSIS — F411 Generalized anxiety disorder: Secondary | ICD-10-CM | POA: Diagnosis not present

## 2020-12-03 DIAGNOSIS — E782 Mixed hyperlipidemia: Secondary | ICD-10-CM | POA: Diagnosis not present

## 2020-12-03 DIAGNOSIS — F909 Attention-deficit hyperactivity disorder, unspecified type: Secondary | ICD-10-CM

## 2020-12-03 DIAGNOSIS — F331 Major depressive disorder, recurrent, moderate: Secondary | ICD-10-CM

## 2020-12-03 DIAGNOSIS — Z308 Encounter for other contraceptive management: Secondary | ICD-10-CM

## 2020-12-03 DIAGNOSIS — E890 Postprocedural hypothyroidism: Secondary | ICD-10-CM | POA: Diagnosis not present

## 2020-12-03 DIAGNOSIS — E785 Hyperlipidemia, unspecified: Secondary | ICD-10-CM | POA: Insufficient documentation

## 2020-12-03 LAB — PREGNANCY, URINE: Preg Test, Ur: NEGATIVE

## 2020-12-03 MED ORDER — HYDROXYCHLOROQUINE SULFATE 200 MG PO TABS: ORAL_TABLET | ORAL | 0 refills | Status: DC

## 2020-12-03 MED ORDER — MEDROXYPROGESTERONE ACETATE 150 MG/ML IM SUSP
150.0000 mg | Freq: Once | INTRAMUSCULAR | Status: AC
  Administered 2020-12-03: 150 mg via INTRAMUSCULAR

## 2020-12-03 MED ORDER — AMPHETAMINE-DEXTROAMPHET ER 20 MG PO CP24: 40.0000 mg | ORAL_CAPSULE | Freq: Every day | ORAL | 0 refills | Status: DC

## 2020-12-03 MED ORDER — ATORVASTATIN CALCIUM 40 MG PO TABS: 40.0000 mg | ORAL_TABLET | Freq: Every evening | ORAL | 1 refills | Status: DC

## 2020-12-03 NOTE — Progress Notes (Signed)
BP 125/76   Pulse 98   Ht 5' 7"  (1.702 m)   Wt 159 lb (72.1 kg)   SpO2 100%   BMI 24.90 kg/m    Subjective:   Patient ID: Valerie Ayala, female    DOB: 07/06/1985, 35 y.o.   MRN: [id number]  HPI: Valerie Ayala is a 35 y.o. female presenting on 12/03/2020 for No chief complaint on file.   HPI Adhd Current rx- was on vyvaanse, will switch to Adderall 40 mg daily # meds rx-60-20 mg tablets Effectiveness of current meds-Vyvanse is not working as well anymore so that is why we are making the switch Adverse reactions form meds-none  Pill count performed-No Last drug screen -03/06/2020 ( high risk q87m moderate risk q685mlow risk yearly ) Urine drug screen today- No [place]yes  If yes were their any concerning findings? -None  No flowsheet data found.   Controlled substance contract signed on: Today  Hypothyroidism recheck Patient is coming in for thyroid recheck today as well. They deny any issues with hair changes or heat or cold problems or diarrhea or constipation. They deny any chest pain or palpitations. They are currently on levothyroxine 175 micrograms   Anxiety depression recheck Patient is currently on Abilify and Prozac and feels like things are going well as far as anxiety and depression under control except for her energy is down but thinks that is a lot related to her autoimmune disorder. [facility] 08/26/2020 05/25/2020 02/27/2020 09/10/2018 08/08/2018  Decreased Interest 1 1 1 1 3   Down, Depressed, Hopeless 0 0 0 0 3  PHQ - 2 Score 1 1 1 1 6   Altered sleeping - - - - 3  Tired, decreased energy - - - - 3  Change in appetite - - - - 2  Feeling bad or failure about yourself  - - - - 3  Trouble concentrating - - - - 2  Moving slowly or fidgety/restless - - - - 1  Suicidal thoughts - - - - 1  PHQ-9 Score - - - - 21     We also discussed the possibility of a sleep disorder because she has a lot of daytime somnolence but she does not want  to do the sleep study right now because she has so much else on her plate but we will consider that in the future.  Relevant past medical, surgical, family and social history reviewed and updated as indicated. Interim medical history since our last visit reviewed. Allergies and medications reviewed and updated.  Review of Systems  Constitutional:  Positive for fatigue. Negative for chills and fever.  Eyes:  Negative for visual disturbance.  Respiratory:  Negative for chest tightness and shortness of breath.   Cardiovascular:  Negative for chest pain and leg swelling.  Skin:  Negative for rash.  Neurological:  Negative for light-headedness and headaches.  Psychiatric/Behavioral:  Positive for decreased concentration, dysphoric mood and sleep disturbance. Negative for agitation and behavioral problems. The patient is nervous/anxious.   All other systems reviewed and are negative.  Per HPI unless specifically indicated above   Allergies as of 12/03/2020   No Known Allergies      Medication List        Accurate as of December 03, 2020  2:15 PM. If you have any questions, ask your nurse or doctor.          albuterol 108 (90 Base) MCG/ACT inhaler Commonly known as: VENTOLIN  HFA Inhale 2 puffs into the lungs every 6 (six) hours as needed. For shortness of breath   ARIPiprazole 10 MG tablet Commonly known as: ABILIFY Take 1 tablet (10 mg total) by mouth daily.   atorvastatin 40 MG tablet Commonly known as: LIPITOR Take 1 tablet (40 mg total) by mouth at bedtime.   cholecalciferol 1000 units tablet Commonly known as: VITAMIN D Take 1,000 Units by mouth daily.   FLUoxetine 20 MG capsule Commonly known as: PROzac Take 1 capsule (20 mg total) by mouth daily.   hydroxychloroquine 200 MG tablet Commonly known as: PLAQUENIL Take 1 & 1/2 tablets (300 mg total) by mouth daily.   levothyroxine 175 MCG tablet Commonly known as: SYNTHROID Take 1 tablet (175 mcg total) by mouth daily  before breakfast.   lisdexamfetamine 40 MG capsule Commonly known as: Vyvanse Take 1 capsule (40 mg total) by mouth every morning.   lisdexamfetamine 40 MG capsule Commonly known as: Vyvanse Take 1 capsule (40 mg total) by mouth every morning.   lisdexamfetamine 40 MG capsule Commonly known as: Vyvanse Take 1 capsule (40 mg total) by mouth every morning.   medroxyPROGESTERone 150 MG/ML injection Commonly known as: Depo-Provera Inject 1 mL (150 mg total) into the muscle every 3 (three) months.         Objective:   There were no vitals taken for this visit.  Wt Readings from Last 3 Encounters:  09/07/20 164 lb 9.6 oz (74.7 kg)  08/26/20 153 lb (69.4 kg)  08/21/20 156 lb 9.6 oz (71 kg)    Physical Exam Vitals and nursing note reviewed.  Constitutional:      General: She is not in acute distress.    Appearance: She is well-developed. She is not diaphoretic.  Eyes:     Conjunctiva/sclera: Conjunctivae normal.  Cardiovascular:     Rate and Rhythm: Normal rate and regular rhythm.     Heart sounds: Normal heart sounds. No murmur heard. Pulmonary:     Effort: Pulmonary effort is normal. No respiratory distress.     Breath sounds: Normal breath sounds. No wheezing.  Skin:    General: Skin is warm and dry.     Findings: No rash.  Neurological:     Mental Status: She is alert and oriented to person, place, and time.     Coordination: Coordination normal.  Psychiatric:        Behavior: Behavior normal.      Assessment & Plan:   Problem List Items Addressed This Visit       Endocrine   Hypothyroidism - Primary   Relevant Orders   TSH     Other   GAD (generalized anxiety disorder)   Relevant Orders   CBC with Differential/Platelet   CMP14+EGFR   Adult ADHD   Relevant Orders   CBC with Differential/Platelet   CMP14+EGFR   Major depression, recurrent (HCC)   Hyperlipidemia   Relevant Medications   atorvastatin (LIPITOR) 40 MG tablet   Other Relevant  Orders   CBC with Differential/Platelet   CMP14+EGFR   Lipid panel   Other Visit Diagnoses     Encounter for other contraceptive management       Relevant Medications   medroxyPROGESTERone (DEPO-PROVERA) injection 150 mg (Start on 12/03/2020  3:15 PM)   Other Relevant Orders   Pregnancy, urine       Patient got Depo shot, urine pregnancy was negative.  We will do blood work today to recheck thyroid.  Thyroid and anxiety and  ADHD Follow up plan: Return in about 3 months (around 03/05/2021), or if symptoms worsen or fail to improve, for Thyroid and anxiety and ADHD.  Counseling provided for all of the vaccine components No orders of the defined types were placed in this encounter.   Caryl Pina, MD [facility] 12/03/2020, 2:15 PM

## 2020-12-04 LAB — CMP14+EGFR
ALT: 9 IU/L (ref 0–32)
AST: 16 IU/L (ref 0–40)
Albumin/Globulin Ratio: 1.6 (ref 1.2–2.2)
Albumin: 4.2 g/dL (ref 3.8–4.8)
Alkaline Phosphatase: 53 IU/L (ref 44–121)
BUN/Creatinine Ratio: 11 (ref 9–23)
BUN: 11 mg/dL (ref 6–20)
Bilirubin Total: 0.4 mg/dL (ref 0.0–1.2)
CO2: 23 mmol/L (ref 20–29)
Calcium: 7.8 mg/dL — ABNORMAL LOW (ref 8.7–10.2)
Chloride: 99 mmol/L (ref 96–106)
Creatinine, Ser: 0.98 mg/dL (ref 0.57–1.00)
Globulin, Total: 2.6 g/dL (ref 1.5–4.5)
Glucose: 74 mg/dL (ref 65–99)
Potassium: 4.1 mmol/L (ref 3.5–5.2)
Sodium: 140 mmol/L (ref 134–144)
Total Protein: 6.8 g/dL (ref 6.0–8.5)
eGFR: 77 mL/min/{1.73_m2} (ref >59)

## 2020-12-04 LAB — CBC WITH DIFFERENTIAL/PLATELET
Basophils Absolute: 0.1 10*3/uL (ref 0.0–0.2)
Basos: 1 %
EOS (ABSOLUTE): 0.3 10*3/uL (ref 0.0–0.4)
Eos: 4 %
Hematocrit: 37.8 % (ref 34.0–46.6)
Hemoglobin: 13.1 g/dL (ref 11.1–15.9)
Immature Grans (Abs): 0 10*3/uL (ref 0.0–0.1)
Immature Granulocytes: 0 %
Lymphocytes Absolute: 3 10*3/uL (ref 0.7–3.1)
Lymphs: 34 %
MCH: 30.3 pg (ref 26.6–33.0)
MCHC: 34.7 g/dL (ref 31.5–35.7)
MCV: 87 fL (ref 79–97)
Monocytes Absolute: 0.5 10*3/uL (ref 0.1–0.9)
Monocytes: 5 %
Neutrophils Absolute: 5 10*3/uL (ref 1.4–7.0)
Neutrophils: 56 %
Platelets: 300 10*3/uL (ref 150–450)
RBC: 4.33 x10E6/uL (ref 3.77–5.28)
RDW: 12.7 % (ref 11.7–15.4)
WBC: 8.9 10*3/uL (ref 3.4–10.8)

## 2020-12-04 LAB — LIPID PANEL
Chol/HDL Ratio: 4.2 ratio (ref 0.0–4.4)
Cholesterol, Total: 172 mg/dL (ref 100–199)
HDL: 41 mg/dL (ref >39)
LDL Chol Calc (NIH): 111 mg/dL — ABNORMAL HIGH (ref 0–99)
Triglycerides: 110 mg/dL (ref 0–149)
VLDL Cholesterol Cal: 20 mg/dL (ref 5–40)

## 2020-12-04 LAB — TSH: TSH: 3.2 u[IU]/mL (ref 0.450–4.500)

## 2020-12-08 ENCOUNTER — Ambulatory Visit: Payer: Medicaid Other | Admitting: Internal Medicine

## 2020-12-08 ENCOUNTER — Other Ambulatory Visit: Payer: Self-pay

## 2020-12-08 ENCOUNTER — Encounter: Payer: Self-pay | Admitting: Internal Medicine

## 2020-12-08 VITALS — BP 110/76 | HR 102 | Ht 67.0 in | Wt 159.2 lb

## 2020-12-08 DIAGNOSIS — M329 Systemic lupus erythematosus, unspecified: Secondary | ICD-10-CM

## 2020-12-08 DIAGNOSIS — E559 Vitamin D deficiency, unspecified: Secondary | ICD-10-CM

## 2020-12-08 DIAGNOSIS — M7989 Other specified soft tissue disorders: Secondary | ICD-10-CM | POA: Insufficient documentation

## 2020-12-08 DIAGNOSIS — I7300 Raynaud's syndrome without gangrene: Secondary | ICD-10-CM

## 2020-12-08 DIAGNOSIS — Z79899 Other long term (current) drug therapy: Secondary | ICD-10-CM | POA: Diagnosis not present

## 2020-12-08 NOTE — Patient Instructions (Signed)
Anti-DNA Antibody Test Why am I having this test? The anti-DNA antibody test helps with the diagnosis and follow-up of systemic lupus erythematosus (SLE). It is also used to monitor treatment of thiscondition as the antibody decreases with successful therapy. What is being tested? This test measures the amount of anti-DNA antibody in the blood. This antibody is found in 65-80% of patients with active SLE. This antibody is not as commonin patients who have other diseases. What kind of sample is taken?  A blood sample is required for this test. It is usually collected by insertinga needle into a blood vessel. How are the results reported? Your test results will be reported as a value. Your test results may also be reported as positive, intermediate, or negative. Your health care provider will compare your results to normal ranges that were established after testing a large group of people (reference values). Reference values may vary among labs and hospitals. For this test, common reference values are: Positive: 10 or more international units/mL. Intermediate: 5-9 international units/mL. Negative: Less than 5 international units/mL. What do the results mean? Positive results, which are associated with results that are higher than the reference values, may indicate: Autoimmune disorders such as SLE. Infectious mononucleosis. Chronic liver conditions. Intermediate results mean that the anti-DNA antibody levels are higher thannormal, but not high enough to be considered positive. Negative results mean that you do not have the anti-DNA antibody that isassociated with these conditions. Talk with your health care provider about what your results mean. Questions to ask your health care provider Ask your health care provider, or the department that is doing the test: When will my results be ready? How will I get my results? What are my treatment options? What other tests do I need? What are my  next steps? Summary The anti-DNA antibody test helps with the diagnosis and follow-up of systemic lupus erythematosus (SLE). It is also used to monitor treatment of this condition as the antibody decreases with successful therapy. This test measures the amount of anti-DNA antibody in the blood. Elevated levels of anti-DNA antibody can be seen in patients with SLE and certain other conditions. This information is not intended to replace advice given to you by your health care provider. Make sure you discuss any questions you have with your healthcare provider. Document Revised: 02/14/2020 Document Reviewed: 02/14/2020 Elsevier Patient Education  2022 [employer]  Immunoglobulin Quantification Test Why am I having this test? The immunoglobulin (Ig) quantification test is used to detect and monitor various diseases, including infections, chronic liver disease, some cancers,autoimmune diseases, and acquired immunodeficiency syndrome (AIDS). What is being tested? This test checks for the concentration of immune system proteins (antibodies) called immunoglobulins in the blood. They include IgG, IgM, IgA, IgD, and IgE. Immunoglobulin levels may increase for a number of reasons, including the presence of certain cancers. In these types of cancer, the cells that produce immunoglobulins (plasma cells) divide rapidly and release more immunoglobulins. Decreased immunoglobulin levels are often found in people with a deficiency in their immune system thatcould be due to a disease or treatment for a disease. What kind of sample is taken?  A blood sample is required for this test. It is usually collected by insertinga needle into a blood vessel. How are the results reported? Your test results will be reported as values. Your health care provider will compare your results to normal ranges that were established after testing a large group of people (reference ranges). Reference ranges may vary among  labs and  hospitals. For this test, common reference ranges are: Immunoglobulin G (IgG). Adults: 565-1,765 mg/dL. Children: 250-1,600 mg/dL. Immunoglobulin A (IgA). Adults: 85-385 mg/dL. Children: 1-350 mg/dL. Immunoglobulin M (IgM). Adults: 55-375 mg/dL. Children: 20-200 mg/dL. Immunoglobulin D (IgD) and Immunoglobulin E (IgE). Minimal. What do the results mean? Levels of IgG that are higher than the reference range may indicate: Different infections. Autoimmune diseases. Chronic liver disease. Levels of IgG that are lower than the reference range may be associated with: AIDS. Different types of cancer. Various causes of suppressed immunity, including medications or treatments for diseases such as cancer. Levels of IgA that are higher than the reference range may indicate: Chronic liver diseases. Chronic infections. Levels of IgA that are lower than the reference range may be associated with: Various causes of immunoglobulin deficiency, including medications or treatments for diseases such as cancer. Levels of IgM that are higher than the reference range may indicate: Certain rare cancers. Different infections. Autoimmune diseases. Chronic liver conditions. Levels of IgM that are lower than the reference range may be associated with: AIDS. Various causes of immunoglobulin deficiency. Various causes of suppressed immunity, including medications or treatments for diseases such as cancer. Levels of IgE that are higher than the reference range may indicate: Allergic reactions or allergic infections. Levels of IgE that are lower than the reference range may indicate: Inherited immunoglobulin deficiency. Talk with your health care provider about what your results mean. Questions to ask your health care provider Ask your health care provider, or the department that is doing the test: When will my results be ready? How will I get my results? What are my treatment options? What other tests  do I need? What are my next steps? Summary The immunoglobulin quantification test is performed to detect and monitor various diseases. Immunoglobulins (Ig) are a type of antibody in the blood. They include IgG, IgM, IgA, IgD, and IgE. The levels of these antibodies may increase due to a number of conditions, such as in certain cancers. The levels of these antibodies may decrease because of a problem in the immune system. Talk with your health care provider about what your results may mean. This information is not intended to replace advice given to you by your health care provider. Make sure you discuss any questions you have with your healthcare provider. Document Revised: 02/14/2020 Document Reviewed: 02/14/2020 Elsevier Patient Education  2022 [employer].

## 2020-12-08 NOTE — Progress Notes (Signed)
Office Visit Note  Patient: Valerie Ayala             Date of Birth: [birth year]           MRN: [id number]             PCP: Dettinger, Fransisca Kaufmann, MD Referring: Dettinger, Fransisca Kaufmann, MD Visit Date: 12/08/2020   Subjective:  Follow-up (Patient complains of continued right shoulder, left knee, bilateral hand, and bilateral great toe pain. Yesterday patient had issues with bilateral lower leg edema and pain. )   History of Present Illness: Valerie Ayala is a 35 y.o. female here for follow up for systemic lupus on hydroxychloroquine 300 mg p.o. daily. No signfiicant improvement since starting hydroxychloroquine 8 weeks ago.  She has not yet seen ophthalmology for retinal exam but plans to do so. Leg swelling continues to come and go, residual erythema and pain and burning sensations.  She continues to feel extremely fatigued as the most noticeable symptom.  She is also had a few mouth sores headaches and swollen glands but also describes the ongoing leg swelling sometimes with pain and discoloration.  Currently her left distal leg is most affected today.  Lupus manifestations Fatigue Arthralgias Photosensitive rash Oral ulcers Raynaud's  Lupus serology ANA 1:80 speckled dsDNA 49 Complement C3, C4 wnl Vit D 19   Review of Systems  Constitutional:  Positive for fatigue.  HENT:  Positive for mouth sores. Negative for mouth dryness and nose dryness.   Eyes:  Negative for pain, itching, visual disturbance and dryness.  Respiratory:  Negative for cough, hemoptysis, shortness of breath and difficulty breathing.   Cardiovascular:  Positive for swelling in legs/feet. Negative for chest pain and palpitations.  Gastrointestinal:  Positive for constipation. Negative for abdominal pain, blood in stool and diarrhea.  Endocrine: Positive for increased urination.  Genitourinary:  Negative for painful urination.  Musculoskeletal:  Positive for joint pain, joint pain, joint swelling, myalgias, muscle  weakness, morning stiffness, muscle tenderness and myalgias.  Skin:  Negative for color change, rash and redness.  Allergic/Immunologic: Negative for susceptible to infections.  Neurological:  Positive for headaches and weakness. Negative for dizziness, numbness and memory loss.  Hematological:  Positive for swollen glands.  Psychiatric/Behavioral:  Positive for sleep disturbance. Negative for confusion.    PMFS History:  Patient Active Problem List   Diagnosis Date Noted   Swelling of lower extremity 12/08/2020   High risk medication use 12/08/2020   Hyperlipidemia 12/03/2020   Hypocalcemia 09/07/2020   Vitamin D deficiency 09/07/2020   Systemic lupus ([place]) 08/21/2020   Dry mouth 08/21/2020   Tobacco use disorder 08/21/2020   Arthralgia 08/21/2020   Raynaud's phenomenon 08/21/2020   Major depression, recurrent ([place]) 04/26/2018   Adult ADHD 12/16/2014   Asthma 05/07/2013   Hypothyroidism 11/26/2012   GAD (generalized anxiety disorder) 11/26/2012    Past Medical History:  Diagnosis Date   Anxiety    Takes xanax twice daily   Asthma    Chronic headaches    "multiple times a day; they say it's related to thyroid"   Complication of anesthesia    Dysrhythmia    tachycardia   Grave's disease    H/O seasonal allergies    Headache(784.0)    most days   Pneumonia 11/02/11   "years ago"   PONV (postoperative nausea and vomiting)    Thyroid disease     Family History  Problem Relation Age of Onset   Hypertension Mother  Hyperlipidemia Mother    Diabetes Mother    Hypertension Father    Hyperlipidemia Father    Hypothyroidism Father    Healthy Sister    Anesthesia problems Neg Hx    Hypotension Neg Hx    Malignant hyperthermia Neg Hx    Pseudochol deficiency Neg Hx    Past Surgical History:  Procedure Laterality Date   THYROIDECTOMY  11/02/2011   Procedure: THYROIDECTOMY;  Surgeon: Izora Gala, MD;  Location: [place];  Service: ENT;  Laterality: Bilateral;  TOTAL  THYROIDECTOMY   TOTAL THYROIDECTOMY  11/02/11   WISDOM TOOTH EXTRACTION  2004   Social History   Social History Narrative   Not on file   Immunization History  Administered Date(s) Administered   DTaP 08/24/1984, 11/05/1984, 12/31/1984, 12/24/1985   Hepatitis B 04/26/1996, 05/31/1996, 08/30/1996   IPV 08/24/1984, 11/05/1984, 12/24/1985   Influenza,inj,Quad PF,6+ Mos 05/25/2020   Influenza-Unspecified 06/06/2011, 04/25/2012   MMR 12/24/1985   PPD Test 03/16/2015, 03/23/2015   Pneumococcal Polysaccharide-23 11/03/2011   Tdap 12/10/2010     Objective: Vital Signs: BP 110/76 (BP Location: Left Arm, Patient Position: Sitting, Cuff Size: Normal)   Pulse (!) 102   Ht $R'5\' 7"'TG$  (1.702 m)   Wt 159 lb 3.2 oz (72.2 kg)   BMI 24.93 kg/m    Physical Exam Skin:    General: Skin is warm and dry.     Comments: Trace edema above left shin with skin erythema and superficial venous varicosities present along medial aspect with surrounding tenderness and erythema  Neurological:     General: No focal deficit present.     Mental Status: She is alert.     Musculoskeletal Exam:  Shoulders full ROM no tenderness or swelling Elbows full ROM no tenderness or swelling Wrists full ROM no tenderness or swelling Fingers full ROM no tenderness or swelling Knees full ROM no tenderness or swelling Ankles full ROM   Investigation: No additional findings.  Imaging: No results found.  Recent Labs: Lab Results  Component Value Date   WBC 8.9 12/03/2020   HGB 13.1 12/03/2020   PLT 300 12/03/2020   NA 140 12/03/2020   K 4.1 12/03/2020   CL 99 12/03/2020   CO2 23 12/03/2020   GLUCOSE 74 12/03/2020   BUN 11 12/03/2020   CREATININE 0.98 12/03/2020   BILITOT 0.4 12/03/2020   ALKPHOS 53 12/03/2020   AST 16 12/03/2020   ALT 9 12/03/2020   PROT 6.8 12/03/2020   ALBUMIN 4.2 12/03/2020   CALCIUM 7.8 (L) 12/03/2020   GFRAA 80 02/27/2020    Speciality Comments: PLQ eye exam has not been done  yet  Procedures:  No procedures performed Allergies: Patient has no known allergies.   Assessment / Plan:     Visit Diagnoses: Systemic lupus erythematosus, unspecified SLE type, unspecified organ involvement status ([place]) - Plan: Anti-DNA antibody, double-stranded, Protein / creatinine ratio, urine  Symptoms and serology appear consistent with lupus though no evidence of severe organ system involvement.  Recheck double-stranded DNA  We will for marker of disease activity and urine protein creatinine ratio.  Continue hydroxychloroquine at 300 mg daily although she has not had substantial symptom improvement on this.  Discussed possible treatments to add I do not believe currently a good candidate for methotrexate.  Possible Benlysta as an option for skin and joint and mucocutaneous disease that would not exacerbate other problems.  Vitamin D deficiency - Plan: VITAMIN D 25 Hydroxy (Vit-D Deficiency, Fractures)  Vitamin D low at  19 3 months ago, directed to start a high oral replacement dose will check for adequacy today  Raynaud's phenomenon without gangrene  Symptoms stable no digital pitting ulceration or lesions.  Swelling of lower extremity  Recurrent lower extremity swelling without significant lesion or ulceration but very painful and erythematous with numerous superficial varicosities visible.  Will refer for vein vascular evaluation possible venous insufficiency.  High risk medication use - Plan: IgG, IgA, IgM, QuantiFERON-TB Gold Plus, Hepatitis C antibody, Hepatitis B core antibody, IgM, Hepatitis B surface antigen  Checking baseline labs for DMARD use including TB screen, hepatitis screen, Quantitative immunoglobulins.  Orders: Orders Placed This Encounter  Procedures   Anti-DNA antibody, double-stranded   Protein / creatinine ratio, urine   VITAMIN D 25 Hydroxy (Vit-D Deficiency, Fractures)   IgG, IgA, IgM   QuantiFERON-TB Gold Plus   Hepatitis C antibody   Hepatitis B  core antibody, IgM   Hepatitis B surface antigen    No orders of the defined types were placed in this encounter.    Follow-Up Instructions: Return in about 3 months (around 03/10/2021) for SLE on HCQ f/u 49mo - Benlysta possibly.   CCollier Salina MD  Note - This record has been created using [employer]  Chart creation errors have been sought, but may not always  have been located. Such creation errors do not reflect on  the standard of medical care.

## 2020-12-09 ENCOUNTER — Other Ambulatory Visit: Payer: Self-pay | Admitting: Family Medicine

## 2020-12-09 DIAGNOSIS — F331 Major depressive disorder, recurrent, moderate: Secondary | ICD-10-CM

## 2020-12-09 DIAGNOSIS — F411 Generalized anxiety disorder: Secondary | ICD-10-CM

## 2020-12-09 DIAGNOSIS — F909 Attention-deficit hyperactivity disorder, unspecified type: Secondary | ICD-10-CM

## 2020-12-11 LAB — HEPATITIS B SURFACE ANTIGEN: Hepatitis B Surface Ag: NONREACTIVE

## 2020-12-11 LAB — QUANTIFERON-TB GOLD PLUS
Mitogen-NIL: 3.31 IU/mL
NIL: 0.04 IU/mL
QuantiFERON-TB Gold Plus: NEGATIVE
TB1-NIL: 0 IU/mL
TB2-NIL: 0.02 IU/mL

## 2020-12-11 LAB — HEPATITIS B CORE ANTIBODY, IGM: Hep B C IgM: NONREACTIVE

## 2020-12-11 LAB — ANTI-DNA ANTIBODY, DOUBLE-STRANDED: ds DNA Ab: 49 IU/mL — ABNORMAL HIGH

## 2020-12-11 LAB — PROTEIN / CREATININE RATIO, URINE
Creatinine, Urine: 135 mg/dL (ref 20–275)
Protein/Creat Ratio: 459 mg/g creat — ABNORMAL HIGH (ref 21–161)
Protein/Creatinine Ratio: 0.459 mg/mg creat — ABNORMAL HIGH (ref 0.021–0.161)
Total Protein, Urine: 62 mg/dL — ABNORMAL HIGH (ref 5–24)

## 2020-12-11 LAB — IGG, IGA, IGM
IgG (Immunoglobin G), Serum: 1072 mg/dL (ref 600–1640)
IgM, Serum: 86 mg/dL (ref 50–300)
Immunoglobulin A: 131 mg/dL (ref 47–310)

## 2020-12-11 LAB — HEPATITIS C ANTIBODY
Hepatitis C Ab: NONREACTIVE
SIGNAL TO CUT-OFF: 0.01 (ref <1.00)

## 2020-12-11 LAB — VITAMIN D 25 HYDROXY (VIT D DEFICIENCY, FRACTURES): Vit D, 25-Hydroxy: 19 ng/mL — ABNORMAL LOW (ref 30–100)

## 2020-12-11 NOTE — Progress Notes (Signed)
Spoke with Valerie Ayala her labs show double-stranded DNA is about the same, proteinuria is increased but slightly less than 500 so no urgent recommendation for work-up of this.  Hepatitis and tuberculosis screening tests are negative.  Discussed possible addition of medicine such as Benlysta.  Wait and see if she is able to be scheduled in a good timeframe with vascular for her leg symptoms probably before trying any additional treatments.

## 2020-12-23 ENCOUNTER — Other Ambulatory Visit: Payer: Self-pay

## 2020-12-23 DIAGNOSIS — I872 Venous insufficiency (chronic) (peripheral): Secondary | ICD-10-CM

## 2020-12-30 ENCOUNTER — Other Ambulatory Visit: Payer: Self-pay

## 2020-12-30 ENCOUNTER — Ambulatory Visit (INDEPENDENT_AMBULATORY_CARE_PROVIDER_SITE_OTHER): Payer: Medicaid Other | Admitting: Physician Assistant

## 2020-12-30 ENCOUNTER — Ambulatory Visit (HOSPITAL_COMMUNITY)
Admission: RE | Admit: 2020-12-30 | Discharge: 2020-12-30 | Disposition: A | Discharge status: Home / Self Care | Payer: Medicaid Other | Source: Ambulatory Visit | Attending: Vascular Surgery | Admitting: Vascular Surgery

## 2020-12-30 VITALS — BP 129/77 | HR 99 | Temp 98.3°F | Resp 20 | Ht 67.0 in | Wt 157.2 lb

## 2020-12-30 DIAGNOSIS — I872 Venous insufficiency (chronic) (peripheral): Secondary | ICD-10-CM

## 2020-12-30 DIAGNOSIS — M79605 Pain in left leg: Secondary | ICD-10-CM

## 2020-12-30 NOTE — Progress Notes (Signed)
, But   Requested by:  Sheliah Hatch, MD  Reason for consultation: Recurrent lower extremity swelling without significant lesion or ulceration but very painful and erythematous with numerous superficial varicosities visible.  Will refer for vein vascular evaluation possible venous insufficiency.    History of Present Illness   Valerie Ayala is a 35 y.o. (Sep 27, 1985) female who presents for evaluation of left lower leg sensitivity lateral lower extremity swelling.  She states she has had onset of the symptoms for approximately 1 year.  She feels as though she has "blood pooling around the left ankle.  She states anything that comes in contact with her skin of her left ankle region is extremely sensitive and painful.  She has no prior history of DVT or venous intervention. She has used compression stockings in the past but believes they were not well fitted and she tried to don them while her legs are swollen.  She also states elevation does not seem to work to alleviate her symptoms. PMH: significant for systemic lupus erythematosus, Raynaud's phenomenon  Venous symptoms include: positive if (X) [  ] aching [  ] heavy [ x] tired  [  ] throbbing [  ] burning  [  ] itching [ x ]swelling [  ] bleeding [  ] ulcer  Onset/duration: 1 year Occupation: Homemaker Aggravating factors: none Alleviating factors: none Compression:  yes Helps:  no Pain medications:  none Previous vein procedures:  none History of DVT:  no  Past Medical History:  Diagnosis Date   Anxiety    Takes xanax twice daily   Asthma    Chronic headaches    "multiple times a day; they say it's related to thyroid"   Complication of anesthesia    Dysrhythmia    tachycardia   Grave's disease    H/O seasonal allergies    Headache(784.0)    most days   Pneumonia 11/02/11   "years ago"   PONV (postoperative nausea and vomiting)    Thyroid disease     Past Surgical History:  Procedure Laterality Date    THYROIDECTOMY  11/02/2011   Procedure: THYROIDECTOMY;  Surgeon: Serena Colonel, MD;  Location: [facility] OR;  Service: ENT;  Laterality: Bilateral;  TOTAL THYROIDECTOMY   TOTAL THYROIDECTOMY  11/02/11   WISDOM TOOTH EXTRACTION  2004    Social History   Socioeconomic History   Marital status: Legally Separated    Spouse name: Not on file   Number of children: Not on file   Years of education: Not on file   Highest education level: Not on file  Occupational History   Not on file  Tobacco Use   Smoking status: Every Day    Packs/day: 1.00    Years: 12.00    Pack years: 12.00    Types: Cigarettes   Smokeless tobacco: Never  Vaping Use   Vaping Use: Former  Substance and Sexual Activity   Alcohol use: Not Currently   Drug use: No   Sexual activity: Yes    Birth control/protection: I.U.D.  Other Topics Concern   Not on file  Social History Narrative   Not on file   Social Determinants of Health   Financial Resource Strain: Not on file  Food Insecurity: Not on file  Transportation Needs: Not on file  Physical Activity: Not on file  Stress: Not on file  Social Connections: Not on file  Intimate Partner Violence: Not on file    Family History  Problem Relation Age  of Onset   Hypertension Mother    Hyperlipidemia Mother    Diabetes Mother    Hypertension Father    Hyperlipidemia Father    Hypothyroidism Father    Healthy Sister    Anesthesia problems Neg Hx    Hypotension Neg Hx    Malignant hyperthermia Neg Hx    Pseudochol deficiency Neg Hx     Current Outpatient Medications  Medication Sig Dispense Refill   albuterol (PROVENTIL HFA;VENTOLIN HFA) 108 (90 BASE) MCG/ACT inhaler Inhale 2 puffs into the lungs every 6 (six) hours as needed. For shortness of breath     amphetamine-dextroamphetamine (ADDERALL XR) 20 MG 24 hr capsule Take 2 capsules (40 mg total) by mouth daily. 60 capsule 0   [START ON 01/02/2021] amphetamine-dextroamphetamine (ADDERALL XR) 20 MG 24 hr capsule Take  2 capsules (40 mg total) by mouth daily. (Patient not taking: Reported on 12/08/2020) 60 capsule 0   [START ON 02/01/2021] amphetamine-dextroamphetamine (ADDERALL XR) 20 MG 24 hr capsule Take 2 capsules (40 mg total) by mouth daily. (Patient not taking: Reported on 12/08/2020) 60 capsule 0   ARIPiprazole (ABILIFY) 10 MG tablet Take 1 tablet (10 mg total) by mouth daily. 90 tablet 3   atorvastatin (LIPITOR) 40 MG tablet Take 1 tablet (40 mg total) by mouth at bedtime. 90 tablet 1   cholecalciferol (VITAMIN D) 1000 UNITS tablet Take 1,000 Units by mouth daily.      FLUoxetine (PROZAC) 20 MG capsule Take 1 capsule by mouth daily. 90 capsule 0   hydroxychloroquine (PLAQUENIL) 200 MG tablet Take 1 & 1/2 tablets (300 mg total) by mouth daily. 12 tablet 0   levothyroxine (SYNTHROID) 175 MCG tablet Take 1 tablet (175 mcg total) by mouth daily before breakfast. 90 tablet 2   medroxyPROGESTERone (DEPO-PROVERA) 150 MG/ML injection Inject 1 mL (150 mg total) into the muscle every 3 (three) months. 1 mL 4   No current facility-administered medications for this visit.    No Known Allergies  REVIEW OF SYSTEMS (negative unless checked):   Cardiac:  []  Chest pain or chest pressure? []  Shortness of breath upon activity? []  Shortness of breath when lying flat? []  Irregular heart rhythm?  Vascular:  []  Pain in calf, thigh, or hip brought on by walking? []  Pain in feet at night that wakes you up from your sleep? []  Blood clot in your veins? [x]  Leg swelling?  Pulmonary:  []  Oxygen at home? []  Productive cough? []  Wheezing?  Neurologic:  []  Sudden weakness in arms or legs? []  Sudden numbness in arms or legs? []  Sudden onset of difficult speaking or slurred speech? []  Temporary loss of vision in one eye? []  Problems with dizziness?  Gastrointestinal:  []  Blood in stool? []  Vomited blood?  Genitourinary:  []  Burning when urinating? []  Blood in urine?  Psychiatric:  []  Major  depression  Hematologic:  []  Bleeding problems? []  Problems with blood clotting?  Dermatologic:  []  Rashes or ulcers?  Constitutional:  []  Fever or chills?  Ear/Nose/Throat:  []  Change in hearing? []  Nose bleeds? []  Sore throat?  Musculoskeletal:  []  Back pain? []  Joint pain? []  Muscle pain?   Physical Examination     Vitals:   12/30/20 1451  Weight: 157 lb 3.2 oz (71.3 kg)  Height: 5\' 7"  (1.702 m)    General:  WDWN in NAD; vital signs documented above Gait: unaided; no ataxia HENT: WNL, normocephalic Pulmonary: normal non-labored breathing  Cardiac: regular HR Skin: without rashes Vascular Exam/Pulses: 2+  posterior tibial pulses bilaterally Extremities: with varicose veins, with reticular veins, without edema, without stasis pigmentation, without lipodermatosclerosis, without ulcers Musculoskeletal: no muscle wasting or atrophy  Neurologic: A&O X 3;  No focal weakness or paresthesias are detected Psychiatric:  The pt has Normal affect.  Non-invasive Vascular Imaging   BLE Venous Insufficiency Duplex  12/30/2020 Left:  - No evidence of deep vein thrombosis seen in the left lower extremity,  from the common femoral through the popliteal veins.  - No evidence of superficial venous reflux seen in the left short  saphenous vein.  - Venous reflux is noted in the left common femoral vein.  - Venous reflux is noted in the left sapheno-femoral junction.  - Venous reflux is noted in the left greater saphenous vein in the thigh.  - Venous reflux is noted in the left greater saphenous vein in the calf.   Medical Decision Making   TARAE WOODEN is a 35 y.o. 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  She has ultrasound evidence of reflux of the left greater saphenous vein in the proximal and mid thigh region.  The greater saphenous vein is mildly  dilated in this segment only.  There is reflux at the [facility].  No  evidence of DVT or SVT.  No evidence of arterial insufficiency.  Based on the patient's history and examination, I recommend: healthy vein routine. She is given written material with instructions. I discussed with the patient the use of 15-20 mm Hg compression stockings and to don these first thing in the morning prior to getting out of bed.  We also discussed proper positioning for effective elevation. Encouraged smoking cessation. Encouraged her to follow-up should these measures not assist in alleviating her symptoms. Thank you for allowing [place] to participate in this patient's care.   Milinda Antis, PA-C [facility] Office: [phone]  12/30/2020, 2:18 PM  Clinic MD: Dr. Edilia Bo

## 2021-01-12 ENCOUNTER — Encounter: Payer: Self-pay | Admitting: Internal Medicine

## 2021-01-12 NOTE — Telephone Encounter (Signed)
I took a look their findings show some venous insufficiency without complication. We talked about starting Benlysta as a weekly injectable medication. I am happy to discuss any questions or concerns if she has them I think we provided medication info, otherwise I can have Devki see about starting this medication.

## 2021-01-20 ENCOUNTER — Telehealth: Payer: Self-pay

## 2021-01-20 ENCOUNTER — Telehealth: Payer: Self-pay | Admitting: Pharmacist

## 2021-01-20 DIAGNOSIS — M329 Systemic lupus erythematosus, unspecified: Secondary | ICD-10-CM

## 2021-01-20 NOTE — Telephone Encounter (Signed)
Patient left a voicemail stating she sent a MyChart message about changing her medication.  Patient requested a return call to let her know if it has been called into the pharmacy.

## 2021-01-20 NOTE — Telephone Encounter (Addendum)
Please start Benlysta SQ BIV.  Dose: 200mg  every 7 days  Dx: SLE (M32.9)  See review from Dr. below   ----- Message from Dimple Casey, MD sent at 01/20/2021  3:23 PM EDT ----- Regarding: 01/22/2021 Start Recommending starting [facility] for Valerie Ayala for systemic lupus with arthritis, rashes, oral ulcers, proteinuria, fatigue after inadequate response to hydroxychloroquine.

## 2021-01-20 NOTE — Telephone Encounter (Signed)
Spoke with patient, advised Valerie Ayala has initiated the process to start the medication. Patient expressed understanding.

## 2021-01-21 ENCOUNTER — Other Ambulatory Visit (HOSPITAL_COMMUNITY): Payer: Self-pay

## 2021-01-21 ENCOUNTER — Encounter: Payer: Self-pay | Admitting: Internal Medicine

## 2021-01-21 MED ORDER — BENLYSTA 200 MG/ML ~~LOC~~ SOAJ
200.0000 mg | SUBCUTANEOUS | 0 refills | Status: DC
  Filled 2021-01-21: qty 4, fill #0
  Filled 2021-01-25: qty 4, 28d supply, fill #0

## 2021-01-21 NOTE — Telephone Encounter (Signed)
Received notification from [facility] regarding a prior authorization for BENLYSTA. Authorization has been APPROVED from 01/21/2021 to 01/21/2022.   Patient can fill through [facility]: [phone]   Key: [id number] Authorization # [id number]  Test claim revealed that insurance covers $4,288.41, leaving patient with a copay of $4.00.

## 2021-01-21 NOTE — Telephone Encounter (Signed)
[facility] and [facility] have been updated. Rx is scheduled to be shipped by 8/2 and delivered to [facility] clinic on 8/3. Pt's payment method has been passed along to pharmacy.

## 2021-01-21 NOTE — Telephone Encounter (Signed)
Rx for Benlysta 200mg  weekly sent to [facility] to be couriered to clinic to use supply for new start visit.  Called patient to review that medication will be couriered from pharmacy and that the pharmacy will reach out to collect $4 copay.  I've sent patient a Mychart message with information about Benlysta to review.  She is scheduled for Benlysta new start visit on 01/28/21.  03/30/21, PharmD, MPH, BCPS Clinical Pharmacist (Rheumatology and Pulmonology)

## 2021-01-22 ENCOUNTER — Other Ambulatory Visit: Payer: Self-pay | Admitting: Internal Medicine

## 2021-01-22 DIAGNOSIS — M7989 Other specified soft tissue disorders: Secondary | ICD-10-CM

## 2021-01-22 DIAGNOSIS — M329 Systemic lupus erythematosus, unspecified: Secondary | ICD-10-CM

## 2021-01-22 NOTE — Telephone Encounter (Signed)
FYI- I spoke with Ms. Golebiewski. Her proteinuria is increased compared to earlier this year, usually I would not refer for possible biopsy at this level, but she reports increasing pedal edema. We will recheck her urine protein test when she comes in next week for medication start visit.

## 2021-01-23 ENCOUNTER — Encounter: Payer: Self-pay | Admitting: Internal Medicine

## 2021-01-23 ENCOUNTER — Encounter: Payer: Self-pay | Admitting: Family Medicine

## 2021-01-25 ENCOUNTER — Other Ambulatory Visit (HOSPITAL_COMMUNITY): Payer: Self-pay

## 2021-01-26 ENCOUNTER — Other Ambulatory Visit (HOSPITAL_COMMUNITY): Payer: Self-pay

## 2021-01-27 NOTE — Telephone Encounter (Signed)
Valerie Ayala received in the office via [employer] courier. Placed in fridge #2.

## 2021-01-28 ENCOUNTER — Other Ambulatory Visit: Payer: Self-pay

## 2021-01-28 ENCOUNTER — Other Ambulatory Visit (HOSPITAL_COMMUNITY): Payer: Self-pay

## 2021-01-28 ENCOUNTER — Ambulatory Visit: Payer: Medicaid Other | Admitting: Pharmacist

## 2021-01-28 DIAGNOSIS — M329 Systemic lupus erythematosus, unspecified: Secondary | ICD-10-CM | POA: Diagnosis not present

## 2021-01-28 DIAGNOSIS — M7989 Other specified soft tissue disorders: Secondary | ICD-10-CM

## 2021-01-28 MED ORDER — BENLYSTA 200 MG/ML ~~LOC~~ SOAJ
200.0000 mg | SUBCUTANEOUS | 1 refills | Status: DC
Start: 2021-01-28 — End: 2021-03-23
  Filled 2021-01-28 – 2021-02-18 (×2): qty 4, 28d supply, fill #0
  Filled 2021-03-16: qty 4, 28d supply, fill #1

## 2021-01-28 NOTE — Patient Instructions (Signed)
Your next Benlysta dose is due on 8/11, 8/18, and every 7 days thereafter  CONTINUE hydroxychloroquine  as prescribed  Your prescription will be shipped from [facility]. Their phone number is [phone] Please call to schedule shipment and confirm address. They will mail your medication to your home.  We will call you from results from today.  Lab hours are from Monday to Thursday 1:30-4:30pm and Friday 1:30-4pm. You do not need an appointment if you come for labs during these times.  How to manage an injection site reaction: Remember the 5 C's: COUNTER - leave on the counter at least 30 minutes but up to overnight to bring medication to room temperature. This may help prevent stinging COLD - place something cold (like an ice gel pack or cold water bottle) on the injection site just before cleansing with alcohol. This may help reduce pain CLARITIN - use Claritin (generic name is loratadine) for the first two weeks of treatment or the day of, the day before, and the day after injecting. This will help to minimize injection site reactions CORTISONE CREAM - apply if injection site is irritated and itching CALL ME - if injection site reaction is bigger than the size of your fist, looks infected, blisters, or if you develop hives

## 2021-01-28 NOTE — Progress Notes (Addendum)
Pharmacy Note  Subjective:   Patient presents to clinic today to receive first dose of Benlysta. She takes hydroxychloroquine 300mg  once daily. She states that her depression and bipolar are well-controlled on aripiprazole and fluoxetine.  Patient running a fever or have signs/symptoms of infection? No  Patient currently on antibiotics for the treatment of infection? No  Patient have any upcoming invasive procedures/surgeries? No  Objective: CMP     Component Value Date/Time   NA 140 12/03/2020 1504   K 4.1 12/03/2020 1504   CL 99 12/03/2020 1504   CO2 23 12/03/2020 1504   GLUCOSE 74 12/03/2020 1504   GLUCOSE 84 09/26/2012 0834   BUN 11 12/03/2020 1504   CREATININE 0.98 12/03/2020 1504   CREATININE 0.87 09/26/2012 0834   CALCIUM 7.8 (L) 12/03/2020 1504   PROT 6.8 12/03/2020 1504   ALBUMIN 4.2 12/03/2020 1504   AST 16 12/03/2020 1504   ALT 9 12/03/2020 1504   ALKPHOS 53 12/03/2020 1504   BILITOT 0.4 12/03/2020 1504   GFRNONAA 69 02/27/2020 1343   GFRNONAA >89 09/26/2012 0834   GFRAA 80 02/27/2020 1343   GFRAA >89 09/26/2012 0834    CBC    Component Value Date/Time   WBC 8.9 12/03/2020 1504   WBC 6.9 08/21/2020 1024   RBC 4.33 12/03/2020 1504   RBC 4.48 08/21/2020 1024   HGB 13.1 12/03/2020 1504   HCT 37.8 12/03/2020 1504   PLT 300 12/03/2020 1504   MCV 87 12/03/2020 1504   MCH 30.3 12/03/2020 1504   MCH 31.7 08/21/2020 1024   MCHC 34.7 12/03/2020 1504   MCHC 34.6 08/21/2020 1024   RDW 12.7 12/03/2020 1504   LYMPHSABS 3.0 12/03/2020 1504   MONOABS 0.8 10/19/2011 2230   EOSABS 0.3 12/03/2020 1504   BASOSABS 0.1 12/03/2020 1504    Baseline Immunosuppressant Therapy Labs TB GOLD Quantiferon TB Gold Latest Ref Rng & Units 12/08/2020  Quantiferon TB Gold Plus NEGATIVE NEGATIVE   Hepatitis Panel Hepatitis Latest Ref Rng & Units 12/08/2020  Hep B Surface Ag NON-REACTIVE NON-REACTIVE  Hep B IgM NON-REACTIVE NON-REACTIVE  Hep C Ab NON-REACTIVE NON-REACTIVE  Hep  C Ab NON-REACTIVE NON-REACTIVE   HIV No results found for: HIV Immunoglobulins Immunoglobulin Electrophoresis Latest Ref Rng & Units 12/08/2020  IgA  47 - 310 mg/dL 12/10/2020  IgG 017 - 494 mg/dL 4,967  IgM 50 - 5,916 mg/dL 86   SPEP Serum Protein Electrophoresis Latest Ref Rng & Units 12/03/2020  Total Protein 6.0 - 8.5 g/dL 6.8   Chest x-ray: 02/02/2021 - no active cardiopulmonary disease  Assessment/Plan:  Benlysta information was sent to patient via MyChart and she reviewed this information in addition to conducting extensive review of the medication. She reviewed patient experience forums as well. Counseled patient that 11/30/57 is a B-cell inhibitor.  Counseled patient on purpose, proper use, and adverse effects of Benlysta.  Reviewed the most common adverse effects including infections, headache, nausea/diarrhea and injection site reactions. Discussed the risk of PML and neurologic signs/symptoms to monitor. Reviewed increased risk of depression and suicidality.   Patient consented to [facility]  Will upload consent into the media tab.  Reviewed storage instructions of Benlysta.  Patient voiced understanding.    She is aware of risk of depression while taking this medication and has been advised to notify her PCP Dr. [facility] if she feels that her depression is no longer well-controlled while taking Benlysta.  Demonstrated proper injection technique with Benlysta demo device  Patient able to demonstrate proper  injection technique using the teach back method.  Patient self injected in the right upper thigh with:  Pharmacy-supplied Medication: Benlysta 200mg /mL NDC: Lot: [id number] Expiration: 06/2023  Patient tolerated well.  Observed for 30 mins in office for adverse reaction and none noted.   She will continue Benlysta 200mg  SQ once weekly She will continue hydroxychloroquine as prescribed 300 mg once daily and we reviewed that Dr. 07/2023 may make changes at f/u visit on  03/10/21  Benlysta approved through insurance [facility]).   Rx sent to: [facility]: [phone] .  She took remaining pharmacy-supplied supply home today. Patient provided with pharmacy phone number and advised the pharmacy will reach out to schedule shipment to her home.  We will recheck her urine protein/creatinine ratio today.  All questions encouraged and answered.  Instructed patient to call with any further questions or concerns.  [facility], PharmD, MPH, BCPS Clinical Pharmacist (Rheumatology and Pulmonology)  01/28/2021 9:02 AM

## 2021-01-29 ENCOUNTER — Encounter: Payer: Self-pay | Admitting: Radiology

## 2021-01-29 LAB — PROTEIN / CREATININE RATIO, URINE
Creatinine, Urine: 35 mg/dL (ref 20–275)
Protein/Creat Ratio: 57 mg/g creat (ref 21–161)
Protein/Creatinine Ratio: 0.057 mg/mg creat (ref 0.021–0.161)
Total Protein, Urine: 2 mg/dL — ABNORMAL LOW (ref 5–24)

## 2021-01-29 NOTE — Progress Notes (Signed)
Urine test shows no continued significant amount of protein, which is good. This seems less likely to explain leg swelling being increased at the same time.

## 2021-02-02 ENCOUNTER — Other Ambulatory Visit: Payer: Self-pay | Admitting: Internal Medicine

## 2021-02-02 DIAGNOSIS — M2550 Pain in unspecified joint: Secondary | ICD-10-CM

## 2021-02-02 DIAGNOSIS — R768 Other specified abnormal immunological findings in serum: Secondary | ICD-10-CM

## 2021-02-02 NOTE — Telephone Encounter (Signed)
Last Visit: 12/08/2020 Next Visit: 03/10/2021 Labs: 12/03/2020 CBC, CMP Eye exam: nothing on file   Current Dose per office note 12/08/2020: PLQ 200 mg- Take 1 & 1/2 tablets (300 mg total) by mouth daily DX: Systemic lupus erythematosus, unspecified SLE type, unspecified organ involvement status  Last Fill: 12/03/2020  Okay to refill Plaquenil?

## 2021-02-16 ENCOUNTER — Other Ambulatory Visit (HOSPITAL_COMMUNITY): Payer: Self-pay

## 2021-02-18 ENCOUNTER — Other Ambulatory Visit (HOSPITAL_COMMUNITY): Payer: Self-pay

## 2021-02-22 ENCOUNTER — Other Ambulatory Visit (HOSPITAL_COMMUNITY): Payer: Self-pay

## 2021-02-25 ENCOUNTER — Other Ambulatory Visit: Payer: Self-pay

## 2021-02-25 ENCOUNTER — Encounter: Payer: Self-pay | Admitting: Family Medicine

## 2021-02-25 ENCOUNTER — Ambulatory Visit (INDEPENDENT_AMBULATORY_CARE_PROVIDER_SITE_OTHER): Payer: Medicaid Other | Admitting: Family Medicine

## 2021-02-25 VITALS — BP 106/65 | HR 76 | Ht 67.0 in | Wt 163.0 lb

## 2021-02-25 DIAGNOSIS — Z716 Tobacco abuse counseling: Secondary | ICD-10-CM | POA: Diagnosis not present

## 2021-02-25 DIAGNOSIS — E890 Postprocedural hypothyroidism: Secondary | ICD-10-CM | POA: Diagnosis not present

## 2021-02-25 DIAGNOSIS — F909 Attention-deficit hyperactivity disorder, unspecified type: Secondary | ICD-10-CM

## 2021-02-25 DIAGNOSIS — Z3042 Encounter for surveillance of injectable contraceptive: Secondary | ICD-10-CM

## 2021-02-25 MED ORDER — AMPHETAMINE-DEXTROAMPHET ER 20 MG PO CP24: 40.0000 mg | ORAL_CAPSULE | Freq: Every day | ORAL | 0 refills | Status: DC

## 2021-02-25 MED ORDER — VARENICLINE TARTRATE 1 MG PO TABS: 1.0000 mg | ORAL_TABLET | Freq: Two times a day (BID) | ORAL | 1 refills | Status: DC

## 2021-02-25 MED ORDER — VARENICLINE TARTRATE 0.5 MG X 11 & 1 MG X 42 PO MISC: ORAL | 0 refills | Status: DC

## 2021-02-25 MED ORDER — MEDROXYPROGESTERONE ACETATE 150 MG/ML IM SUSP
150.0000 mg | Freq: Once | INTRAMUSCULAR | Status: AC
  Administered 2021-02-25: 150 mg via INTRAMUSCULAR

## 2021-02-25 NOTE — Progress Notes (Signed)
BP 106/65   Pulse 76   Ht 5\' 7"  (1.702 m)   Wt 163 lb (73.9 kg)   SpO2 99%   BMI 25.53 kg/m    Subjective:   Patient ID: , female    DOB: Jul 26, 1985, 35 y.o.   MRN: [id number]  HPI: Valerie Ayala is a 35 y.o. female presenting on 02/25/2021 for Medical Management of Chronic Issues, Anxiety, Hypothyroidism, and Depression   HPI Adhd  Current rx-Adderall XR 20 mg, 2 capsules daily # meds rx-60 Effectiveness of current meds-works well Adverse reactions form meds-none  Pill count performed-No Last drug screen -03/06/2020 ( high risk q7m, moderate risk q38m, low risk yearly ) Urine drug screen today- Yes Was the NCCSR reviewed-yes  If yes were their any concerning findings? -None  No flowsheet data found.   Controlled substance contract signed on: Today  Hypothyroidism recheck Patient is coming in for thyroid recheck today as well. They deny any issues with hair changes or heat or cold problems or diarrhea or constipation. They deny any chest pain or palpitations. They are currently on levothyroxine 175 micrograms   Patient wants to come in to discuss smoking cessation as well she would like to try Chantix.  She is currently smoking about a pack per day.  Relevant past medical, surgical, family and social history reviewed and updated as indicated. Interim medical history since our last visit reviewed. Allergies and medications reviewed and updated.  Review of Systems  Constitutional:  Negative for chills and fever.  Eyes:  Negative for redness and visual disturbance.  Respiratory:  Negative for chest tightness and shortness of breath.   Cardiovascular:  Negative for chest pain and leg swelling.  Genitourinary:  Negative for difficulty urinating and dysuria.  Musculoskeletal:  Negative for back pain and gait problem.  Skin:  Negative for rash.  Neurological:  Negative for light-headedness and headaches.  Psychiatric/Behavioral:  Positive for decreased  concentration. Negative for agitation, behavioral problems, dysphoric mood and sleep disturbance. The patient is not nervous/anxious.   All other systems reviewed and are negative.  Per HPI unless specifically indicated above   Allergies as of 02/25/2021   No Known Allergies      Medication List        Accurate as of February 25, 2021  4:36 PM. If you have any questions, ask your nurse or doctor.          albuterol 108 (90 Base) MCG/ACT inhaler Commonly known as: VENTOLIN HFA Inhale 2 puffs into the lungs every 6 (six) hours as needed. For shortness of breath   amphetamine-dextroamphetamine 20 MG 24 hr capsule Commonly known as: Adderall XR Take 2 capsules (40 mg total) by mouth daily. What changed: Another medication with the same name was changed. Make sure you understand how and when to take each. Changed by: February 27, 2021 Chelby Salata, MD   amphetamine-dextroamphetamine 20 MG 24 hr capsule Commonly known as: Adderall XR Take 2 capsules (40 mg total) by mouth daily. Start taking on: March 27, 2021 What changed: These instructions start on March 27, 2021. If you are unsure what to do until then, ask your doctor or other care provider. Changed by: March 29, 2021 Kasheena Sambrano, MD   amphetamine-dextroamphetamine 20 MG 24 hr capsule Commonly known as: Adderall XR Take 2 capsules (40 mg total) by mouth daily. Start taking on: April 25, 2021 What changed: These instructions start on April 25, 2021. If you are unsure what to do until then, ask  your doctor or other care provider. Changed by: Elige Radon Shatonya Passon, MD   ARIPiprazole 10 MG tablet Commonly known as: ABILIFY Take 1 tablet (10 mg total) by mouth daily.   atorvastatin 40 MG tablet Commonly known as: LIPITOR Take 1 tablet (40 mg total) by mouth at bedtime.   Benlysta 200 MG/ML Soaj Generic drug: Belimumab Inject 200 mg into the skin once a week.   cholecalciferol 1000 units tablet Commonly known as: VITAMIN D Take 1,000  Units by mouth daily.   FLUoxetine 20 MG capsule Commonly known as: PROZAC Take 1 capsule by mouth daily.   hydroxychloroquine 200 MG tablet Commonly known as: PLAQUENIL Take 1 & 1/2 tablets (300 mg total) by mouth daily.   levothyroxine 175 MCG tablet Commonly known as: SYNTHROID Take 1 tablet (175 mcg total) by mouth daily before breakfast.   medroxyPROGESTERone 150 MG/ML injection Commonly known as: Depo-Provera Inject 1 mL (150 mg total) into the muscle every 3 (three) months.   varenicline 0.5 MG X 11 & 1 MG X 42 tablet Commonly known as: Chantix Starting Month Pak Take one 0.5 mg tablet by mouth daily for 3 days, then one 0.5 mg tablet twice daily for 4 days, then one 1 mg tablet twice daily. Started by: Nils Pyle, MD   varenicline 1 MG tablet Commonly known as: Chantix Continuing Month Pak Take 1 tablet (1 mg total) by mouth 2 (two) times daily. Started by: Elige Radon Adarian Bur, MD         Objective:   BP 106/65   Pulse 76   Ht 5\' 7"  (1.702 m)   Wt 163 lb (73.9 kg)   SpO2 99%   BMI 25.53 kg/m   Wt Readings from Last 3 Encounters:  02/25/21 163 lb (73.9 kg)  12/30/20 157 lb 3.2 oz (71.3 kg)  12/08/20 159 lb 3.2 oz (72.2 kg)    Physical Exam Vitals and nursing note reviewed.  Constitutional:      General: She is not in acute distress.    Appearance: She is well-developed. She is not diaphoretic.  Eyes:     Conjunctiva/sclera: Conjunctivae normal.  Cardiovascular:     Rate and Rhythm: Normal rate and regular rhythm.     Heart sounds: Normal heart sounds. No murmur heard. Pulmonary:     Effort: Pulmonary effort is normal. No respiratory distress.     Breath sounds: Normal breath sounds. No wheezing.  Musculoskeletal:        General: No tenderness. Normal range of motion.  Skin:    General: Skin is warm and dry.     Findings: No rash.  Neurological:     Mental Status: She is alert and oriented to person, place, and time.     Coordination:  Coordination normal.  Psychiatric:        Behavior: Behavior normal.      Assessment & Plan:   Problem List Items Addressed This Visit       Endocrine   Hypothyroidism   Relevant Orders   TSH     Other   Adult ADHD - Primary   Relevant Medications   amphetamine-dextroamphetamine (ADDERALL XR) 20 MG 24 hr capsule   amphetamine-dextroamphetamine (ADDERALL XR) 20 MG 24 hr capsule (Start on 04/25/2021)   amphetamine-dextroamphetamine (ADDERALL XR) 20 MG 24 hr capsule (Start on 03/27/2021)   Other Relevant Orders   ToxASSURE Select 13 (MW), Urine   Other Visit Diagnoses     Encounter for smoking cessation counseling  Relevant Medications   varenicline (CHANTIX STARTING MONTH PAK) 0.5 MG X 11 & 1 MG X 42 tablet   varenicline (CHANTIX CONTINUING MONTH PAK) 1 MG tablet   Encounter for surveillance of injectable contraceptive       Relevant Medications   medroxyPROGESTERone (DEPO-PROVERA) injection 150 mg (Completed)       Sent Chantix for her otherwise continue current medicines.  We will check thyroid levels Follow up plan: No follow-ups on file.  Counseling provided for all of the vaccine components Orders Placed This Encounter  Procedures   TSH   ToxASSURE Select 13 (MW), Urine     Arville Care, MD [facility] 02/25/2021, 4:36 PM

## 2021-02-26 LAB — TSH: TSH: 26.9 u[IU]/mL — ABNORMAL HIGH (ref 0.450–4.500)

## 2021-03-03 LAB — TOXASSURE SELECT 13 (MW), URINE

## 2021-03-04 ENCOUNTER — Telehealth: Payer: Self-pay | Admitting: Family Medicine

## 2021-03-09 NOTE — Telephone Encounter (Signed)
Erroneous encounter,

## 2021-03-10 ENCOUNTER — Ambulatory Visit: Payer: Medicaid Other | Admitting: Internal Medicine

## 2021-03-11 ENCOUNTER — Encounter: Payer: Self-pay | Admitting: *Deleted

## 2021-03-16 ENCOUNTER — Other Ambulatory Visit (HOSPITAL_COMMUNITY): Payer: Self-pay

## 2021-03-22 ENCOUNTER — Other Ambulatory Visit (HOSPITAL_COMMUNITY): Payer: Self-pay

## 2021-03-22 NOTE — Progress Notes (Signed)
Office Visit Note  Patient: Valerie Ayala             Date of Birth: [birth year]           MRN: [id number]             PCP: Dettinger, Fransisca Kaufmann, MD Referring: Dettinger, Fransisca Kaufmann, MD Visit Date: 03/23/2021   Subjective:  Lupus (Doing good, patient has not had COVID vaccine.)   History of Present Illness: Valerie Ayala is a 35 y.o. female here for follow up for SLE on HCQ 300 mg PO daily and newly started Benlysta 200 mg [place] weeky since last month. Ongoing problems before starting included fatigue, lower extremity swelling, ulcers, joint pain, and proteinuria. She feels symptoms are noticeably improving after starting Benlysta, mostly in the past 2 weeks. Leg swelling is decreased, ulcers are cleared, and feels fatigue is somewhat less. No reaction or problems starting the medication.  Previous HPI 12/08/20 Valerie Ayala is a 35 y.o. female here for follow up for systemic lupus on hydroxychloroquine 300 mg p.o. daily. No signfiicant improvement since starting hydroxychloroquine 8 weeks ago.  She has not yet seen ophthalmology for retinal exam but plans to do so. Leg swelling continues to come and go, residual erythema and pain and burning sensations.  She continues to feel extremely fatigued as the most noticeable symptom.  She is also had a few mouth sores headaches and swollen glands but also describes the ongoing leg swelling sometimes with pain and discoloration.  Currently her left distal leg is most affected today.   Lupus manifestations Fatigue Arthralgias Photosensitive rash Oral ulcers Raynaud's   Lupus serology ANA 1:80 speckled dsDNA 49 Complement C3, C4 wnl Vit D 19   Review of Systems  Constitutional:  Negative for fatigue.  HENT:  Positive for mouth dryness.   Eyes:  Positive for dryness.  Respiratory:  Negative for shortness of breath.   Cardiovascular:  Negative for swelling in legs/feet.  Gastrointestinal:  Positive for constipation.  Endocrine: Positive for cold  intolerance, excessive thirst and increased urination.  Genitourinary:  Negative for difficulty urinating.  Musculoskeletal:  Positive for joint pain, gait problem, joint pain, joint swelling, morning stiffness and muscle tenderness.  Skin:  Negative for rash.  Allergic/Immunologic: Positive for susceptible to infections.  Neurological:  Positive for numbness.  Hematological:  Positive for bruising/bleeding tendency.  Psychiatric/Behavioral:  Negative for sleep disturbance.    PMFS History:  Patient Active Problem List   Diagnosis Date Noted   Swelling of lower extremity 12/08/2020   High risk medication use 12/08/2020   Hyperlipidemia 12/03/2020   Hypocalcemia 09/07/2020   Vitamin D deficiency 09/07/2020   Systemic lupus ([place]) 08/21/2020   Dry mouth 08/21/2020   Tobacco use disorder 08/21/2020   Arthralgia 08/21/2020   Raynaud's phenomenon 08/21/2020   Major depression, recurrent ([place]) 04/26/2018   Adult ADHD 12/16/2014   Asthma 05/07/2013   Hypothyroidism 11/26/2012   GAD (generalized anxiety disorder) 11/26/2012    Past Medical History:  Diagnosis Date   Anxiety    Takes xanax twice daily   Asthma    Chronic headaches    "multiple times a day; they say it's related to thyroid"   Complication of anesthesia    Dysrhythmia    tachycardia   Grave's disease    H/O seasonal allergies    Headache(784.0)    most days   Pneumonia 11/02/2011   "years ago"   PONV (postoperative nausea and vomiting)  Systemic lupus erythematosus ([place])    Thyroid disease     Family History  Problem Relation Age of Onset   Hypertension Mother    Hyperlipidemia Mother    Diabetes Mother    Hypertension Father    Hyperlipidemia Father    Hypothyroidism Father    Healthy Sister    Anesthesia problems Neg Hx    Hypotension Neg Hx    Malignant hyperthermia Neg Hx    Pseudochol deficiency Neg Hx    Past Surgical History:  Procedure Laterality Date   THYROIDECTOMY  11/02/2011    Procedure: THYROIDECTOMY;  Surgeon: Izora Gala, MD;  Location: [place];  Service: ENT;  Laterality: Bilateral;  TOTAL THYROIDECTOMY   TOTAL THYROIDECTOMY  11/02/11   WISDOM TOOTH EXTRACTION  2004   Social History   Social History Narrative   Not on file   Immunization History  Administered Date(s) Administered   DTaP 08/24/1984, 11/05/1984, 12/31/1984, 12/24/1985   Hepatitis B 04/26/1996, 05/31/1996, 08/30/1996   IPV 08/24/1984, 11/05/1984, 12/24/1985   Influenza,inj,Quad PF,6+ Mos 05/25/2020   Influenza-Unspecified 06/06/2011, 04/25/2012   MMR 12/24/1985   PPD Test 03/16/2015, 03/23/2015   Pneumococcal Polysaccharide-23 11/03/2011   Tdap 12/10/2010     Objective: Vital Signs: BP 97/61 (BP Location: Left Arm, Patient Position: Sitting, Cuff Size: Normal)   Pulse (!) 106   Resp 14   Ht 5' 7"  (1.702 m)   Wt 164 lb (74.4 kg)   BMI 25.69 kg/m    Physical Exam HENT:     Mouth/Throat:     Mouth: Mucous membranes are moist.     Pharynx: Oropharynx is clear.  Cardiovascular:     Rate and Rhythm: Regular rhythm. Tachycardia present.  Pulmonary:     Effort: Pulmonary effort is normal.     Breath sounds: Normal breath sounds.  Skin:    General: Skin is warm and dry.     Findings: No rash.  Neurological:     Mental Status: She is alert.  Psychiatric:        Mood and Affect: Mood normal.     Musculoskeletal Exam:  Shoulders full ROM no tenderness or swelling Elbows full ROM no tenderness or swelling Wrists full ROM no tenderness or swelling Knees full ROM no tenderness or swelling   Investigation: No additional findings.  Imaging: No results found.  Recent Labs: Lab Results  Component Value Date   WBC 8.3 03/23/2021   HGB 15.3 03/23/2021   PLT 303 03/23/2021   NA 141 03/23/2021   K 3.8 03/23/2021   CL 105 03/23/2021   CO2 25 03/23/2021   GLUCOSE 63 (L) 03/23/2021   BUN 9 03/23/2021   CREATININE 0.91 03/23/2021   BILITOT 0.9 03/23/2021   ALKPHOS 53  12/03/2020   AST 12 03/23/2021   ALT 11 03/23/2021   PROT 7.0 03/23/2021   ALBUMIN 4.2 12/03/2020   CALCIUM 8.4 (L) 03/23/2021   GFRAA 80 02/27/2020   QFTBGOLDPLUS NEGATIVE 12/08/2020    Speciality Comments: PLQ eye exam has not been done yet Benlysta started 01/28/21  Procedures:  No procedures performed Allergies: Patient has no known allergies.   Assessment / Plan:     Visit Diagnoses: Systemic lupus erythematosus, unspecified SLE type, unspecified organ involvement status ([place]) - Plan: Belimumab (BENLYSTA) 200 MG/ML SOAJ, Anti-DNA antibody, double-stranded  Improvement in symptoms reported since starting Benlysta although this is still very early. Checking dsDNA level for disease monitoring. Continue Benlysta 200 mg [place] weekly and hydroxychloroquine 300 mg PO  daily.  High risk medication use - Plan: CBC with Differential/Platelet, COMPLETE METABOLIC PANEL WITH GFR  After newly starting Benlysta needs monitoring will check CBC for any new cytopenia. No reported infections or reactions.  Raynaud's phenomenon without gangrene  Symptoms increasing with cooler weather, no lesions not interested in trial of additional medication at this time discussed core temperature protection and cold avoidance.  Orders: Orders Placed This Encounter  Procedures   CBC with Differential/Platelet   COMPLETE METABOLIC PANEL WITH GFR   Anti-DNA antibody, double-stranded    Meds ordered this encounter  Medications   hydroxychloroquine (PLAQUENIL) 200 MG tablet    Sig: Take 1.5 tablets (300 mg total) by mouth daily.    Dispense:  45 tablet    Refill:  2   Belimumab (BENLYSTA) 200 MG/ML SOAJ    Sig: Inject 200 mg into the skin once a week.    Dispense:  4 mL    Refill:  2    Deliver to patient's home      Follow-Up Instructions: Return in about 3 months (around 06/22/2021) for SLE on BEL+HCQ f/u 64mo.   CCollier Salina MD  Note - This record has been created using [employer]   Chart creation errors have been sought, but may not always  have been located. Such creation errors do not reflect on  the standard of medical care.

## 2021-03-23 ENCOUNTER — Other Ambulatory Visit: Payer: Self-pay

## 2021-03-23 ENCOUNTER — Encounter: Payer: Self-pay | Admitting: Internal Medicine

## 2021-03-23 ENCOUNTER — Ambulatory Visit: Payer: Medicaid Other | Admitting: Internal Medicine

## 2021-03-23 VITALS — BP 97/61 | HR 106 | Resp 14 | Ht 67.0 in | Wt 164.0 lb

## 2021-03-23 DIAGNOSIS — E559 Vitamin D deficiency, unspecified: Secondary | ICD-10-CM | POA: Diagnosis not present

## 2021-03-23 DIAGNOSIS — I7300 Raynaud's syndrome without gangrene: Secondary | ICD-10-CM | POA: Diagnosis not present

## 2021-03-23 DIAGNOSIS — R768 Other specified abnormal immunological findings in serum: Secondary | ICD-10-CM

## 2021-03-23 DIAGNOSIS — M329 Systemic lupus erythematosus, unspecified: Secondary | ICD-10-CM

## 2021-03-23 DIAGNOSIS — M2550 Pain in unspecified joint: Secondary | ICD-10-CM

## 2021-03-23 DIAGNOSIS — Z79899 Other long term (current) drug therapy: Secondary | ICD-10-CM

## 2021-03-23 MED ORDER — HYDROXYCHLOROQUINE SULFATE 200 MG PO TABS: 300.0000 mg | ORAL_TABLET | Freq: Every day | ORAL | 2 refills | Status: DC

## 2021-03-23 MED ORDER — BENLYSTA 200 MG/ML ~~LOC~~ SOAJ: 200.0000 mg | SUBCUTANEOUS | 2 refills | Status: DC

## 2021-03-24 LAB — CBC WITH DIFFERENTIAL/PLATELET
Absolute Monocytes: 481 cells/uL (ref 200–950)
Basophils Absolute: 108 cells/uL (ref 0–200)
Basophils Relative: 1.3 %
Eosinophils Absolute: 291 cells/uL (ref 15–500)
Eosinophils Relative: 3.5 %
HCT: 44.5 % (ref 35.0–45.0)
Hemoglobin: 15.3 g/dL (ref 11.7–15.5)
Lymphs Abs: 3428 cells/uL (ref 850–3900)
MCH: 31 pg (ref 27.0–33.0)
MCHC: 34.4 g/dL (ref 32.0–36.0)
MCV: 90.3 fL (ref 80.0–100.0)
MPV: 10.5 fL (ref 7.5–12.5)
Monocytes Relative: 5.8 %
Neutro Abs: 3992 cells/uL (ref 1500–7800)
Neutrophils Relative %: 48.1 %
Platelets: 303 10*3/uL (ref 140–400)
RBC: 4.93 10*6/uL (ref 3.80–5.10)
RDW: 12.7 % (ref 11.0–15.0)
Total Lymphocyte: 41.3 %
WBC: 8.3 10*3/uL (ref 3.8–10.8)

## 2021-03-24 LAB — COMPLETE METABOLIC PANEL WITH GFR
AG Ratio: 1.8 (calc) (ref 1.0–2.5)
ALT: 11 U/L (ref 6–29)
AST: 12 U/L (ref 10–30)
Albumin: 4.5 g/dL (ref 3.6–5.1)
Alkaline phosphatase (APISO): 54 U/L (ref 31–125)
BUN: 9 mg/dL (ref 7–25)
CO2: 25 mmol/L (ref 20–32)
Calcium: 8.4 mg/dL — ABNORMAL LOW (ref 8.6–10.2)
Chloride: 105 mmol/L (ref 98–110)
Creat: 0.91 mg/dL (ref 0.50–0.97)
Globulin: 2.5 g/dL (calc) (ref 1.9–3.7)
Glucose, Bld: 63 mg/dL — ABNORMAL LOW (ref 65–99)
Potassium: 3.8 mmol/L (ref 3.5–5.3)
Sodium: 141 mmol/L (ref 135–146)
Total Bilirubin: 0.9 mg/dL (ref 0.2–1.2)
Total Protein: 7 g/dL (ref 6.1–8.1)
eGFR: 84 mL/min/{1.73_m2} (ref >=60)

## 2021-03-24 LAB — ANTI-DNA ANTIBODY, DOUBLE-STRANDED: ds DNA Ab: 50 IU/mL — ABNORMAL HIGH

## 2021-03-25 NOTE — Progress Notes (Signed)
Results look fine for continuing the benlysta. The dsDNA antibody level is unchanged at 50 so maybe not correlating with her symptoms, but the proteinuria was improved. We can just keep an eye on these while continuing treatment for now.

## 2021-03-28 ENCOUNTER — Encounter: Payer: Self-pay | Admitting: Family Medicine

## 2021-03-29 ENCOUNTER — Encounter: Payer: Self-pay | Admitting: Nurse Practitioner

## 2021-03-29 ENCOUNTER — Other Ambulatory Visit: Payer: Self-pay

## 2021-03-29 ENCOUNTER — Ambulatory Visit: Payer: Medicaid Other | Admitting: Nurse Practitioner

## 2021-03-29 ENCOUNTER — Telehealth: Payer: Self-pay | Admitting: Family Medicine

## 2021-03-29 DIAGNOSIS — J988 Other specified respiratory disorders: Secondary | ICD-10-CM

## 2021-03-29 DIAGNOSIS — B9789 Other viral agents as the cause of diseases classified elsewhere: Secondary | ICD-10-CM

## 2021-03-29 MED ORDER — AMOXICILLIN-POT CLAVULANATE 875-125 MG PO TABS: 1.0000 | ORAL_TABLET | Freq: Two times a day (BID) | ORAL | 0 refills | Status: DC

## 2021-03-29 MED ORDER — LEVOTHYROXINE SODIUM 200 MCG PO TABS: 200.0000 ug | ORAL_TABLET | Freq: Every day | ORAL | 1 refills | Status: DC

## 2021-03-29 NOTE — Progress Notes (Addendum)
Virtual Visit  Note Due to COVID-19 pandemic this visit was conducted virtually. This visit type was conducted due to national recommendations for restrictions regarding the COVID-19 Pandemic (e.g. social distancing, sheltering in place) in an effort to limit this patient's exposure and mitigate transmission in our community. All issues noted in this document were discussed and addressed.  A physical exam was not performed with this format.  I connected with Valerie Ayala on 03/29/21 at 8:45 by telephone and verified that I am speaking with the correct person using two identifiers. Valerie Ayala is currently located at home and no one is currently with her during visit. The provider, Valerie Daphine Deutscher, FNP is located in their office at time of visit.  I discussed the limitations, risks, security and privacy concerns of performing an evaluation and management service by telephone and the availability of in person appointments. I also discussed with the patient that there may be a patient responsible charge related to this service. The patient expressed understanding and agreed to proceed.   History and Present Illness:  Patient states taht 3 days ago she developed congestion and ear pain. As the days have gone in she has a productive cough with fever of 101. Sh has been taking mucinex and is not helped. She has not tested for covid.    Review of Systems  Constitutional:  Positive for chills, fever and malaise/fatigue.  HENT:  Positive for congestion and sore throat.   Respiratory:  Positive for cough.   Musculoskeletal:  Positive for myalgias.  Neurological:  Positive for headaches.    Observations/Objective: Alert and oriented- answers all questions appropriately No distress Voice hoarse. Wet cough  Assessment and Plan: [name] in today with chief complaint of No chief complaint on file.   1. Viral respiratory illness 1. Take meds as prescribed 2. Use a cool mist humidifier  especially during the winter months and when heat has been humid. 3. Use saline nose sprays frequently 4. Saline irrigations of the nose can be very helpful if done frequently.  * 4X daily for 1 week*  * Use of a nettie pot can be helpful with this. Follow directions with this* 5. Drink plenty of fluids 6. Keep thermostat turn down low 7.For any cough or congestion  Use plain Mucinex- regular strength or max strength is fine   * Children- consult with Pharmacist for dosing 8. For fever or aces or pains- take tylenol or ibuprofen appropriate for age and weight.  * for fevers greater than 101 orally you may alternate ibuprofen and tylenol every  3 hours.   Patient will call back with covid results      Follow Up Instructions: prn    I discussed the assessment and treatment plan with the patient. The patient was provided an opportunity to ask questions and all were answered. The patient agreed with the plan and demonstrated an understanding of the instructions.   The patient was advised to call back or seek an in-person evaluation if the symptoms worsen or if the condition fails to improve as anticipated.  The above assessment and management plan was discussed with the patient. The patient verbalized understanding of and has agreed to the management plan. Patient is aware to call the clinic if symptoms persist or worsen. Patient is aware when to return to the clinic for a follow-up visit. Patient educated on when it is appropriate to go to the emergency department.   Time call ended:  8:59  I provided 13 minutes of  non face-to-face time during this encounter.    Valerie Daphine Deutscher, FNP   Addendum- covid test negative-  Meds ordered this encounter  Medications   amoxicillin-clavulanate (AUGMENTIN) 875-125 MG tablet    Sig: Take 1 tablet by mouth 2 (two) times daily.    Dispense:  20 tablet    Refill:  0    Order Specific Question:   Supervising Provider    Answer:    Arville Care A [id number]

## 2021-03-29 NOTE — Addendum Note (Signed)
Addended by: Bennie Pierini on: 03/29/2021 10:16 AM   Modules accepted: Orders

## 2021-03-29 NOTE — Telephone Encounter (Signed)
Levothyroxine 200 mcg sent to [facility]. #90 1 refill per [place].

## 2021-03-31 ENCOUNTER — Telehealth: Payer: Self-pay | Admitting: Family Medicine

## 2021-03-31 NOTE — Telephone Encounter (Signed)
Nothing can do for ear pian. Can try OTC decongestant and motrin or tylenol Numbing drops for ear are no longer available

## 2021-03-31 NOTE — Telephone Encounter (Signed)
Pt made aware and understood 

## 2021-04-14 ENCOUNTER — Other Ambulatory Visit (HOSPITAL_COMMUNITY): Payer: Self-pay

## 2021-04-16 ENCOUNTER — Other Ambulatory Visit: Payer: Self-pay | Admitting: Internal Medicine

## 2021-04-16 ENCOUNTER — Other Ambulatory Visit (HOSPITAL_COMMUNITY): Payer: Self-pay

## 2021-04-16 DIAGNOSIS — M329 Systemic lupus erythematosus, unspecified: Secondary | ICD-10-CM

## 2021-04-16 MED ORDER — BENLYSTA 200 MG/ML ~~LOC~~ SOAJ
200.0000 mg | SUBCUTANEOUS | 1 refills | Status: DC
Start: 2021-04-16 — End: 2021-06-09
  Filled 2021-04-16: qty 4, 28d supply, fill #0
  Filled 2021-05-13: qty 4, 28d supply, fill #1

## 2021-04-16 NOTE — Telephone Encounter (Signed)
Next Visit: 06/24/2021  Last Visit: 03/23/2021  BR:AXENMMHW lupus erythematosus, unspecified SLE type, unspecified organ involvement status   Current Dose per office note 03/23/2021: Benlysta 200 mg [place] weekly   Labs: 03/23/2021 Results look fine for continuing the benlysta.  TB Gold: 12/08/2020 Neg    Okay to refill Benlysta?

## 2021-04-20 ENCOUNTER — Other Ambulatory Visit (HOSPITAL_COMMUNITY): Payer: Self-pay

## 2021-04-21 ENCOUNTER — Other Ambulatory Visit (HOSPITAL_COMMUNITY): Payer: Self-pay

## 2021-04-29 ENCOUNTER — Other Ambulatory Visit: Payer: Self-pay | Admitting: Family Medicine

## 2021-04-29 DIAGNOSIS — Z716 Tobacco abuse counseling: Secondary | ICD-10-CM

## 2021-05-13 ENCOUNTER — Other Ambulatory Visit (HOSPITAL_COMMUNITY): Payer: Self-pay

## 2021-05-17 ENCOUNTER — Other Ambulatory Visit (HOSPITAL_COMMUNITY): Payer: Self-pay

## 2021-05-26 ENCOUNTER — Other Ambulatory Visit: Payer: Self-pay | Admitting: Family Medicine

## 2021-05-26 ENCOUNTER — Other Ambulatory Visit: Payer: Self-pay | Admitting: Internal Medicine

## 2021-05-26 DIAGNOSIS — M2550 Pain in unspecified joint: Secondary | ICD-10-CM

## 2021-05-26 DIAGNOSIS — Z3009 Encounter for other general counseling and advice on contraception: Secondary | ICD-10-CM

## 2021-05-26 DIAGNOSIS — R768 Other specified abnormal immunological findings in serum: Secondary | ICD-10-CM

## 2021-05-27 ENCOUNTER — Ambulatory Visit (INDEPENDENT_AMBULATORY_CARE_PROVIDER_SITE_OTHER): Payer: Medicaid Other | Admitting: Family Medicine

## 2021-05-27 ENCOUNTER — Encounter: Payer: Self-pay | Admitting: Family Medicine

## 2021-05-27 VITALS — BP 129/85 | HR 120 | Ht 67.0 in | Wt 179.0 lb

## 2021-05-27 DIAGNOSIS — Z308 Encounter for other contraceptive management: Secondary | ICD-10-CM

## 2021-05-27 DIAGNOSIS — F331 Major depressive disorder, recurrent, moderate: Secondary | ICD-10-CM | POA: Diagnosis not present

## 2021-05-27 DIAGNOSIS — Z23 Encounter for immunization: Secondary | ICD-10-CM | POA: Diagnosis not present

## 2021-05-27 DIAGNOSIS — F909 Attention-deficit hyperactivity disorder, unspecified type: Secondary | ICD-10-CM | POA: Diagnosis not present

## 2021-05-27 DIAGNOSIS — Z3042 Encounter for surveillance of injectable contraceptive: Secondary | ICD-10-CM

## 2021-05-27 DIAGNOSIS — E890 Postprocedural hypothyroidism: Secondary | ICD-10-CM | POA: Diagnosis not present

## 2021-05-27 DIAGNOSIS — E663 Overweight: Secondary | ICD-10-CM

## 2021-05-27 DIAGNOSIS — F411 Generalized anxiety disorder: Secondary | ICD-10-CM

## 2021-05-27 DIAGNOSIS — E782 Mixed hyperlipidemia: Secondary | ICD-10-CM

## 2021-05-27 MED ORDER — BREXPIPRAZOLE 0.25 MG PO TABS: 0.2500 mg | ORAL_TABLET | Freq: Every day | ORAL | 0 refills | Status: DC

## 2021-05-27 MED ORDER — FLUOXETINE HCL 20 MG PO CAPS: 20.0000 mg | ORAL_CAPSULE | Freq: Every day | ORAL | 3 refills | Status: DC

## 2021-05-27 MED ORDER — ATORVASTATIN CALCIUM 40 MG PO TABS: 40.0000 mg | ORAL_TABLET | Freq: Every evening | ORAL | 3 refills | Status: DC

## 2021-05-27 MED ORDER — SAXENDA 18 MG/3ML ~~LOC~~ SOPN: PEN_INJECTOR | SUBCUTANEOUS | 1 refills | Status: AC

## 2021-05-27 MED ORDER — MEDROXYPROGESTERONE ACETATE 150 MG/ML IM SUSP
150.0000 mg | Freq: Once | INTRAMUSCULAR | Status: AC
  Administered 2021-05-27: 150 mg via INTRAMUSCULAR

## 2021-05-27 MED ORDER — REXULTI 0.5 MG PO TABS: 0.5000 mg | ORAL_TABLET | Freq: Every day | ORAL | 2 refills | Status: DC

## 2021-05-27 NOTE — Progress Notes (Signed)
BP 129/85   Pulse (!) 120   Ht 5' 7"  (1.702 m)   Wt 179 lb (81.2 kg)   SpO2 100%   BMI 28.04 kg/m    Subjective:   Patient ID: Valerie Ayala, female    DOB: Jun 06, 1986, 35 y.o.   MRN: [id number]  HPI: Valerie Ayala is a 35 y.o. female presenting on 05/27/2021 for Medical Management of Chronic Issues and ADHD   HPI Patient feels like she is gaining her weight and the family member who is trying [place] and she would like to try it herself.  A lot of the weight has been getting since her thyroids been off and she is having trouble losing it back since the adjustment of the dose.  Hypothyroidism recheck Patient is coming in for thyroid recheck today as well. They deny any issues with hair changes or heat or cold problems or diarrhea or constipation. They deny any chest pain or palpitations. They are currently on levothyroxine 200 micrograms   Anxiety and ADHD and depression Patient failed drug screen so we are going to stop prescribing the ADHD medicine.  She says the Abilify has worked for her but she feels like it is fading and has heard about Rexulti and would like to try that.  Patient just feels like her anxiety started to build back up and she has a little more depression as well.  Patient denies any suicidal ideations Depression screen [facility] 2/9 05/27/2021 02/25/2021 12/03/2020 08/26/2020 05/25/2020  Decreased Interest 1 1 1 1 1   Down, Depressed, Hopeless 1 1 1  0 0  PHQ - 2 Score 2 2 2 1 1   Altered sleeping 3 1 3  - -  Tired, decreased energy 3 3 3  - -  Change in appetite 2 0 0 - -  Feeling bad or failure about yourself  1 1 0 - -  Trouble concentrating 2 0 1 - -  Moving slowly or fidgety/restless 0 0 0 - -  Suicidal thoughts 0 0 0 - -  PHQ-9 Score 13 7 9  - -  Some recent data might be hidden     Relevant past medical, surgical, family and social history reviewed and updated as indicated. Interim medical history since our last visit reviewed. Allergies and medications reviewed and  updated.  Review of Systems  Constitutional:  Negative for chills and fever.  Eyes:  Negative for visual disturbance.  Respiratory:  Negative for chest tightness and shortness of breath.   Cardiovascular:  Negative for chest pain and leg swelling.  Musculoskeletal:  Negative for back pain and gait problem.  Skin:  Negative for rash.  Neurological:  Negative for dizziness, light-headedness and headaches.  Psychiatric/Behavioral:  Positive for dysphoric mood and sleep disturbance. Negative for agitation, behavioral problems, self-injury and suicidal ideas. The patient is nervous/anxious.   All other systems reviewed and are negative.  Per HPI unless specifically indicated above   Allergies as of 05/27/2021   No Known Allergies      Medication List        Accurate as of May 27, 2021  2:13 PM. If you have any questions, ask your nurse or doctor.          STOP taking these medications    amoxicillin-clavulanate 875-125 MG tablet Commonly known as: AUGMENTIN Stopped by: Fransisca Kaufmann Kenadi Miltner, MD   amphetamine-dextroamphetamine 20 MG 24 hr capsule Commonly known as: Adderall XR Stopped by: Worthy Rancher, MD   ARIPiprazole 10 MG tablet  Commonly known as: ABILIFY Stopped by: Worthy Rancher, MD   varenicline 0.5 MG tablet Commonly known as: CHANTIX Stopped by: Fransisca Kaufmann Keshanna Riso, MD   varenicline 0.5 MG X 11 & 1 MG X 42 tablet Commonly known as: Chantix [employer] by: Worthy Rancher, MD   varenicline 1 MG tablet Commonly known as: CHANTIX Stopped by: Worthy Rancher, MD       TAKE these medications    albuterol 108 (90 Base) MCG/ACT inhaler Commonly known as: VENTOLIN HFA Inhale 2 puffs into the lungs every 6 (six) hours as needed. For shortness of breath   atorvastatin 40 MG tablet Commonly known as: LIPITOR Take 1 tablet (40 mg total) by mouth at bedtime.   Benlysta 200 MG/ML [place] Generic drug: Belimumab Inject 200 mg  into the skin once a week.   brexpiprazole 0.25 MG Tabs tablet Commonly known as: Rexulti Take 1 tablet (0.25 mg total) by mouth daily. Started by: Fransisca Kaufmann Lamika Connolly, MD   Rexulti 0.5 MG Tabs Generic drug: Brexpiprazole Take 1 tablet (0.5 mg total) by mouth daily at 12 noon. Started by: Worthy Rancher, MD   cholecalciferol 1000 units tablet Commonly known as: VITAMIN D Take 1,000 Units by mouth daily.   FLUoxetine 20 MG capsule Commonly known as: PROZAC Take 1 capsule (20 mg total) by mouth daily.   hydroxychloroquine 200 MG tablet Commonly known as: PLAQUENIL Take 1.5 tablets (300 mg total) by mouth daily.   levothyroxine 200 MCG tablet Commonly known as: SYNTHROID Take 1 tablet (200 mcg total) by mouth daily.   medroxyPROGESTERone 150 MG/ML injection Commonly known as: DEPO-PROVERA Inject 1 mL (150 mg total) into the muscle every 3 (three) months.   Saxenda 18 MG/3ML Sopn Generic drug: Liraglutide -Weight Management Inject 0.6 mg into the skin daily in the afternoon for 7 days, THEN 1.2 mg daily in the afternoon for 7 days, THEN 1.8 mg daily in the afternoon for 7 days, THEN 2.4 mg daily in the afternoon for 7 days, THEN 3 mg daily in the afternoon. Start taking on: May 27, 2021 Started by: Fransisca Kaufmann Kalai Baca, MD         Objective:   BP 129/85   Pulse (!) 120   Ht 5' 7"  (1.702 m)   Wt 179 lb (81.2 kg)   SpO2 100%   BMI 28.04 kg/m   Wt Readings from Last 3 Encounters:  05/27/21 179 lb (81.2 kg)  03/23/21 164 lb (74.4 kg)  02/25/21 163 lb (73.9 kg)    Physical Exam Vitals and nursing note reviewed.  Constitutional:      General: She is not in acute distress.    Appearance: She is well-developed. She is not diaphoretic.  Eyes:     Conjunctiva/sclera: Conjunctivae normal.  Cardiovascular:     Rate and Rhythm: Normal rate and regular rhythm.     Heart sounds: Normal heart sounds. No murmur heard. Pulmonary:     Effort: Pulmonary effort is  normal. No respiratory distress.     Breath sounds: Normal breath sounds. No wheezing.  Musculoskeletal:        General: No swelling or tenderness. Normal range of motion.  Skin:    General: Skin is warm and dry.     Findings: No rash.  Neurological:     Mental Status: She is alert and oriented to person, place, and time.     Coordination: Coordination normal.  Psychiatric:  Behavior: Behavior normal.      Assessment & Plan:   Problem List Items Addressed This Visit       Endocrine   Hypothyroidism   Relevant Medications   Liraglutide -Weight Management (SAXENDA) 18 MG/3ML SOPN   Other Relevant Orders   TSH     Other   GAD (generalized anxiety disorder)   Relevant Medications   FLUoxetine (PROZAC) 20 MG capsule   brexpiprazole (REXULTI) 0.25 MG TABS tablet   Brexpiprazole (REXULTI) 0.5 MG TABS   Adult ADHD - Primary   Major depression, recurrent (HCC)   Relevant Medications   FLUoxetine (PROZAC) 20 MG capsule   Hyperlipidemia   Relevant Medications   atorvastatin (LIPITOR) 40 MG tablet   Liraglutide -Weight Management (SAXENDA) 18 MG/3ML SOPN   Other Relevant Orders   CMP14+EGFR   Lipid panel   Other Visit Diagnoses     Encounter for other contraceptive management       Relevant Medications   medroxyPROGESTERone (DEPO-PROVERA) injection 150 mg (Completed)   Overweight (BMI 25.0-29.9)       Relevant Medications   Liraglutide -Weight Management (SAXENDA) 18 MG/3ML SOPN       Patient was informed again about the necessity for her Pap smear because she has not had 1 since her abnormal Pap smear in 4 years ago  Patient will switch from Abilify to [place] and we will discontinue the Adderall.  Recheck thyroid levels today. Follow up plan: Return if symptoms worsen or fail to improve, for 1 to 76-monthrecheck anxiety depression.  Counseling provided for all of the vaccine components Orders Placed This Encounter  Procedures   CMP14+EGFR   Lipid panel    TSH    JCaryl Pina MD [facility] 05/27/2021, 2:13 PM

## 2021-05-28 LAB — CMP14+EGFR
ALT: 10 IU/L (ref 0–32)
AST: 17 IU/L (ref 0–40)
Albumin/Globulin Ratio: 1.8 (ref 1.2–2.2)
Albumin: 4.4 g/dL (ref 3.8–4.8)
Alkaline Phosphatase: 73 IU/L (ref 44–121)
BUN/Creatinine Ratio: 13 (ref 9–23)
BUN: 13 mg/dL (ref 6–20)
Bilirubin Total: 0.3 mg/dL (ref 0.0–1.2)
CO2: 24 mmol/L (ref 20–29)
Calcium: 8 mg/dL — ABNORMAL LOW (ref 8.7–10.2)
Chloride: 98 mmol/L (ref 96–106)
Creatinine, Ser: 0.98 mg/dL (ref 0.57–1.00)
Globulin, Total: 2.5 g/dL (ref 1.5–4.5)
Glucose: 82 mg/dL (ref 70–99)
Potassium: 4 mmol/L (ref 3.5–5.2)
Sodium: 138 mmol/L (ref 134–144)
Total Protein: 6.9 g/dL (ref 6.0–8.5)
eGFR: 77 mL/min/{1.73_m2} (ref >59)

## 2021-05-28 LAB — LIPID PANEL
Chol/HDL Ratio: 5.1 ratio — ABNORMAL HIGH (ref 0.0–4.4)
Cholesterol, Total: 179 mg/dL (ref 100–199)
HDL: 35 mg/dL — ABNORMAL LOW (ref >39)
LDL Chol Calc (NIH): 116 mg/dL — ABNORMAL HIGH (ref 0–99)
Triglycerides: 159 mg/dL — ABNORMAL HIGH (ref 0–149)
VLDL Cholesterol Cal: 28 mg/dL (ref 5–40)

## 2021-05-28 LAB — TSH: TSH: 0.052 u[IU]/mL — ABNORMAL LOW (ref 0.450–4.500)

## 2021-06-01 ENCOUNTER — Telehealth: Payer: Self-pay

## 2021-06-01 NOTE — Telephone Encounter (Signed)
(  Key: [id number])  Your information has been sent to [facility].  Dx: BMI greater than 28, Hyperlipidemia

## 2021-06-01 NOTE — Telephone Encounter (Signed)
PA Case: [id number], Status: Denied. Notification: Completed.

## 2021-06-01 NOTE — Telephone Encounter (Signed)
[facility]

## 2021-06-01 NOTE — Telephone Encounter (Signed)
Message from Plan PA Case: [id number], Status: Approved, Coverage Starts on: 06/01/2021 12:00:00 AM, Coverage Ends on: 06/01/2022 12:00:00 AM.  Pharm aware

## 2021-06-01 NOTE — Telephone Encounter (Signed)
(  Key: [id number])  Your information has been sent to [facility].  Dx: Depression , GAD, ADHD  Tried and failed Abilify,Wellbutrin, Cymbalta, Hydroxyzine, Vyvanse

## 2021-06-01 NOTE — Telephone Encounter (Signed)
This request has received a Favorable outcome.  Please note any additional information provided by [facility] [employer] [place] [employer] at the bottom of this request. 

## 2021-06-02 NOTE — Telephone Encounter (Signed)
Pt has been informed and understood. 

## 2021-06-02 NOTE — Telephone Encounter (Signed)
Please let the patient know that it looks like her insurance company rejected the Saxenda, things are always changing and her plan may change next year so we can always try again later

## 2021-06-03 ENCOUNTER — Telehealth: Payer: Self-pay | Admitting: Family Medicine

## 2021-06-03 DIAGNOSIS — F411 Generalized anxiety disorder: Secondary | ICD-10-CM

## 2021-06-03 MED ORDER — BREXPIPRAZOLE 0.25 MG PO TABS: ORAL_TABLET | ORAL | 0 refills | Status: AC

## 2021-06-03 NOTE — Telephone Encounter (Signed)
I sent the medicine for her, hopefully this will work with insurance coverage.

## 2021-06-03 NOTE — Telephone Encounter (Signed)
April called from [facility] stating that she received both Rx's for Rexult but says this medicine is extremely expensive so she is not going to fill the 0.25 Rx for 7 pills because the bottle comes with 30 so that means she will have to eat the cost of the 23 other pills potentially and doesn't want to do that.  Wants to know if provider can prescribe the 0.25mg  as 30 count with directions for pt to take 1x per day for 7 days, then 2x per day until.  Please advise.

## 2021-06-04 MED ORDER — LEVOTHYROXINE SODIUM 175 MCG PO TABS: 175.0000 ug | ORAL_TABLET | Freq: Every day | ORAL | 3 refills | Status: DC

## 2021-06-04 NOTE — Addendum Note (Signed)
Addended by: Austin Miles F on: 06/04/2021 01:55 PM   Modules accepted: Orders

## 2021-06-09 ENCOUNTER — Other Ambulatory Visit: Payer: Self-pay | Admitting: Internal Medicine

## 2021-06-09 ENCOUNTER — Encounter: Payer: Self-pay | Admitting: *Deleted

## 2021-06-09 ENCOUNTER — Other Ambulatory Visit (HOSPITAL_COMMUNITY): Payer: Self-pay

## 2021-06-09 DIAGNOSIS — M329 Systemic lupus erythematosus, unspecified: Secondary | ICD-10-CM

## 2021-06-09 MED ORDER — BENLYSTA 200 MG/ML ~~LOC~~ SOAJ
200.0000 mg | SUBCUTANEOUS | 1 refills | Status: DC
  Filled 2021-06-09: qty 4, 28d supply, fill #0
  Filled 2021-07-05: qty 4, 28d supply, fill #1

## 2021-06-09 NOTE — Telephone Encounter (Signed)
I won't be in the office 12/29 so that appointment needs rescheduling next available that works for her in January probably, okay to refill Benlysta.

## 2021-06-09 NOTE — Telephone Encounter (Signed)
Attempted to contact the patient and left message for patient to call the office. Also sent a my chart message asking patient to call the office to reschedule her appointment for 06/24/2021.

## 2021-06-09 NOTE — Telephone Encounter (Signed)
Next Visit: 06/24/2021  Last Visit: 03/23/2021  Last Fill: 04/16/2021  VP:XTGGYIRS lupus erythematosus, unspecified SLE type, unspecified organ involvement status  Current Dose per office note 03/23/2021: Benlysta 200 mg [place] weekly  Labs: 03/23/2021 Results look fine for continuing the benlysta.   TB Gold: 12/08/2020 Neg   Okay to refill Benlysta?

## 2021-06-14 ENCOUNTER — Telehealth: Payer: Self-pay | Admitting: Internal Medicine

## 2021-06-14 ENCOUNTER — Other Ambulatory Visit (HOSPITAL_COMMUNITY): Payer: Self-pay

## 2021-06-14 NOTE — Telephone Encounter (Signed)
Patient called the office stating she was returning Andrea's call. [phone]

## 2021-06-14 NOTE — Progress Notes (Signed)
Office Visit Note  Patient: Valerie Ayala             Date of Birth: 1986-06-21           MRN: [id number]             PCP: Dettinger, Fransisca Kaufmann, MD Referring: Dettinger, Fransisca Kaufmann, MD Visit Date: 06/15/2021   Subjective:  Follow-up (Bl lower leg swelling)   History of Present Illness: Valerie Ayala is a 35 y.o. female here for follow up for SLE on HCQ 300 mg daily and Benlysta 200 mg [place] weekly.  She was doing very well after our last visit until about a month ago now feels everything is horrible.  She has significant bilateral leg swelling with pitting edema developing increased tenderness and painful burning sensation with discolorations and skin changes in both legs.  She feels very fatigued.  Some skin dryness and peeling on the hands but no significant erythematous rash outside of the lower legs.  She recently also had flu with generalized body pains prescribed very large anterior cervical adenopathy.  This improved though she still has a residual ulcer in the mouth. Labs earlier this month showing very low TSH level with recent slight downward adjustment of her thyroid replacement.  She quit smoking with use of Chantix.  Previous HPI 03/23/21 Valerie Ayala is a 35 y.o. female here for follow up for SLE on HCQ 300 mg PO daily and newly started Benlysta 200 mg [place] weeky since last month. Ongoing problems before starting included fatigue, lower extremity swelling, ulcers, joint pain, and proteinuria. She feels symptoms are noticeably improving after starting Benlysta, mostly in the past 2 weeks. Leg swelling is decreased, ulcers are cleared, and feels fatigue is somewhat less. No reaction or problems starting the medication.   Previous HPI 12/08/20 Valerie Ayala is a 35 y.o. female here for follow up for systemic lupus on hydroxychloroquine 300 mg p.o. daily. No signfiicant improvement since starting hydroxychloroquine 8 weeks ago.  She has not yet seen ophthalmology for retinal exam but plans to do  so. Leg swelling continues to come and go, residual erythema and pain and burning sensations.  She continues to feel extremely fatigued as the most noticeable symptom.  She is also had a few mouth sores headaches and swollen glands but also describes the ongoing leg swelling sometimes with pain and discoloration.  Currently her left distal leg is most affected today.   Lupus manifestations Fatigue Arthralgias Photosensitive rash Oral ulcers Raynaud's   Lupus serology ANA 1:80 speckled dsDNA 49 Complement C3, C4 wnl Vit D 19   Review of Systems  Constitutional:  Positive for fatigue.  HENT:  Positive for mouth dryness.   Eyes:  Positive for dryness.  Respiratory:  Positive for shortness of breath.   Cardiovascular:  Positive for swelling in legs/feet.  Gastrointestinal:  Positive for constipation.  Endocrine: Positive for cold intolerance, excessive thirst and increased urination.  Genitourinary:  Negative for difficulty urinating.  Musculoskeletal:  Positive for joint pain, gait problem, joint pain, joint swelling, muscle weakness, morning stiffness and muscle tenderness.  Skin:  Positive for rash and redness.  Allergic/Immunologic: Positive for susceptible to infections.  Neurological:  Positive for numbness and weakness.  Hematological:  Positive for bruising/bleeding tendency.  Psychiatric/Behavioral:  Negative for sleep disturbance.    PMFS History:  Patient Active Problem List   Diagnosis Date Noted   Swelling of lower extremity 12/08/2020   High risk medication use  12/08/2020   Hyperlipidemia 12/03/2020   Hypocalcemia 09/07/2020   Vitamin D deficiency 09/07/2020   Systemic lupus ([place]) 08/21/2020   Dry mouth 08/21/2020   Tobacco use disorder 08/21/2020   Arthralgia 08/21/2020   Raynaud's phenomenon 08/21/2020   Major depression, recurrent ([place]) 04/26/2018   Adult ADHD 12/16/2014   Asthma 05/07/2013   Hypothyroidism 11/26/2012   GAD (generalized anxiety disorder)  11/26/2012    Past Medical History:  Diagnosis Date   Anxiety    Takes xanax twice daily   Asthma    Chronic headaches    "multiple times a day; they say it's related to thyroid"   Complication of anesthesia    Dysrhythmia    tachycardia   Grave's disease    H/O seasonal allergies    Headache(784.0)    most days   Pneumonia 11/02/2011   "years ago"   PONV (postoperative nausea and vomiting)    Systemic lupus erythematosus ([place])    Thyroid disease     Family History  Problem Relation Age of Onset   Hypertension Mother    Hyperlipidemia Mother    Diabetes Mother    Hypertension Father    Hyperlipidemia Father    Hypothyroidism Father    Healthy Sister    Anesthesia problems Neg Hx    Hypotension Neg Hx    Malignant hyperthermia Neg Hx    Pseudochol deficiency Neg Hx    Past Surgical History:  Procedure Laterality Date   THYROIDECTOMY  11/02/2011   Procedure: THYROIDECTOMY;  Surgeon: Izora Gala, MD;  Location: [facility];  Service: ENT;  Laterality: Bilateral;  TOTAL THYROIDECTOMY   TOTAL THYROIDECTOMY  11/02/11   WISDOM TOOTH EXTRACTION  2004   Social History   Social History Narrative   Not on file   Immunization History  Administered Date(s) Administered   DTaP 08/24/1984, 11/05/1984, 12/31/1984, 12/24/1985   Hepatitis B 04/26/1996, 05/31/1996, 08/30/1996   IPV 08/24/1984, 11/05/1984, 12/24/1985   Influenza,inj,Quad PF,6+ Mos 05/25/2020, 05/27/2021   Influenza-Unspecified 06/06/2011, 04/25/2012   MMR 12/24/1985   PPD Test 03/16/2015, 03/23/2015   Pneumococcal Polysaccharide-23 11/03/2011   Tdap 12/10/2010     Objective: Vital Signs: BP 122/74 (BP Location: Left Arm, Patient Position: Sitting, Cuff Size: Normal)    Pulse 87    Resp 15    Ht 5' 8"  (1.727 m)    Wt 180 lb (81.6 kg)    BMI 27.37 kg/m    Physical Exam HENT:     Right Ear: External ear normal.     Left Ear: External ear normal.     Mouth/Throat:     Mouth: Mucous membranes are moist.      Pharynx: Oropharynx is clear.     Comments: Sore on right side of tongue Eyes:     Conjunctiva/sclera: Conjunctivae normal.  Cardiovascular:     Rate and Rhythm: Normal rate and regular rhythm.  Pulmonary:     Effort: Pulmonary effort is normal.     Breath sounds: Normal breath sounds.  Musculoskeletal:     Right lower leg: Edema present.     Left lower leg: Edema present.     Comments: Bilateral lower extremity edema extending about halfway up the calf, medial erythema and tortuous superficial varicosities worse on the left side, mild skin thickening present with loss of hair follicles  Skin:    General: Skin is warm and dry.  Neurological:     Mental Status: She is alert.  Psychiatric:  Mood and Affect: Mood normal.     Musculoskeletal Exam:  Neck full ROM no tenderness Shoulders full ROM no tenderness or swelling Elbows full ROM no tenderness or swelling Wrists full ROM no tenderness or swelling Fingers full ROM no tenderness or swelling Knees full ROM no tenderness or swelling Ankles full ROM, overlying pedal edema  Investigation: No additional findings.  Imaging: No results found.  Recent Labs: Lab Results  Component Value Date   WBC 8.3 03/23/2021   HGB 15.3 03/23/2021   PLT 303 03/23/2021   NA 138 05/27/2021   K 4.0 05/27/2021   CL 98 05/27/2021   CO2 24 05/27/2021   GLUCOSE 82 05/27/2021   BUN 13 05/27/2021   CREATININE 0.98 05/27/2021   BILITOT 0.3 05/27/2021   ALKPHOS 73 05/27/2021   AST 17 05/27/2021   ALT 10 05/27/2021   PROT 6.9 05/27/2021   ALBUMIN 4.4 05/27/2021   CALCIUM 8.0 (L) 05/27/2021   GFRAA 80 02/27/2020   QFTBGOLDPLUS NEGATIVE 12/08/2020    Speciality Comments: PLQ eye exam has not been done yet Benlysta started 01/28/21  Procedures:  No procedures performed Allergies: Patient has no known allergies.   Assessment / Plan:     Visit Diagnoses: Systemic lupus erythematosus, unspecified SLE type, unspecified organ involvement  status ([place]) - Plan: Anti-DNA antibody, double-stranded, C3 and C4, Protein / creatinine ratio, urine, VITAMIN D 25 Hydroxy (Vit-D Deficiency, Fractures), CBC with Differential/Platelet  She was well-controlled on hydroxychloroquine 300 mg daily and Benlysta 200 mg [place] weekly but now having worsening symptoms but with recent infection and lower extremity edema could be major causes.  Recheck dsDNA serum complements urine protein creatinine ratio and CBC today.  Swelling of lower extremity  Substantial worsening of bilateral pedal edema similar as before starting Benlysta earlier this year.  Rechecking for evidence of nephrotic syndrome.  So far she has not had great relief with trying compression sock or foot elevation previous vascular study did not identify venous insufficiency.  If labs otherwise normal consider trial of as needed loop diuretic.  High risk medication use - CBC with Differential/Platelet  Checking CBC for medication monitoring with continued Benlysta treatment.  Raynaud's phenomenon without gangrene  Raynaud's symptoms without particular exacerbation or any lesions with colder weather change.  Vitamin D deficiency  Previous considerably low vitamin D she has been taking replacement now since 9 months ago we will recheck serum vitamin D.   Orders: Orders Placed This Encounter  Procedures   Anti-DNA antibody, double-stranded   C3 and C4   Protein / creatinine ratio, urine   VITAMIN D 25 Hydroxy (Vit-D Deficiency, Fractures)   CBC with Differential/Platelet   No orders of the defined types were placed in this encounter.    Follow-Up Instructions: Return in about 3 months (around 09/13/2021) for SLE on HCQ/BEN f/u 17mo.   CCollier Salina MD  Note - This record has been created using [employer]  Chart creation errors have been sought, but may not always  have been located. Such creation errors do not reflect on  the standard of medical care.

## 2021-06-14 NOTE — Telephone Encounter (Signed)
Returned patient's call and rescheduled patient's appointment.

## 2021-06-15 ENCOUNTER — Other Ambulatory Visit: Payer: Self-pay

## 2021-06-15 ENCOUNTER — Ambulatory Visit: Payer: Medicaid Other | Admitting: Internal Medicine

## 2021-06-15 ENCOUNTER — Encounter: Payer: Self-pay | Admitting: Internal Medicine

## 2021-06-15 VITALS — BP 122/74 | HR 87 | Resp 15 | Ht 68.0 in | Wt 180.0 lb

## 2021-06-15 DIAGNOSIS — I7300 Raynaud's syndrome without gangrene: Secondary | ICD-10-CM

## 2021-06-15 DIAGNOSIS — M329 Systemic lupus erythematosus, unspecified: Secondary | ICD-10-CM

## 2021-06-15 DIAGNOSIS — Z79899 Other long term (current) drug therapy: Secondary | ICD-10-CM

## 2021-06-15 DIAGNOSIS — M7989 Other specified soft tissue disorders: Secondary | ICD-10-CM

## 2021-06-16 LAB — PROTEIN / CREATININE RATIO, URINE
Creatinine, Urine: 283 mg/dL — ABNORMAL HIGH (ref 20–275)
Protein/Creat Ratio: 148 mg/g creat (ref 24–184)
Protein/Creatinine Ratio: 0.148 mg/mg creat (ref 0.024–0.184)
Total Protein, Urine: 42 mg/dL — ABNORMAL HIGH (ref 5–24)

## 2021-06-16 LAB — CBC WITH DIFFERENTIAL/PLATELET
Absolute Monocytes: 574 cells/uL (ref 200–950)
Basophils Absolute: 122 cells/uL (ref 0–200)
Basophils Relative: 1.4 %
Eosinophils Absolute: 296 cells/uL (ref 15–500)
Eosinophils Relative: 3.4 %
HCT: 36 % (ref 35.0–45.0)
Hemoglobin: 12.3 g/dL (ref 11.7–15.5)
Lymphs Abs: 3332 cells/uL (ref 850–3900)
MCH: 29.6 pg (ref 27.0–33.0)
MCHC: 34.2 g/dL (ref 32.0–36.0)
MCV: 86.5 fL (ref 80.0–100.0)
MPV: 10.4 fL (ref 7.5–12.5)
Monocytes Relative: 6.6 %
Neutro Abs: 4376 cells/uL (ref 1500–7800)
Neutrophils Relative %: 50.3 %
Platelets: 379 10*3/uL (ref 140–400)
RBC: 4.16 10*6/uL (ref 3.80–5.10)
RDW: 12.5 % (ref 11.0–15.0)
Total Lymphocyte: 38.3 %
WBC: 8.7 10*3/uL (ref 3.8–10.8)

## 2021-06-16 LAB — C3 AND C4
C3 Complement: 159 mg/dL (ref 83–193)
C4 Complement: 22 mg/dL (ref 15–57)

## 2021-06-16 LAB — ANTI-DNA ANTIBODY, DOUBLE-STRANDED: ds DNA Ab: 56 IU/mL — ABNORMAL HIGH

## 2021-06-16 LAB — VITAMIN D 25 HYDROXY (VIT D DEFICIENCY, FRACTURES): Vit D, 25-Hydroxy: 52 ng/mL (ref 30–100)

## 2021-06-22 ENCOUNTER — Ambulatory Visit (INDEPENDENT_AMBULATORY_CARE_PROVIDER_SITE_OTHER): Payer: Medicaid Other | Admitting: Family Medicine

## 2021-06-22 ENCOUNTER — Encounter: Payer: Self-pay | Admitting: Family Medicine

## 2021-06-22 DIAGNOSIS — J329 Chronic sinusitis, unspecified: Secondary | ICD-10-CM | POA: Diagnosis not present

## 2021-06-22 DIAGNOSIS — J40 Bronchitis, not specified as acute or chronic: Secondary | ICD-10-CM | POA: Diagnosis not present

## 2021-06-22 MED ORDER — PSEUDOEPHEDRINE-GUAIFENESIN ER 120-1200 MG PO TB12: 1.0000 | ORAL_TABLET | Freq: Two times a day (BID) | ORAL | 0 refills | Status: DC

## 2021-06-22 MED ORDER — AMOXICILLIN-POT CLAVULANATE 875-125 MG PO TABS: 1.0000 | ORAL_TABLET | Freq: Two times a day (BID) | ORAL | 0 refills | Status: DC

## 2021-06-22 NOTE — Progress Notes (Signed)
Subjective:    Patient ID: Valerie Ayala, female    DOB: [birth year], 35 y.o.   MRN: [id number]   HPI: Valerie Ayala is a 35 y.o. female presenting for onset over a week ago with flu symptoms. Sore throat, eyes feel like sand in them. Ears hurt, pressure, stopped up. Hearing not great. Throat feels like she is swallowing razor blades. Fever high at first. Now about 100 for a week. Coughing up a lot of dark green sputum. A little short of breath.    Depression screen [facility] 2/9 05/27/2021 02/25/2021 12/03/2020 08/26/2020 05/25/2020  Decreased Interest 1 1 1 1 1   Down, Depressed, Hopeless 1 1 1  0 0  PHQ - 2 Score 2 2 2 1 1   Altered sleeping 3 1 3  - -  Tired, decreased energy 3 3 3  - -  Change in appetite 2 0 0 - -  Feeling bad or failure about yourself  1 1 0 - -  Trouble concentrating 2 0 1 - -  Moving slowly or fidgety/restless 0 0 0 - -  Suicidal thoughts 0 0 0 - -  PHQ-9 Score 13 7 9  - -  Some recent data might be hidden     Relevant past medical, surgical, family and social history reviewed and updated as indicated.  Interim medical history since our last visit reviewed. Allergies and medications reviewed and updated.  ROS:  Review of Systems  Constitutional:  Positive for fever. Negative for activity change, appetite change and chills.  HENT:  Positive for congestion, ear pain, hearing loss, postnasal drip, rhinorrhea, sinus pressure, sore throat and trouble swallowing. Negative for ear discharge, nosebleeds and sneezing.   Respiratory:  Positive for cough and shortness of breath. Negative for choking and chest tightness.   Cardiovascular:  Negative for chest pain and palpitations.  Skin:  Negative for rash.    Social History   Tobacco Use  Smoking Status Former   Packs/day: 1.00   Years: 12.00   Pack years: 12.00   Types: Cigarettes   Quit date: 04/27/2021   Years since quitting: 0.1  Smokeless Tobacco Never  Tobacco Comments   Patient is taking Chantix, x 1 month, patient  is trying to stop smoking        Objective:     Wt Readings from Last 3 Encounters:  06/15/21 180 lb (81.6 kg)  05/27/21 179 lb (81.2 kg)  03/23/21 164 lb (74.4 kg)     Exam deferred. Pt. [place] due to COVID 19. Phone visit performed.   Assessment & Plan:   1. Sinobronchitis     Meds ordered this encounter  Medications   amoxicillin-clavulanate (AUGMENTIN) 875-125 MG tablet    Sig: Take 1 tablet by mouth 2 (two) times daily. Take all of this medication    Dispense:  20 tablet    Refill:  0   Pseudoephedrine-Guaifenesin [phone] MG TB12    Sig: Take 1 tablet by mouth 2 (two) times daily. For congestion    Dispense:  12 tablet    Refill:  0    No orders of the defined types were placed in this encounter.     Diagnoses and all orders for this visit:  Sinobronchitis  Other orders -     amoxicillin-clavulanate (AUGMENTIN) 875-125 MG tablet; Take 1 tablet by mouth 2 (two) times daily. Take all of this medication -     Pseudoephedrine-Guaifenesin [phone] MG TB12; Take 1 tablet by mouth 2 (two) times daily.  For congestion    Virtual Visit via telephone Note  I discussed the limitations, risks, security and privacy concerns of performing an evaluation and management service by telephone and the availability of in person appointments. The patient was identified with two identifiers. Pt.expressed understanding and agreed to proceed. Pt. Is at home. Dr. Darlyn Read is in his office.  Follow Up Instructions:   I discussed the assessment and treatment plan with the patient. The patient was provided an opportunity to ask questions and all were answered. The patient agreed with the plan and demonstrated an understanding of the instructions.   The patient was advised to call back or seek an in-person evaluation if the symptoms worsen or if the condition fails to improve as anticipated.   Total minutes including chart review and phone contact time: 7   Follow up plan: No  follow-ups on file.  Mechele Claude, MD [place] [facility]

## 2021-06-24 ENCOUNTER — Ambulatory Visit: Payer: Medicaid Other | Admitting: Internal Medicine

## 2021-07-02 ENCOUNTER — Other Ambulatory Visit: Payer: Self-pay | Admitting: Internal Medicine

## 2021-07-02 DIAGNOSIS — R768 Other specified abnormal immunological findings in serum: Secondary | ICD-10-CM

## 2021-07-02 DIAGNOSIS — M2550 Pain in unspecified joint: Secondary | ICD-10-CM

## 2021-07-05 ENCOUNTER — Other Ambulatory Visit (HOSPITAL_COMMUNITY): Payer: Self-pay

## 2021-07-07 ENCOUNTER — Other Ambulatory Visit (HOSPITAL_COMMUNITY): Payer: Self-pay

## 2021-07-26 ENCOUNTER — Other Ambulatory Visit: Payer: Self-pay | Admitting: Family Medicine

## 2021-07-28 ENCOUNTER — Encounter: Payer: Self-pay | Admitting: Family Medicine

## 2021-07-28 ENCOUNTER — Ambulatory Visit (INDEPENDENT_AMBULATORY_CARE_PROVIDER_SITE_OTHER): Payer: Medicaid Other | Admitting: Family Medicine

## 2021-07-28 VITALS — BP 130/76 | HR 112 | Ht 68.0 in | Wt 175.0 lb

## 2021-07-28 DIAGNOSIS — E890 Postprocedural hypothyroidism: Secondary | ICD-10-CM

## 2021-07-28 DIAGNOSIS — F411 Generalized anxiety disorder: Secondary | ICD-10-CM | POA: Diagnosis not present

## 2021-07-28 DIAGNOSIS — Z23 Encounter for immunization: Secondary | ICD-10-CM | POA: Diagnosis not present

## 2021-07-28 DIAGNOSIS — F331 Major depressive disorder, recurrent, moderate: Secondary | ICD-10-CM | POA: Diagnosis not present

## 2021-07-28 DIAGNOSIS — F909 Attention-deficit hyperactivity disorder, unspecified type: Secondary | ICD-10-CM

## 2021-07-28 MED ORDER — BREXPIPRAZOLE 1 MG PO TABS: 1.0000 mg | ORAL_TABLET | Freq: Every day | ORAL | 2 refills | Status: DC

## 2021-07-28 NOTE — Progress Notes (Signed)
BP 130/76    Pulse (!) 112    Ht 5\' 8"  (1.727 m)    Wt 175 lb (79.4 kg)    SpO2 100%    BMI 26.61 kg/m    Subjective:   Patient ID: Valerie Ayala, female    DOB: July 07, 1985, 36 y.o.   MRN: [id number]  HPI: Valerie Ayala is a 36 y.o. female presenting on 07/28/2021 for Medical Management of Chronic Issues, ADHD, and Depression   HPI ADHD depression and anxiety Patient is currently on Rexulti 0.5 and Prozac for ADHD and depression.  She feels like she doing okay on it but does not feel great and would like to see about increasing it.  She has had no side effects from the [place]. Depression screen [facility] 2/9 07/28/2021 05/27/2021 02/25/2021 12/03/2020 08/26/2020  Decreased Interest 2 1 1 1 1   Down, Depressed, Hopeless 2 1 1 1  0  PHQ - 2 Score 4 2 2 2 1   Altered sleeping 3 3 1 3  -  Tired, decreased energy 3 3 3 3  -  Change in appetite 2 2 0 0 -  Feeling bad or failure about yourself  2 1 1  0 -  Trouble concentrating 1 2 0 1 -  Moving slowly or fidgety/restless 0 0 0 0 -  Suicidal thoughts 0 0 0 0 -  PHQ-9 Score 15 13 7 9  -  Some recent data might be hidden    Hypothyroidism recheck Patient is coming in for thyroid recheck today as well. They deny any issues with hair changes or heat or cold problems or diarrhea or constipation. They deny any chest pain or palpitations. They are currently on levothyroxine 200 and 175 micrograms every other day  Relevant past medical, surgical, family and social history reviewed and updated as indicated. Interim medical history since our last visit reviewed. Allergies and medications reviewed and updated.  Review of Systems  Constitutional:  Negative for chills and fever.  Eyes:  Negative for visual disturbance.  Respiratory:  Negative for chest tightness and shortness of breath.   Cardiovascular:  Negative for chest pain and leg swelling.  Musculoskeletal:  Negative for arthralgias, back pain and gait problem.  Skin:  Negative for rash.  Neurological:  Negative  for dizziness, light-headedness and headaches.  Psychiatric/Behavioral:  Positive for dysphoric mood. Negative for agitation, behavioral problems, self-injury, sleep disturbance and suicidal ideas. The patient is nervous/anxious.   All other systems reviewed and are negative.  Per HPI unless specifically indicated above   Allergies as of 07/28/2021   No Known Allergies      Medication List        Accurate as of July 28, 2021  1:58 PM. If you have any questions, ask your nurse or doctor.          albuterol 108 (90 Base) MCG/ACT inhaler Commonly known as: VENTOLIN HFA Inhale 2 puffs into the lungs every 6 (six) hours as needed. For shortness of breath   amoxicillin-clavulanate 875-125 MG tablet Commonly known as: AUGMENTIN Take 1 tablet by mouth 2 (two) times daily. Take all of this medication   atorvastatin 40 MG tablet Commonly known as: LIPITOR Take 1 tablet (40 mg total) by mouth at bedtime.   Benlysta 200 MG/ML [place] Generic drug: Belimumab Inject 200 mg into the skin once a week.   brexpiprazole 1 MG Tabs tablet Commonly known as: Rexulti Take 1 tablet (1 mg total) by mouth daily. What changed:  medication strength how  much to take when to take this Changed by: Fransisca Kaufmann Deanglo Hissong, MD   cholecalciferol 1000 units tablet Commonly known as: VITAMIN D Take 1,000 Units by mouth daily.   FLUoxetine 20 MG capsule Commonly known as: PROZAC Take 1 capsule (20 mg total) by mouth daily.   hydroxychloroquine 200 MG tablet Commonly known as: PLAQUENIL Take 1 AND 1/2 tablets (300 mg total) by mouth daily.   levothyroxine 175 MCG tablet Commonly known as: SYNTHROID Take 1 tablet (175 mcg total) by mouth daily.   levothyroxine 200 MCG tablet Commonly known as: SYNTHROID TAKE ONE TABLET (200 mcg total) BY MOUTH DAILY   medroxyPROGESTERone 150 MG/ML injection Commonly known as: DEPO-PROVERA Inject 1 mL (150 mg total) into the muscle every 3 (three) months.    Pseudoephedrine-Guaifenesin [phone] MG Tb12 Take 1 tablet by mouth 2 (two) times daily. For congestion         Objective:   BP 130/76    Pulse (!) 112    Ht 5\' 8"  (1.727 m)    Wt 175 lb (79.4 kg)    SpO2 100%    BMI 26.61 kg/m   Wt Readings from Last 3 Encounters:  07/28/21 175 lb (79.4 kg)  06/15/21 180 lb (81.6 kg)  05/27/21 179 lb (81.2 kg)    Physical Exam Vitals and nursing note reviewed.  Constitutional:      General: She is not in acute distress.    Appearance: She is well-developed. She is not diaphoretic.  Eyes:     Conjunctiva/sclera: Conjunctivae normal.  Cardiovascular:     Rate and Rhythm: Normal rate and regular rhythm.     Heart sounds: Normal heart sounds. No murmur heard. Pulmonary:     Effort: Pulmonary effort is normal. No respiratory distress.     Breath sounds: Normal breath sounds. No wheezing.  Musculoskeletal:        General: No swelling or tenderness. Normal range of motion.  Skin:    General: Skin is warm and dry.     Findings: No rash.  Neurological:     Mental Status: She is alert and oriented to person, place, and time.     Coordination: Coordination normal.  Psychiatric:        Behavior: Behavior normal.      Assessment & Plan:   Problem List Items Addressed This Visit       Endocrine   Hypothyroidism     Other   GAD (generalized anxiety disorder) - Primary   Relevant Medications   brexpiprazole (REXULTI) 1 MG TABS tablet   Adult ADHD   Major depression, recurrent (HCC)   Relevant Medications   brexpiprazole (REXULTI) 1 MG TABS tablet   Other Visit Diagnoses     Need for Tdap vaccination       Relevant Orders   Tdap vaccine greater than or equal to 7yo IM (Completed)       Recheck thyroid, she is alternating doses between 200 and 175 mcg every other day. Follow up plan: Return for 1-40-month recheck for depression.  Counseling provided for all of the vaccine components Orders Placed This Encounter  Procedures    Tdap vaccine greater than or equal to 60yo IM    Caryl Pina, MD [facility] 07/28/2021, 1:58 PM

## 2021-07-28 NOTE — Addendum Note (Signed)
Addended by: Arville Care on: 07/28/2021 02:10 PM   Modules accepted: Orders

## 2021-07-29 LAB — TSH: TSH: 0.319 u[IU]/mL — ABNORMAL LOW (ref 0.450–4.500)

## 2021-08-02 ENCOUNTER — Other Ambulatory Visit: Payer: Self-pay | Admitting: Internal Medicine

## 2021-08-02 ENCOUNTER — Other Ambulatory Visit (HOSPITAL_COMMUNITY): Payer: Self-pay

## 2021-08-02 DIAGNOSIS — M329 Systemic lupus erythematosus, unspecified: Secondary | ICD-10-CM

## 2021-08-02 NOTE — Telephone Encounter (Signed)
Next Visit: 09/14/2020 °  °Last Visit: 06/15/2021 °  °Last Fill: 06/09/2021 °  °DX:Systemic lupus erythematosus, unspecified SLE type, unspecified organ involvement status  °  °Current Dose per office note 06/15/2021: Benlysta 200 mg [place] weekly  °  °Labs: 05/27/2021 calcium 8.0, 06/15/2021 CBC WNL °  °TB Gold: 12/08/2020 Neg   °  °Okay to refill Benlysta?  °

## 2021-08-03 ENCOUNTER — Other Ambulatory Visit (HOSPITAL_COMMUNITY): Payer: Self-pay

## 2021-08-04 ENCOUNTER — Other Ambulatory Visit: Payer: Self-pay | Admitting: Internal Medicine

## 2021-08-04 ENCOUNTER — Other Ambulatory Visit (HOSPITAL_COMMUNITY): Payer: Self-pay

## 2021-08-04 DIAGNOSIS — M329 Systemic lupus erythematosus, unspecified: Secondary | ICD-10-CM

## 2021-08-04 MED ORDER — BENLYSTA 200 MG/ML ~~LOC~~ SOAJ
200.0000 mg | SUBCUTANEOUS | 1 refills | Status: DC
  Filled 2021-08-04: qty 4, 28d supply, fill #0
  Filled 2021-08-27: qty 4, 28d supply, fill #1

## 2021-08-04 NOTE — Telephone Encounter (Signed)
Next Visit: 09/14/2020 °  °Last Visit: 06/15/2021 °  °Last Fill: 06/09/2021 °  °DX:Systemic lupus erythematosus, unspecified SLE type, unspecified organ involvement status  °  °Current Dose per office note 06/15/2021: Benlysta 200 mg [place] weekly  °  °Labs: 05/27/2021 calcium 8.0, 06/15/2021 CBC WNL °  °TB Gold: 12/08/2020 Neg   °  °Okay to refill Benlysta?  °

## 2021-08-04 NOTE — Telephone Encounter (Signed)
Received notification from [facility] that patient's Benlysta shipment is overdue and requesting refill.  Rx sent for 2 months for Benlysta. Patient has f/u visit on 09/14/20  Chesley Mires, PharmD, MPH, BCPS Clinical Pharmacist (Rheumatology and Pulmonology)

## 2021-08-04 NOTE — Telephone Encounter (Signed)
Next Visit: 09/14/2020   Last Visit: 06/15/2021   Last Fill: 06/09/2021   [id number] lupus erythematosus, unspecified SLE type, unspecified organ involvement status    Current Dose per office note 06/15/2021: Benlysta 200 mg [place] weekly    Labs: 05/27/2021 calcium 8.0, 06/15/2021 CBC WNL   TB Gold: 12/08/2020 Neg     Okay to refill Benlysta?

## 2021-08-05 ENCOUNTER — Other Ambulatory Visit (HOSPITAL_COMMUNITY): Payer: Self-pay

## 2021-08-26 ENCOUNTER — Ambulatory Visit (INDEPENDENT_AMBULATORY_CARE_PROVIDER_SITE_OTHER): Payer: Medicaid Other

## 2021-08-26 DIAGNOSIS — Z3042 Encounter for surveillance of injectable contraceptive: Secondary | ICD-10-CM

## 2021-08-26 MED ORDER — MEDROXYPROGESTERONE ACETATE 150 MG/ML IM SUSP
150.0000 mg | INTRAMUSCULAR | Status: AC
  Administered 2021-08-26 – 2022-05-16 (×4): 150 mg via INTRAMUSCULAR

## 2021-08-26 NOTE — Progress Notes (Signed)
Medroxyprogesterone injection given to right upper outer quadrant.  Patient tolerated well. 

## 2021-08-27 ENCOUNTER — Other Ambulatory Visit (HOSPITAL_COMMUNITY): Payer: Self-pay

## 2021-09-02 ENCOUNTER — Other Ambulatory Visit (HOSPITAL_COMMUNITY): Payer: Self-pay

## 2021-09-08 ENCOUNTER — Other Ambulatory Visit: Payer: Self-pay

## 2021-09-08 ENCOUNTER — Ambulatory Visit (INDEPENDENT_AMBULATORY_CARE_PROVIDER_SITE_OTHER): Payer: Medicaid Other | Admitting: Adult Health

## 2021-09-08 ENCOUNTER — Other Ambulatory Visit (HOSPITAL_COMMUNITY)
Admission: RE | Admit: 2021-09-08 | Discharge: 2021-09-08 | Disposition: A | Discharge status: Home / Self Care | Payer: Medicaid Other | Source: Ambulatory Visit | Attending: Adult Health | Admitting: Adult Health

## 2021-09-08 ENCOUNTER — Encounter: Payer: Self-pay | Admitting: Adult Health

## 2021-09-08 VITALS — BP 126/78 | HR 76 | Ht 67.0 in | Wt 179.5 lb

## 2021-09-08 DIAGNOSIS — Z01419 Encounter for gynecological examination (general) (routine) without abnormal findings: Secondary | ICD-10-CM | POA: Insufficient documentation

## 2021-09-08 DIAGNOSIS — Z8742 Personal history of other diseases of the female genital tract: Secondary | ICD-10-CM | POA: Diagnosis not present

## 2021-09-08 DIAGNOSIS — Z0000 Encounter for general adult medical examination without abnormal findings: Secondary | ICD-10-CM | POA: Insufficient documentation

## 2021-09-08 NOTE — Progress Notes (Signed)
Patient ID: Valerie Ayala, female   DOB: [birth year], 36 y.o.   MRN: [id number] ?History of Present Illness: ?Valerie Ayala is a 36 year old white female, married, G2P2, in for a well woman gyn exam and pap. 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 ?PCP is Dr Louanne Skye.  ? ? ?Current Medications, Allergies, Past Medical History, Past Surgical History, Family History and Social History were reviewed in [employer] record.   ? ? ?Review of Systems: ?Patient denies any headaches, hearing loss, fatigue, blurred vision, shortness of breath, chest pain, abdominal pain, problems with bowel movements, urination, or intercourse. No joint pain or mood swings.  ?No periods on depo. ? ?Physical Exam:BP 126/78 (BP Location: Left Arm, Patient Position: Sitting, Cuff Size: Normal)   Pulse 76   Ht 5\' 7"  (1.702 m)   Wt 179 lb 8 oz (81.4 kg)   BMI 28.11 kg/m?   ?General:  Well developed, well nourished, no acute distress ?Skin:  Warm and dry ?Neck:  Midline trachea,  thyroid is surgically absent, good ROM, no lymphadenopathy ?Lungs; Clear to auscultation bilaterally ?Breast:  No dominant palpable mass, retraction, or nipple discharge ?Cardiovascular: Regular rate and rhythm ?Abdomen:  Soft, non tender, no hepatosplenomegaly ?Pelvic:  External genitalia is normal in appearance, no lesions.  The vagina is normal in appearance. Urethra has no lesions or masses. The cervix is everted at os, pap with HR HPV genotyping performed, and cervix is deviated to the left.  Uterus is felt to be normal size, shape, and contour.  No adnexal masses or tenderness noted.Bladder is non tender, no masses felt. ?Rectal:Deferred ?Extremities/musculoskeletal:  No swelling or varicosities noted, no clubbing or cyanosis ?Psych:  No mood changes, alert and cooperative,seems happy ?AA is 0 ?Fall risk is low ?Depression screen [facility] 2/9 09/08/2021 07/28/2021 05/27/2021  ?Decreased Interest 1 2 1   ?Down, Depressed, Hopeless 1 2 1   ?PHQ - 2 Score 2 4 2    ?Altered sleeping 3 3 3   ?Tired, decreased energy 3 3 3   ?Change in appetite 1 2 2   ?Feeling bad or failure about yourself  1 2 1   ?Trouble concentrating 1 1 2   ?Moving slowly or fidgety/restless 0 0 0  ?Suicidal thoughts 0 0 0  ?PHQ-9 Score 11 15 13   ?Some recent data might be hidden  ?  ?GAD 7 : Generalized Anxiety Score 09/08/2021 07/28/2021 05/27/2021 02/25/2021  ?Nervous, Anxious, on Edge 0 1 1 1   ?Control/stop worrying 0 1 1 1   ?Worry too much - different things 0 1 2 1   ?Trouble relaxing 0 0 2 1  ?Restless 1 0 1 0  ?Easily annoyed or irritable 1 2 2 1   ?Afraid - awful might happen 0 0 0 0  ?Total GAD 7 Score 2 5 9 5   ? ?  ? [place] - 09/08/21 1059   ? ?  ? Pregnancy Intention Screening  ? Does the patient want to become pregnant in the next year? No   ? Does the patient's partner want to become pregnant in the next year? No   ? Would the patient like to discuss contraceptive options today? Yes   ?  ? Contraception Wrap Up  ? Current Method Hormonal Injection   ? End Method Hormonal Injection   ? ?  ?  ? ?  ?  ?Co exam with NP student ? ?Impression and Plan: ?1. Routine general medical examination at a health care facility ?Pap sent ?Physical with PCP  next year  ? ?2. Encounter for gynecological examination with Papanicolaou smear of cervix ?Pap sent ?Pap in 3 years if normal ?Physical with PCP ?Labs with PCP ? ? ?3. History of abnormal cervical Pap smear ?  ? ? ? ?  ?  ?

## 2021-09-14 ENCOUNTER — Ambulatory Visit: Payer: Medicaid Other | Admitting: Internal Medicine

## 2021-09-14 LAB — CYTOLOGY - PAP
Comment: NEGATIVE
Comment: NEGATIVE
Diagnosis: UNDETERMINED — AB
HPV 16: POSITIVE — AB
HPV 18 / 45: NEGATIVE
High risk HPV: POSITIVE — AB

## 2021-09-15 ENCOUNTER — Telehealth: Payer: Self-pay | Admitting: Adult Health

## 2021-09-15 ENCOUNTER — Encounter: Payer: Self-pay | Admitting: Adult Health

## 2021-09-15 DIAGNOSIS — R87610 Atypical squamous cells of undetermined significance on cytologic smear of cervix (ASC-US): Secondary | ICD-10-CM

## 2021-09-15 HISTORY — DX: Atypical squamous cells of undetermined significance on cytologic smear of cervix (ASC-US): R87.610

## 2021-09-15 NOTE — Telephone Encounter (Signed)
Pt aware of pap, needs colpo, appt made ?

## 2021-09-24 ENCOUNTER — Other Ambulatory Visit (HOSPITAL_COMMUNITY): Payer: Self-pay

## 2021-09-24 ENCOUNTER — Other Ambulatory Visit: Payer: Self-pay | Admitting: Internal Medicine

## 2021-09-24 DIAGNOSIS — M329 Systemic lupus erythematosus, unspecified: Secondary | ICD-10-CM

## 2021-09-24 NOTE — Telephone Encounter (Signed)
Next Visit: 10/14/2021 ?  ?Last Visit: 06/15/2021 ?  ?Last Fill: 08/04/2021 ? ?CB:SWHQPRFF lupus erythematosus, unspecified SLE type, unspecified organ involvement status  ?  ?Current Dose per office note 06/15/2021: Benlysta 200 mg [place] weekly  ?  ?Labs: 05/27/2021 calcium 8.0, 06/15/2021 CBC WNL ?  ?TB Gold: 12/08/2020 Neg   ?  ?Okay to refill Benlysta?  ?

## 2021-09-27 ENCOUNTER — Other Ambulatory Visit: Payer: Self-pay | Admitting: Internal Medicine

## 2021-09-27 ENCOUNTER — Other Ambulatory Visit (HOSPITAL_COMMUNITY): Payer: Self-pay

## 2021-09-27 DIAGNOSIS — M329 Systemic lupus erythematosus, unspecified: Secondary | ICD-10-CM

## 2021-09-27 NOTE — Telephone Encounter (Signed)
Next Visit: 10/14/2021 ?  ?Last Visit: 06/15/2021 ?  ?Last Fill: 08/04/2021 ? ?DX:Systemic lupus erythematosus, unspecified SLE type, unspecified organ involvement status  ?  ?Current Dose per office note 06/15/2021: Benlysta 200 mg [place] weekly  ?  ?Labs: 05/27/2021 calcium 8.0, 06/15/2021 CBC WNL ?  ?TB Gold: 12/08/2020 Neg   ?  ?Okay to refill Benlysta?  ?

## 2021-09-28 ENCOUNTER — Other Ambulatory Visit (HOSPITAL_COMMUNITY): Payer: Self-pay

## 2021-09-28 MED ORDER — BENLYSTA 200 MG/ML ~~LOC~~ SOAJ
200.0000 mg | SUBCUTANEOUS | 2 refills | Status: DC
Start: 2021-09-28 — End: 2021-12-14
  Filled 2021-09-28: qty 4, 28d supply, fill #0
  Filled 2021-10-21: qty 4, 28d supply, fill #1
  Filled 2021-11-19: qty 4, 28d supply, fill #2

## 2021-09-29 ENCOUNTER — Other Ambulatory Visit (HOSPITAL_COMMUNITY): Payer: Self-pay

## 2021-09-29 ENCOUNTER — Other Ambulatory Visit: Payer: Self-pay | Admitting: Internal Medicine

## 2021-09-29 ENCOUNTER — Other Ambulatory Visit: Payer: Self-pay | Admitting: Family Medicine

## 2021-09-29 DIAGNOSIS — Z716 Tobacco abuse counseling: Secondary | ICD-10-CM

## 2021-09-29 DIAGNOSIS — M2550 Pain in unspecified joint: Secondary | ICD-10-CM

## 2021-09-29 DIAGNOSIS — R768 Other specified abnormal immunological findings in serum: Secondary | ICD-10-CM

## 2021-09-29 NOTE — Telephone Encounter (Signed)
Next Visit: 10/14/2021 ?  ?Last Visit: 06/15/2021 ?  ?Last Fill: 07/05/2021 ? ?ZD:GLOVFIEP lupus erythematosus, unspecified SLE type, unspecified organ involvement status  ?  ?Current Dose per office note 06/15/2021: ? ?Labs: 05/27/2021 calcium 8.0, 06/15/2021 CBC WNL ? ?PLQ Eye Exam: not on file ? ?Okay to refill PLQ?  ?

## 2021-10-13 NOTE — Progress Notes (Signed)
? ?Office Visit Note ? ?Patient: Valerie Ayala             ?Date of Birth: 02-19-1986           ?MRN: [id number]             ?PCP: Dettinger, Fransisca Kaufmann, MD ?Referring: Dettinger, Fransisca Kaufmann, MD ?Visit Date: 10/14/2021 ? ? ?Subjective:  ? ?History of Present Illness: Valerie Ayala is a 36 y.o. female here for follow up for SLE on HCQ 300 mg daily and Benlysta 200 mg [place] weekly. At last visit significant worsening in lower extremity swelling but normal lab markers and negative for proteinuria.  Since the last visit he continues to do very poorly.  She has ongoing leg swelling itching sometimes there is redness discoloration.  Newer problem is feeling some proximal leg weakness occasional sense of her legs giving out or locking up when walking.  She is extremely fatigued almost every day.  Sometimes she cannot even get out of bed for hours and her mood is very poor due to decreased ability to do anything.  She is also been noticing some palpitations and lightheadedness getting short of breath with very brief or mild exertion. ? ? ?Previous HPI ?06/15/21 ?Valerie Ayala is a 36 y.o. female here for follow up for SLE on HCQ 300 mg daily and Benlysta 200 mg  weekly.  She was doing very well after our last visit until about a month ago now feels everything is horrible.  She has significant bilateral leg swelling with pitting edema developing increased tenderness and painful burning sensation with discolorations and skin changes in both legs.  She feels very fatigued.  Some skin dryness and peeling on the hands but no significant erythematous rash outside of the lower legs.  She recently also had flu with generalized body pains prescribed very large anterior cervical adenopathy.  This improved though she still has a residual ulcer in the mouth. Labs earlier this month showing very low TSH level with recent slight downward adjustment of her thyroid replacement.  She quit smoking with use of Chantix. ?  ?Previous HPI ?12/08/20 ?Valerie Ayala is a 35 y.o. female here for follow up for systemic lupus on hydroxychloroquine 300 mg p.o. daily. No signfiicant improvement since starting hydroxychloroquine 8 weeks ago.  She has not yet seen ophthalmology for retinal exam but plans to do so. Leg swelling continues to come and go, residual erythema and pain and burning sensations.  She continues to feel extremely fatigued as the most noticeable symptom.  She is also had a few mouth sores headaches and swollen glands but also describes the ongoing leg swelling sometimes with pain and discoloration.  Currently her left distal leg is most affected today. ?  ?Lupus manifestations ?Fatigue ?Arthralgias ?Photosensitive rash ?Oral ulcers ?Raynaud's ?  ?Lupus serology ?ANA 1:80 speckled ?dsDNA 49 ?Complement C3, C4 wnl ?Vit D 19 ?  ? ? ?Review of Systems  ?Constitutional:  Positive for fatigue and weight gain.  ?HENT:  Positive for mouth dryness. Negative for mouth sores and nose dryness.   ?Eyes:  Positive for itching. Negative for pain and dryness.  ?Respiratory:  Positive for shortness of breath. Negative for cough, wheezing and difficulty breathing.   ?Cardiovascular:  Negative for chest pain and palpitations.  ?Gastrointestinal:  Positive for constipation. Negative for blood in stool and diarrhea.  ?Endocrine: Negative for increased urination.  ?Genitourinary:  Positive for nocturia. Negative for difficulty urinating.  ?Musculoskeletal:  Positive for joint pain, joint pain, joint swelling, myalgias, morning stiffness, muscle tenderness and myalgias.  ?Skin:  Positive for color change. Negative for rash and redness.  ?Allergic/Immunologic: Positive for susceptible to infections.  ?Neurological:  Positive for dizziness, numbness, memory loss and weakness. Negative for headaches.  ?Hematological:  Positive for bruising/bleeding tendency.  ?Psychiatric/Behavioral:  Negative for confusion.   ? ?PMFS History:  ?Patient Active Problem List  ? Diagnosis Date Noted  ?  ASCUS with positive high risk HPV cervical 09/15/2021  ? History of abnormal cervical Pap smear 09/08/2021  ? Encounter for gynecological examination with Papanicolaou smear of cervix 09/08/2021  ? Routine general medical examination at a health care facility 09/08/2021  ? Swelling of lower extremity 12/08/2020  ? High risk medication use 12/08/2020  ? Hyperlipidemia 12/03/2020  ? Hypocalcemia 09/07/2020  ? Vitamin D deficiency 09/07/2020  ? Systemic lupus ([place]) 08/21/2020  ? Dry mouth 08/21/2020  ? Tobacco use disorder 08/21/2020  ? Arthralgia 08/21/2020  ? Raynaud's phenomenon 08/21/2020  ? Major depression, recurrent ([place]) 04/26/2018  ? Adult ADHD 12/16/2014  ? Asthma 05/07/2013  ? Hypothyroidism 11/26/2012  ? GAD (generalized anxiety disorder) 11/26/2012  ?  ?Past Medical History:  ?Diagnosis Date  ? Anxiety   ? Takes xanax twice daily  ? ASCUS with positive high risk HPV cervical 09/15/2021  ? 08/2021 +HPV 16/0ther ASCUS, needs colpo, immediate risk CIN3+ 9%  ? Asthma   ? Chronic headaches   ? "multiple times a day; they say it's related to thyroid"  ? Complication of anesthesia   ? Dysrhythmia   ? tachycardia  ? Grave's disease   ? H/O seasonal allergies   ? Headache(784.0)   ? most days  ? Pneumonia 11/02/2011  ? "years ago"  ? PONV (postoperative nausea and vomiting)   ? Systemic lupus erythematosus ([place])   ? Thyroid disease   ?  ?Family History  ?Problem Relation Age of Onset  ? Hypertension Father   ? Hyperlipidemia Father   ? Hypothyroidism Father   ? Hypertension Mother   ? Hyperlipidemia Mother   ? Diabetes Mother   ? Healthy Sister   ? Anesthesia problems Neg Hx   ? Hypotension Neg Hx   ? Malignant hyperthermia Neg Hx   ? Pseudochol deficiency Neg Hx   ? ?Past Surgical History:  ?Procedure Laterality Date  ? THYROIDECTOMY  11/02/2011  ? Procedure: THYROIDECTOMY;  Surgeon: Izora Gala, MD;  Location: [facility];  Service: ENT;  Laterality: Bilateral;  TOTAL THYROIDECTOMY  ? TOTAL THYROIDECTOMY  11/02/11  ? [place] EXTRACTION  2004  ? ?Social History  ? ?Social History Narrative  ? Not on file  ? ?Immunization History  ?Administered Date(s) Administered  ? DTaP 08/24/1984, 11/05/1984, 12/31/1984, 12/24/1985  ? Hepatitis B 04/26/1996, 05/31/1996, 08/30/1996  ? IPV 08/24/1984, 11/05/1984, 12/24/1985  ? Influenza,inj,Quad PF,6+ Mos 05/25/2020, 05/27/2021  ? Influenza-Unspecified 06/06/2011, 04/25/2012  ? MMR 12/24/1985  ? PPD Test 03/16/2015, 03/23/2015  ? Pneumococcal Polysaccharide-23 11/03/2011  ? Tdap 12/10/2010, 07/28/2021  ?  ? ?Objective: ?Vital Signs: BP 129/66 (BP Location: Left Arm, Patient Position: Sitting, Cuff Size: Normal)   Pulse (!) 113   Ht 5' 7"  (1.702 m)   Wt 177 lb (80.3 kg)   BMI 27.72 kg/m?   ? ?Physical Exam ?HENT:  ?   Right Ear: External ear normal.  ?   Left Ear: External ear normal.  ?   Mouth/Throat:  ?   Mouth: Mucous membranes are  moist.  ?   Pharynx: Oropharynx is clear.  ?Eyes:  ?   Conjunctiva/sclera: Conjunctivae normal.  ?Cardiovascular:  ?   Rate and Rhythm: Regular rhythm. Tachycardia present.  ?Pulmonary:  ?   Effort: Pulmonary effort is normal.  ?   Breath sounds: Normal breath sounds.  ?Musculoskeletal:  ?   Right lower leg: Edema present.  ?   Left lower leg: Edema present.  ?Skin: ?   General: Skin is warm and dry.  ?   Findings: Rash present.  ?Neurological:  ?   Mental Status: She is alert.  ?Psychiatric:  ?   Comments: Tearful discussing symptoms effect on quality of life  ?  ? ?Musculoskeletal Exam:  ?Shoulders full ROM no tenderness or swelling ?Elbows full ROM no tenderness or swelling ?Wrists full ROM no tenderness or swelling ?Fingers full ROM no tenderness or swelling ?No paraspinal tenderness to palpation over upper and lower back ?Hip normal internal and external rotation without pain, no tenderness to lateral hip palpation ?Knees full ROM no tenderness or swelling ?Ankles full ROM no tenderness or swelling ? ?Investigation: ?No additional findings. ? ?Imaging: ?No  results found. ? ?Recent Labs: ?Lab Results  ?Component Value Date  ? WBC 9.7 10/14/2021  ? HGB 14.2 10/14/2021  ? PLT 265 10/14/2021  ? NA 138 10/14/2021  ? K 4.3 10/14/2021  ? CL 105 10/14/2021  ? CO2 23

## 2021-10-14 ENCOUNTER — Other Ambulatory Visit: Payer: Self-pay | Admitting: Family Medicine

## 2021-10-14 ENCOUNTER — Encounter: Payer: Self-pay | Admitting: Internal Medicine

## 2021-10-14 ENCOUNTER — Ambulatory Visit: Payer: Medicaid Other | Admitting: Internal Medicine

## 2021-10-14 VITALS — BP 129/66 | HR 113 | Ht 67.0 in | Wt 177.0 lb

## 2021-10-14 DIAGNOSIS — F411 Generalized anxiety disorder: Secondary | ICD-10-CM

## 2021-10-14 DIAGNOSIS — M329 Systemic lupus erythematosus, unspecified: Secondary | ICD-10-CM | POA: Diagnosis not present

## 2021-10-14 DIAGNOSIS — Z79899 Other long term (current) drug therapy: Secondary | ICD-10-CM | POA: Diagnosis not present

## 2021-10-14 DIAGNOSIS — I7300 Raynaud's syndrome without gangrene: Secondary | ICD-10-CM | POA: Diagnosis not present

## 2021-10-14 DIAGNOSIS — F331 Major depressive disorder, recurrent, moderate: Secondary | ICD-10-CM | POA: Diagnosis not present

## 2021-10-14 DIAGNOSIS — E559 Vitamin D deficiency, unspecified: Secondary | ICD-10-CM | POA: Diagnosis not present

## 2021-10-15 LAB — CBC WITH DIFFERENTIAL/PLATELET
Absolute Monocytes: 553 cells/uL (ref 200–950)
Basophils Absolute: 78 cells/uL (ref 0–200)
Basophils Relative: 0.8 %
Eosinophils Absolute: 194 cells/uL (ref 15–500)
Eosinophils Relative: 2 %
HCT: 41.9 % (ref 35.0–45.0)
Hemoglobin: 14.2 g/dL (ref 11.7–15.5)
Lymphs Abs: 3735 cells/uL (ref 850–3900)
MCH: 29.6 pg (ref 27.0–33.0)
MCHC: 33.9 g/dL (ref 32.0–36.0)
MCV: 87.3 fL (ref 80.0–100.0)
MPV: 10.8 fL (ref 7.5–12.5)
Monocytes Relative: 5.7 %
Neutro Abs: 5141 cells/uL (ref 1500–7800)
Neutrophils Relative %: 53 %
Platelets: 265 10*3/uL (ref 140–400)
RBC: 4.8 10*6/uL (ref 3.80–5.10)
RDW: 13.9 % (ref 11.0–15.0)
Total Lymphocyte: 38.5 %
WBC: 9.7 10*3/uL (ref 3.8–10.8)

## 2021-10-15 LAB — COMPLETE METABOLIC PANEL WITH GFR
AG Ratio: 1.6 (calc) (ref 1.0–2.5)
ALT: 9 U/L (ref 6–29)
AST: 13 U/L (ref 10–30)
Albumin: 4.2 g/dL (ref 3.6–5.1)
Alkaline phosphatase (APISO): 65 U/L (ref 31–125)
BUN: 11 mg/dL (ref 7–25)
CO2: 23 mmol/L (ref 20–32)
Calcium: 8 mg/dL — ABNORMAL LOW (ref 8.6–10.2)
Chloride: 105 mmol/L (ref 98–110)
Creat: 0.96 mg/dL (ref 0.50–0.97)
Globulin: 2.7 g/dL (calc) (ref 1.9–3.7)
Glucose, Bld: 76 mg/dL (ref 65–99)
Potassium: 4.3 mmol/L (ref 3.5–5.3)
Sodium: 138 mmol/L (ref 135–146)
Total Bilirubin: 0.7 mg/dL (ref 0.2–1.2)
Total Protein: 6.9 g/dL (ref 6.1–8.1)
eGFR: 78 mL/min/{1.73_m2} (ref >=60)

## 2021-10-15 LAB — ANTI-DNA ANTIBODY, DOUBLE-STRANDED: ds DNA Ab: 47 IU/mL — ABNORMAL HIGH

## 2021-10-18 ENCOUNTER — Telehealth: Payer: Self-pay | Admitting: *Deleted

## 2021-10-18 DIAGNOSIS — M329 Systemic lupus erythematosus, unspecified: Secondary | ICD-10-CM

## 2021-10-18 DIAGNOSIS — R002 Palpitations: Secondary | ICD-10-CM

## 2021-10-18 DIAGNOSIS — M7989 Other specified soft tissue disorders: Secondary | ICD-10-CM

## 2021-10-18 NOTE — Telephone Encounter (Signed)
-----   Message from Fuller Plan, MD sent at 10/18/2021  7:55 AM EDT ----- ?Lab results look okay her dsDNA is about the same. Still has mild hypocalcemia like before. I don't think symptoms related to a change in inflammatory activity or medication. If she wants we can refer to cardiology to evaluate with palpitations and i ?ncreased leg swelling. ?

## 2021-10-18 NOTE — Progress Notes (Signed)
Lab results look okay her dsDNA is about the same. Still has mild hypocalcemia like before. I don't think symptoms related to a change in inflammatory activity or medication. If she wants we can refer to cardiology to evaluate with palpitations and increased leg swelling.

## 2021-10-21 ENCOUNTER — Other Ambulatory Visit (HOSPITAL_COMMUNITY): Payer: Self-pay

## 2021-10-25 ENCOUNTER — Encounter: Payer: Self-pay | Admitting: Obstetrics & Gynecology

## 2021-10-25 ENCOUNTER — Other Ambulatory Visit (HOSPITAL_COMMUNITY)
Admission: RE | Admit: 2021-10-25 | Discharge: 2021-10-25 | Disposition: A | Discharge status: Home / Self Care | Payer: Medicaid Other | Source: Ambulatory Visit | Attending: Obstetrics & Gynecology | Admitting: Obstetrics & Gynecology

## 2021-10-25 ENCOUNTER — Ambulatory Visit (INDEPENDENT_AMBULATORY_CARE_PROVIDER_SITE_OTHER): Payer: Medicaid Other | Admitting: Obstetrics & Gynecology

## 2021-10-25 VITALS — BP 118/79 | HR 74 | Ht 67.0 in | Wt 173.2 lb

## 2021-10-25 DIAGNOSIS — R87810 Cervical high risk human papillomavirus (HPV) DNA test positive: Secondary | ICD-10-CM | POA: Insufficient documentation

## 2021-10-25 DIAGNOSIS — Z3202 Encounter for pregnancy test, result negative: Secondary | ICD-10-CM

## 2021-10-25 DIAGNOSIS — N871 Moderate cervical dysplasia: Secondary | ICD-10-CM | POA: Diagnosis not present

## 2021-10-25 DIAGNOSIS — Z01812 Encounter for preprocedural laboratory examination: Secondary | ICD-10-CM

## 2021-10-25 DIAGNOSIS — R87610 Atypical squamous cells of undetermined significance on cytologic smear of cervix (ASC-US): Secondary | ICD-10-CM | POA: Diagnosis not present

## 2021-10-25 DIAGNOSIS — N72 Inflammatory disease of cervix uteri: Secondary | ICD-10-CM | POA: Diagnosis not present

## 2021-10-25 LAB — POCT URINE PREGNANCY: Preg Test, Ur: NEGATIVE

## 2021-10-25 NOTE — Progress Notes (Signed)
? ? ?Patient name: Valerie Ayala MRN [id number]  Date of birth: [birth year] ?Chief Complaint:   ?Colposcopy ? ?History of Present Illness:   ?Valerie Ayala is a 36 y.o. G37P2002 female being seen today for cervical dysplasia management. ? ?Cervical dysplasia:  Records reviewed: ?2023- ASCUS, HPV 16+ ?2017- LSIL, HPV pos ? ?Pt notes h/o colposcopy when she was a teenager. ? ?Smoker:  No. ?New sexual partner:  no- same partner x 53yrs ? ?History of abnormal Pap: yes ?  ?Denies postcoital bleeding.  No menses with Depot.  No acute complaints ? ? ?No LMP recorded (lmp unknown). Patient has had an injection. ? ? ?  09/08/2021  ? 11:00 AM 07/28/2021  ?  1:25 PM 05/27/2021  ?  1:48 PM 02/25/2021  ?  2:47 PM 12/03/2020  ?  2:24 PM  ?Depression screen PHQ 2/9  ?Decreased Interest 1 2 1 1 1   ?Down, Depressed, Hopeless 1 2 1 1 1   ?PHQ - 2 Score 2 4 2 2 2   ?Altered sleeping 3 3 3 1 3   ?Tired, decreased energy 3 3 3 3 3   ?Change in appetite 1 2 2  0 0  ?Feeling bad or failure about yourself  1 2 1 1  0  ?Trouble concentrating 1 1 2  0 1  ?Moving slowly or fidgety/restless 0 0 0 0 0  ?Suicidal thoughts 0 0 0 0 0  ?PHQ-9 Score 11 15 13 7 9   ? ? ? ?Review of Systems:   ?Pertinent items are noted in HPI ?Denies fever/chills, dizziness, headaches, visual disturbances, fatigue, shortness of breath, chest pain, abdominal pain, vomiting, no problems with periods, bowel movements, urination, or intercourse unless otherwise stated above.  ?Pertinent History Reviewed:  ?Reviewed past medical,surgical, social, obstetrical and family history.  ?Reviewed problem list, medications and allergies. ?Physical Assessment:  ? ?Vitals:  ? 10/25/21 1058  ?BP: 118/79  ?Pulse: 74  ?Weight: 173 lb 3.2 oz (78.6 kg)  ?Height: 5\' 7"  (1.702 m)  ?Body mass index is 27.13 kg/m?. ? ?     Physical Examination:  ? General appearance: alert, well appearing, and in no distress ? Psych: mood appropriate, normal affect ? Skin: warm & dry  ? Cardiovascular: normal heart rate  noted ? Respiratory: normal respiratory effort, no distress ? Abdomen: soft, non-tender  ? Pelvic: normal external genitalia, vulva, vagina, cervix- see colposcopy section ? Extremities: no edema  ? ?Chaperone:   ? ? ?Colposcopy Procedure Note ? ?Indications: ASCUS, HPV+ ? ? ?Procedure Details  ?The risks and benefits of the procedure and Written informed consent obtained. ? ?Speculum placed in vagina and excellent visualization of cervix achieved, cervix swabbed x 3 with acetic acid solution. ? ?Findings: ?Adequate colposcopy is noted today.  TMZ zone present ? ?Cervix: acetowhite lesion(s) noted 4-8 o'clock and mosaicism noted at 12-3 o'clock; ECC and cervical biopsies obtained.   ? ?Monsel's applied.  Adequate hemostasis noted ? ?Specimens: ECC and cervical biopsies ? ?Complications: none. ? ?Colposcopic Impression: CIN 1 ? ? ?Plan(Based on 2019 ASCCP recommendations) ? ?-Discussed HPV- reviewed incidence and its potential to cause condylomas to dysplasia to cervical cancer ?-Reviewed degree of abnormal pap smears  ?-Discussed ASCCP guidelines and current recommendations for colposcopy ?-As above, inform consent obtained and procedure completed ?-biopsies obtained, further management pending results ?-Briefly discussed next step via LEEP ?-Questions and concerns were addressed ? ? , DO ?Attending Obstetrician & Gynecologist, Faculty Practice ?Center for , [facility] ? ? ?  ?

## 2021-10-26 LAB — SURGICAL PATHOLOGY

## 2021-10-28 ENCOUNTER — Other Ambulatory Visit (HOSPITAL_COMMUNITY): Payer: Self-pay

## 2021-10-29 ENCOUNTER — Other Ambulatory Visit (HOSPITAL_COMMUNITY): Payer: Self-pay

## 2021-11-14 ENCOUNTER — Encounter: Payer: Self-pay | Admitting: Family Medicine

## 2021-11-15 ENCOUNTER — Ambulatory Visit: Payer: Medicaid Other

## 2021-11-19 ENCOUNTER — Ambulatory Visit: Payer: Medicaid Other | Admitting: Family Medicine

## 2021-11-19 ENCOUNTER — Encounter: Payer: Self-pay | Admitting: Family Medicine

## 2021-11-19 ENCOUNTER — Other Ambulatory Visit (HOSPITAL_COMMUNITY): Payer: Self-pay

## 2021-11-19 VITALS — BP 110/74 | HR 67 | Temp 97.7°F | Ht 67.0 in | Wt 175.0 lb

## 2021-11-19 DIAGNOSIS — F331 Major depressive disorder, recurrent, moderate: Secondary | ICD-10-CM | POA: Diagnosis not present

## 2021-11-19 DIAGNOSIS — F411 Generalized anxiety disorder: Secondary | ICD-10-CM | POA: Diagnosis not present

## 2021-11-19 DIAGNOSIS — E890 Postprocedural hypothyroidism: Secondary | ICD-10-CM

## 2021-11-19 MED ORDER — VORTIOXETINE HBR 10 MG PO TABS: 10.0000 mg | ORAL_TABLET | Freq: Every day | ORAL | 3 refills | Status: DC

## 2021-11-19 NOTE — Progress Notes (Signed)
BP 110/74   Pulse 67   Temp 97.7 F (36.5 C)   Ht 5\' 7"  (1.702 m)   Wt 175 lb (79.4 kg)   LMP  (LMP Unknown)   SpO2 100%   BMI 27.41 kg/m    Subjective:   Patient ID: , female    DOB: 02-09-1986, 36 y.o.   MRN: [id number]  HPI: Valerie Ayala is a 36 y.o. female presenting on 11/19/2021 for Depression   HPI Depression recheck Patient is coming in today for anxiety depression recheck.  She feels like she is just not doing as well on the Prozac is not helping as much.  She would like to try something different and she has read up on the Trintellix and that is what she would like to try.  She denies any suicidal ideations or thoughts of hurting self but just still says she gets a lot of anxiety and fatigue.  With the anxiety and fatigue we will also recheck her thyroid.    11/19/2021   10:15 AM 09/08/2021   11:00 AM 07/28/2021    1:25 PM 05/27/2021    1:48 PM 02/25/2021    2:47 PM  Depression screen PHQ 2/9  Decreased Interest 2 1 2 1 1   Down, Depressed, Hopeless 1 1 2 1 1   PHQ - 2 Score 3 2 4 2 2   Altered sleeping 3 3 3 3 1   Tired, decreased energy 3 3 3 3 3   Change in appetite 1 1 2 2  0  Feeling bad or failure about yourself  1 1 2 1 1   Trouble concentrating 1 1 1 2  0  Moving slowly or fidgety/restless 1 0 0 0 0  Suicidal thoughts 0 0 0 0 0  PHQ-9 Score 13 11 15 13 7   Difficult doing work/chores Very difficult         Relevant past medical, surgical, family and social history reviewed and updated as indicated. Interim medical history since our last visit reviewed. Allergies and medications reviewed and updated.  Review of Systems  Constitutional:  Positive for fatigue. Negative for chills and fever.  Eyes:  Negative for visual disturbance.  Respiratory:  Negative for chest tightness and shortness of breath.   Cardiovascular:  Negative for chest pain and leg swelling.  Skin:  Negative for rash.  Psychiatric/Behavioral:  Positive for dysphoric mood. Negative for  agitation, behavioral problems, self-injury, sleep disturbance and suicidal ideas. The patient is nervous/anxious.   All other systems reviewed and are negative.  Per HPI unless specifically indicated above   Allergies as of 11/19/2021   No Known Allergies      Medication List        Accurate as of Nov 19, 2021 10:47 AM. If you have any questions, ask your nurse or doctor.          STOP taking these medications    FLUoxetine 20 MG capsule Commonly known as: PROZAC Stopped by: Risa Auman, MD       TAKE these medications    albuterol 108 (90 Base) MCG/ACT inhaler Commonly known as: VENTOLIN HFA Inhale 2 puffs into the lungs every 6 (six) hours as needed. For shortness of breath   atorvastatin 40 MG tablet Commonly known as: LIPITOR Take 1 tablet (40 mg total) by mouth at bedtime.   Benlysta 200 MG/ML [place] Generic drug: Belimumab Inject 200 mg into the skin once a week.   cholecalciferol 1000 units tablet Commonly known as: VITAMIN  D Take 1,000 Units by mouth daily.   hydroxychloroquine 200 MG tablet Commonly known as: PLAQUENIL Take 1 AND 1/2 tablets (300 mg total) by mouth daily.   levothyroxine 175 MCG tablet Commonly known as: SYNTHROID Take 1 tablet (175 mcg total) by mouth daily.   levothyroxine 200 MCG tablet Commonly known as: SYNTHROID TAKE ONE TABLET (200 mcg total) BY MOUTH DAILY   medroxyPROGESTERone 150 MG/ML injection Commonly known as: DEPO-PROVERA Inject 1 mL (150 mg total) into the muscle every 3 (three) months.   Pseudoephedrine-Guaifenesin [phone] MG Tb12 Take 1 tablet by mouth 2 (two) times daily. For congestion   Rexulti 1 MG Tabs tablet Generic drug: brexpiprazole Take 1 tablet (1 mg total) by mouth daily.   varenicline 1 MG tablet Commonly known as: CHANTIX Take 1 tablet (1 mg total) by mouth 2 (two) times daily.   vortioxetine HBr 10 MG Tabs tablet Commonly known as: TRINTELLIX Take 1 tablet (10 mg total) by  mouth daily. Started by: Elige RadonJoshua A Sherrell Farish, MD         Objective:   BP 110/74   Pulse 67   Temp 97.7 F (36.5 C)   Ht 5\' 7"  (1.702 m)   Wt 175 lb (79.4 kg)   LMP  (LMP Unknown)   SpO2 100%   BMI 27.41 kg/m   Wt Readings from Last 3 Encounters:  11/19/21 175 lb (79.4 kg)  10/25/21 173 lb 3.2 oz (78.6 kg)  10/14/21 177 lb (80.3 kg)    Physical Exam Vitals and nursing note reviewed.  Constitutional:      General: She is not in acute distress.    Appearance: She is well-developed. She is not diaphoretic.  Eyes:     Conjunctiva/sclera: Conjunctivae normal.  Cardiovascular:     Rate and Rhythm: Normal rate and regular rhythm.     Heart sounds: Normal heart sounds. No murmur heard. Pulmonary:     Effort: Pulmonary effort is normal. No respiratory distress.     Breath sounds: Normal breath sounds. No wheezing.  Skin:    General: Skin is warm and dry.     Findings: No rash.  Neurological:     Mental Status: She is alert and oriented to person, place, and time.     Coordination: Coordination normal.  Psychiatric:        Mood and Affect: Mood is anxious and depressed.        Behavior: Behavior normal.        Thought Content: Thought content does not include suicidal ideation. Thought content does not include suicidal plan.      Assessment & Plan:   Problem List Items Addressed This Visit       Endocrine   Hypothyroidism - Primary   Relevant Orders   TSH     Other   GAD (generalized anxiety disorder)   Relevant Medications   vortioxetine HBr (TRINTELLIX) 10 MG TABS tablet   Major depression, recurrent ([facility]CC)   Relevant Medications   vortioxetine HBr (TRINTELLIX) 10 MG TABS tablet    We will switch to Trintellix, gave her a sample for 2 weeks of Trintellix 5 mg and sent a prescription for Trintellix 10 mg, go ahead and stop the Prozac today and start the Trintellix tomorrow. Follow up plan: Return if symptoms worsen or fail to improve, for 3073-month anxiety  and depression recheck.  Counseling provided for all of the vaccine components Orders Placed This Encounter  Procedures   TSH    Ivin BootyJoshua  Acacia Latorre, MD [facility] 11/19/2021, 10:47 AM

## 2021-11-20 LAB — TSH: TSH: 0.058 u[IU]/mL — ABNORMAL LOW (ref 0.450–4.500)

## 2021-11-23 ENCOUNTER — Ambulatory Visit: Payer: Medicaid Other

## 2021-11-24 ENCOUNTER — Other Ambulatory Visit (HOSPITAL_COMMUNITY): Payer: Self-pay

## 2021-11-25 ENCOUNTER — Ambulatory Visit: Payer: Medicaid Other

## 2021-11-25 DIAGNOSIS — Z3042 Encounter for surveillance of injectable contraceptive: Secondary | ICD-10-CM | POA: Diagnosis not present

## 2021-11-25 LAB — PREGNANCY, URINE: Preg Test, Ur: NEGATIVE

## 2021-11-25 NOTE — Progress Notes (Signed)
Depo given in right upper outer quadrant without difficulty. Pt was due between 5/18-6/1.  Urine preg negative today.  Pt aware to schedule next injection between 8/17-8/31

## 2021-11-29 ENCOUNTER — Other Ambulatory Visit (HOSPITAL_BASED_OUTPATIENT_CLINIC_OR_DEPARTMENT_OTHER): Payer: Self-pay

## 2021-12-09 ENCOUNTER — Ambulatory Visit: Payer: Medicaid Other | Admitting: Cardiology

## 2021-12-09 ENCOUNTER — Encounter: Payer: Self-pay | Admitting: Cardiology

## 2021-12-09 VITALS — BP 100/72 | HR 79 | Ht 67.0 in | Wt 177.0 lb

## 2021-12-09 DIAGNOSIS — R002 Palpitations: Secondary | ICD-10-CM | POA: Diagnosis not present

## 2021-12-09 DIAGNOSIS — R0602 Shortness of breath: Secondary | ICD-10-CM

## 2021-12-09 DIAGNOSIS — R600 Localized edema: Secondary | ICD-10-CM | POA: Diagnosis not present

## 2021-12-09 NOTE — Progress Notes (Signed)
Clinical Summary Valerie Ayala is a 36 y.o.female seen today as a new consult, referred by Dr Dimple Casey for the following medical problems.   Palpitations -started about 1 year ago - racing/pounding with activities.  - high caffeine intake 8 mellow yellows, no coffee, no tea, no energy drinks, no etoh - recent TSH 0.058  2. Leg edema - started about 2 years 2022 venous reflux [place] left leg only no DVt, +venous reflux - bilateral leg, abdominal distension - occasional SOB with activities, some fatigue.  - some occasional SOB with laying flat at night.   3. SLE - followed by rheum  4. Hisotry of hyperthyroid - prior thyroidectomy - last TSH 0.058   5. Hypersomnolence  6. Dizziness - occurs with standing often - mainly drinks sodas during the day  SH: has 2 sons 27 and 16 Past Medical History:  Diagnosis Date   Anxiety    Takes xanax twice daily   ASCUS with positive high risk HPV cervical 09/15/2021   08/2021 +HPV 16/0ther ASCUS, needs colpo, immediate risk CIN3+ 9%   Asthma    Chronic headaches    "multiple times a day; they say it's related to thyroid"   Complication of anesthesia    Dysrhythmia    tachycardia   Grave's disease    H/O seasonal allergies    Headache(784.0)    most days   Pneumonia 11/02/2011   "years ago"   PONV (postoperative nausea and vomiting)    Systemic lupus erythematosus (HCC)    Thyroid disease      No Known Allergies   Current Outpatient Medications  Medication Sig Dispense Refill   albuterol (PROVENTIL HFA;VENTOLIN HFA) 108 (90 BASE) MCG/ACT inhaler Inhale 2 puffs into the lungs every 6 (six) hours as needed. For shortness of breath     atorvastatin (LIPITOR) 40 MG tablet Take 1 tablet (40 mg total) by mouth at bedtime. 90 tablet 3   Belimumab (BENLYSTA) 200 MG/ML SOAJ Inject 200 mg into the skin once a week. 4 mL 2   cholecalciferol (VITAMIN D) 1000 UNITS tablet Take 1,000 Units by mouth daily.      hydroxychloroquine (PLAQUENIL)  200 MG tablet Take 1 AND 1/2 tablets (300 mg total) by mouth daily. 135 tablet 0   levothyroxine (SYNTHROID) 175 MCG tablet Take 1 tablet (175 mcg total) by mouth daily. 90 tablet 3   levothyroxine (SYNTHROID) 200 MCG tablet TAKE ONE TABLET (200 mcg total) BY MOUTH DAILY 45 tablet 3   medroxyPROGESTERone (DEPO-PROVERA) 150 MG/ML injection Inject 1 mL (150 mg total) into the muscle every 3 (three) months. 1 mL 3   Pseudoephedrine-Guaifenesin [phone] MG TB12 Take 1 tablet by mouth 2 (two) times daily. For congestion 12 tablet 0   REXULTI 1 MG TABS tablet Take 1 tablet (1 mg total) by mouth daily. 30 tablet 0   varenicline (CHANTIX) 1 MG tablet Take 1 tablet (1 mg total) by mouth 2 (two) times daily. 56 tablet 1   vortioxetine HBr (TRINTELLIX) 10 MG TABS tablet Take 1 tablet (10 mg total) by mouth daily. 30 tablet 3   Current Facility-Administered Medications  Medication Dose Route Frequency Provider Last Rate Last Admin   medroxyPROGESTERone (DEPO-PROVERA) injection 150 mg  150 mg Intramuscular Q90 days Dettinger, Elige Radon, MD   150 mg at 11/25/21 1536     Past Surgical History:  Procedure Laterality Date   THYROIDECTOMY  11/02/2011   Procedure: THYROIDECTOMY;  Surgeon: Serena Colonel, MD;  Location:  [facility];  Service: ENT;  Laterality: Bilateral;  TOTAL THYROIDECTOMY   TOTAL THYROIDECTOMY  11/02/11   WISDOM TOOTH EXTRACTION  2004     No Known Allergies    Family History  Problem Relation Age of Onset   Hypertension Father    Hyperlipidemia Father    Hypothyroidism Father    Hypertension Mother    Hyperlipidemia Mother    Diabetes Mother    Healthy Sister    Anesthesia problems Neg Hx    Hypotension Neg Hx    Malignant hyperthermia Neg Hx    Pseudochol deficiency Neg Hx      Social History Valerie Ayala reports that she quit smoking about 7 months ago. Her smoking use included cigarettes. She has a 12.00 pack-year smoking history. She has been exposed to tobacco smoke. She has never  used smokeless tobacco. Valerie Ayala reports that she does not currently use alcohol.   Review of Systems CONSTITUTIONAL: No weight loss, fever, chills, weakness or fatigue.  HEENT: Eyes: No visual loss, blurred vision, double vision or yellow sclerae.No hearing loss, sneezing, congestion, runny nose or sore throat.  SKIN: No rash or itching.  CARDIOVASCULAR: per hpi RESPIRATORY: No shortness of breath, cough or sputum.  GASTROINTESTINAL: No anorexia, nausea, vomiting or diarrhea. No abdominal pain or blood.  GENITOURINARY: No burning on urination, no polyuria NEUROLOGICAL: No headache, dizziness, syncope, paralysis, ataxia, numbness or tingling in the extremities. No change in bowel or bladder control.  MUSCULOSKELETAL: No muscle, back pain, joint pain or stiffness.  LYMPHATICS: No enlarged nodes. No history of splenectomy.  PSYCHIATRIC: No history of depression or anxiety.  ENDOCRINOLOGIC: No reports of sweating, cold or heat intolerance. No polyuria or polydipsia.  Marland Kitchen   Physical Examination Today's Vitals   12/09/21 1314  BP: 100/72  Pulse: 79  SpO2: 99%  Weight: 177 lb (80.3 kg)  Height: 5\' 7"  (1.702 m)   Body mass index is 27.72 kg/m.  Gen: resting comfortably, no acute distress HEENT: no scleral icterus, pupils equal round and reactive, no palptable cervical adenopathy,  CV: RRR, no m/r/g no jvd Resp: Clear to auscultation bilaterally GI: abdomen is soft, non-tender, non-distended, normal bowel sounds, no hepatosplenomegaly MSK: extremities are warm, no edema.  Skin: warm, no rash Neuro:  no focal deficits Psych: appropriate affect     Assessment and Plan  1.Palpitations - likely related to hyperthyroid state, high caffeien intake - thyroid management per pcp, discussed weaning caffeine - dont' see strong role for monitor at this time as even if arrhythmia was detected first step would be to get thyroid under control - low normal bp's today, don't think would  tolerate low dose beta blocker or CCB - EKG today shows sinus tach 108  2. Leg edema/SOB - will obtain echo  3. Dizziness -orthostatic dizziness. Poor oral hydration, mainly drinks sodas during the day. Encouraged increased water intake   F/u 6 months    , M.D.,

## 2021-12-09 NOTE — Patient Instructions (Signed)

## 2021-12-14 ENCOUNTER — Other Ambulatory Visit (HOSPITAL_COMMUNITY): Payer: Self-pay

## 2021-12-14 ENCOUNTER — Other Ambulatory Visit: Payer: Self-pay | Admitting: Internal Medicine

## 2021-12-14 DIAGNOSIS — M329 Systemic lupus erythematosus, unspecified: Secondary | ICD-10-CM

## 2021-12-14 MED ORDER — BENLYSTA 200 MG/ML ~~LOC~~ SOAJ
200.0000 mg | SUBCUTANEOUS | 2 refills | Status: DC
  Filled 2021-12-14: qty 4, 28d supply, fill #0
  Filled 2022-01-11: qty 4, 28d supply, fill #1

## 2021-12-14 NOTE — Telephone Encounter (Signed)
Next Visit: 01/31/2022  Last Visit: 10/14/2021  Last Fill: 09/28/2021  DX: Systemic lupus erythematosus, unspecified SLE type, unspecified organ involvement status   Current Dose per office note 10/14/2021: Benlysta 200 mg subcu weekly  Labs: 10/14/2021 Lab results look okay her dsDNA is about the same. Still has mild hypocalcemia like before  TB Gold: 12/08/2020 Neg    Okay to refill Benlysta?

## 2021-12-21 ENCOUNTER — Other Ambulatory Visit (HOSPITAL_COMMUNITY): Payer: Self-pay

## 2021-12-22 ENCOUNTER — Other Ambulatory Visit (HOSPITAL_COMMUNITY): Payer: Self-pay

## 2021-12-23 ENCOUNTER — Ambulatory Visit (INDEPENDENT_AMBULATORY_CARE_PROVIDER_SITE_OTHER): Payer: Medicaid Other

## 2021-12-23 DIAGNOSIS — R0602 Shortness of breath: Secondary | ICD-10-CM

## 2021-12-23 LAB — ECHOCARDIOGRAM COMPLETE
Area-P 1/2: 3.06 cm2
Calc EF: 65.1 %
MV M vel: 2.75 m/s
MV Peak grad: 30.3 mmHg
S' Lateral: 2.65 cm
Single Plane A2C EF: 66.4 %
Single Plane A4C EF: 64.4 %

## 2021-12-30 ENCOUNTER — Other Ambulatory Visit: Payer: Self-pay | Admitting: Family Medicine

## 2021-12-30 ENCOUNTER — Other Ambulatory Visit: Payer: Self-pay | Admitting: Internal Medicine

## 2021-12-30 DIAGNOSIS — M2550 Pain in unspecified joint: Secondary | ICD-10-CM

## 2021-12-30 DIAGNOSIS — F411 Generalized anxiety disorder: Secondary | ICD-10-CM

## 2021-12-30 DIAGNOSIS — F331 Major depressive disorder, recurrent, moderate: Secondary | ICD-10-CM

## 2021-12-30 DIAGNOSIS — R768 Other specified abnormal immunological findings in serum: Secondary | ICD-10-CM

## 2021-12-31 ENCOUNTER — Telehealth: Payer: Self-pay | Admitting: Pharmacist

## 2021-12-31 NOTE — Telephone Encounter (Signed)
Received notification from  [facility]  regarding a prior authorization for BENLYSTA. Authorization has been APPROVED from 12/31/21 to 12/31/22.   Patient can continue to fill through [facility]: [phone]   Chesley Mires, PharmD, MPH, BCPS, CPP Clinical Pharmacist (Rheumatology and Pulmonology)

## 2021-12-31 NOTE — Telephone Encounter (Signed)
Submitted a Prior Authorization RENEWAL request to  [facility]  for BENLYSTA via CoverMyMeds. Will update once we receive a response.  Key: [id number]  Chesley Mires, PharmD, MPH, BCPS, CPP Clinical Pharmacist (Rheumatology and Pulmonology)

## 2021-12-31 NOTE — Telephone Encounter (Signed)
Next Visit: 01/31/2022   Last Visit: 10/14/2021   Last Fill: 09/30/2021  DX: Systemic lupus erythematosus, unspecified SLE type, unspecified organ involvement status    Current Dose per office note 10/14/2021: hydroxychloroquine 300 mg daily   Labs: 10/14/2021 Lab results look okay her dsDNA is about the same. Still has mild hypocalcemia like before   No baseline PLQ eye exam on file.  Patient states she will call and get that scheduled for as soon as possible.   Okay to refill PLQ?

## 2022-01-06 ENCOUNTER — Other Ambulatory Visit (HOSPITAL_COMMUNITY): Payer: Self-pay

## 2022-01-11 ENCOUNTER — Other Ambulatory Visit (HOSPITAL_COMMUNITY): Payer: Self-pay

## 2022-01-14 ENCOUNTER — Encounter: Payer: Self-pay | Admitting: *Deleted

## 2022-01-19 ENCOUNTER — Other Ambulatory Visit (HOSPITAL_COMMUNITY): Payer: Self-pay

## 2022-01-24 ENCOUNTER — Ambulatory Visit: Payer: Medicaid Other | Admitting: Family Medicine

## 2022-01-24 ENCOUNTER — Encounter: Payer: Self-pay | Admitting: Family Medicine

## 2022-01-24 ENCOUNTER — Ambulatory Visit (INDEPENDENT_AMBULATORY_CARE_PROVIDER_SITE_OTHER): Payer: Medicaid Other | Admitting: Family Medicine

## 2022-01-24 VITALS — BP 124/95 | HR 143 | Ht 67.0 in | Wt 167.0 lb

## 2022-01-24 DIAGNOSIS — F331 Major depressive disorder, recurrent, moderate: Secondary | ICD-10-CM

## 2022-01-24 DIAGNOSIS — E890 Postprocedural hypothyroidism: Secondary | ICD-10-CM | POA: Diagnosis not present

## 2022-01-24 DIAGNOSIS — F411 Generalized anxiety disorder: Secondary | ICD-10-CM | POA: Diagnosis not present

## 2022-01-24 MED ORDER — BREXPIPRAZOLE 2 MG PO TABS
2.0000 mg | ORAL_TABLET | Freq: Every day | ORAL | 1 refills | Status: DC
Start: 2022-01-24 — End: 2022-05-02

## 2022-01-24 MED ORDER — LEVOTHYROXINE SODIUM 150 MCG PO TABS: 150.0000 ug | ORAL_TABLET | Freq: Every day | ORAL | 1 refills | Status: DC

## 2022-01-24 NOTE — Progress Notes (Signed)
BP (!) 124/95   Pulse (!) 143   Ht 5\' 7"  (1.702 m)   Wt 167 lb (75.8 kg)   SpO2 99%   BMI 26.16 kg/m    Subjective:   Patient ID: , female    DOB: 19-Jun-1986, 36 y.o.   MRN: [id number]  HPI: Valerie Ayala is a 36 y.o. female presenting on 01/24/2022 for Medical Management of Chronic Issues and Anxiety   HPI Anxiety depression recheck Patient is coming in today for anxiety and depression recheck.  She is currently taking Rexulti and Trintellix.  She feels like she is doing well but would like to increase.  She denies any suicidal ideations but she does feel like she just needs a little bit more to help her.  She feels like things are better and would just look forward to having a little more improvement.  She says the only real side effect she has it is from the Trintellix she does have some nausea and vomiting occasionally.    11/19/2021   10:15 AM 09/08/2021   11:00 AM 07/28/2021    1:25 PM 05/27/2021    1:48 PM 02/25/2021    2:47 PM  Depression screen PHQ 2/9  Decreased Interest 2 1 2 1 1   Down, Depressed, Hopeless 1 1 2 1 1   PHQ - 2 Score 3 2 4 2 2   Altered sleeping 3 3 3 3 1   Tired, decreased energy 3 3 3 3 3   Change in appetite 1 1 2 2  0  Feeling bad or failure about yourself  1 1 2 1 1   Trouble concentrating 1 1 1 2  0  Moving slowly or fidgety/restless 1 0 0 0 0  Suicidal thoughts 0 0 0 0 0  PHQ-9 Score 13 11 15 13 7   Difficult doing work/chores Very difficult         Relevant past medical, surgical, family and social history reviewed and updated as indicated. Interim medical history since our last visit reviewed. Allergies and medications reviewed and updated.  Review of Systems  Constitutional:  Negative for chills and fever.  Eyes:  Negative for visual disturbance.  Respiratory:  Negative for chest tightness and shortness of breath.   Cardiovascular:  Negative for chest pain and leg swelling.  Musculoskeletal:  Negative for back pain and gait problem.   Skin:  Negative for rash.  Neurological:  Negative for dizziness, light-headedness and headaches.  Psychiatric/Behavioral:  Negative for agitation and behavioral problems.   All other systems reviewed and are negative.   Per HPI unless specifically indicated above   Allergies as of 01/24/2022   No Known Allergies      Medication List        Accurate as of January 24, 2022  4:14 PM. If you have any questions, ask your nurse or doctor.          albuterol 108 (90 Base) MCG/ACT inhaler Commonly known as: VENTOLIN HFA Inhale 2 puffs into the lungs every 6 (six) hours as needed. For shortness of breath   atorvastatin 40 MG tablet Commonly known as: LIPITOR Take 1 tablet (40 mg total) by mouth at bedtime.   Benlysta 200 MG/ML Soaj Generic drug: Belimumab Inject 200 mg into the skin once a week.   brexpiprazole 2 MG Tabs tablet Commonly known as: Rexulti Take 1 tablet (2 mg total) by mouth daily. What changed:  medication strength how much to take Changed by: , MD  cholecalciferol 1000 units tablet Commonly known as: VITAMIN D Take 1,000 Units by mouth daily.   hydroxychloroquine 200 MG tablet Commonly known as: PLAQUENIL Take 1 AND 1/2 tablets (300 mg total) by mouth daily.   levothyroxine 150 MCG tablet Commonly known as: SYNTHROID Take 1 tablet (150 mcg total) by mouth daily. What changed:  medication strength how much to take Changed by: Nils Pyle, MD   medroxyPROGESTERone 150 MG/ML injection Commonly known as: DEPO-PROVERA Inject 1 mL (150 mg total) into the muscle every 3 (three) months.   Pseudoephedrine-Guaifenesin [phone] MG Tb12 Take 1 tablet by mouth 2 (two) times daily. For congestion   varenicline 1 MG tablet Commonly known as: CHANTIX Take 1 tablet (1 mg total) by mouth 2 (two) times daily.   vortioxetine HBr 10 MG Tabs tablet Commonly known as: TRINTELLIX Take 1 tablet (10 mg total) by mouth daily.          Objective:   BP (!) 124/95   Pulse (!) 143   Ht 5\' 7"  (1.702 m)   Wt 167 lb (75.8 kg)   SpO2 99%   BMI 26.16 kg/m   Wt Readings from Last 3 Encounters:  01/24/22 167 lb (75.8 kg)  12/09/21 177 lb (80.3 kg)  11/19/21 175 lb (79.4 kg)    Physical Exam Vitals and nursing note reviewed.  Constitutional:      General: She is not in acute distress.    Appearance: She is well-developed. She is not diaphoretic.  Eyes:     Conjunctiva/sclera: Conjunctivae normal.  Cardiovascular:     Rate and Rhythm: Normal rate and regular rhythm.     Heart sounds: Normal heart sounds. No murmur heard. Pulmonary:     Effort: Pulmonary effort is normal. No respiratory distress.     Breath sounds: Normal breath sounds. No wheezing.  Musculoskeletal:        General: No swelling or tenderness. Normal range of motion.  Skin:    General: Skin is warm and dry.     Findings: No rash.  Neurological:     Mental Status: She is alert and oriented to person, place, and time.     Coordination: Coordination normal.  Psychiatric:        Behavior: Behavior normal.       Assessment & Plan:   Problem List Items Addressed This Visit       Endocrine   Hypothyroidism   Relevant Medications   levothyroxine (SYNTHROID) 150 MCG tablet     Other   GAD (generalized anxiety disorder) - Primary   Relevant Medications   brexpiprazole (REXULTI) 2 MG TABS tablet   Major depression, recurrent (HCC)   Relevant Medications   brexpiprazole (REXULTI) 2 MG TABS tablet  Increase Rexulti to 2 mg, will hold off on increasing the Trintellix because of her GI side effects and we will see how she does.  Return in a couple months  Follow up plan: Return in about 2 months (around 03/26/2022), or if symptoms worsen or fail to improve, for Anxiety depression and hypothyroidism recheck.  Counseling provided for all of the vaccine components No orders of the defined types were placed in this encounter.   03/28/2022, MD [facility] 01/24/2022, 4:14 PM

## 2022-01-24 NOTE — Progress Notes (Deleted)
Office Visit Note  Patient: Valerie Ayala             Date of Birth: Oct 05, 1985           MRN: [id number]             PCP: Dettinger, Fransisca Kaufmann, MD Referring: Dettinger, Fransisca Kaufmann, MD Visit Date: 01/31/2022   Subjective:  No chief complaint on file.   History of Present Illness: Valerie Ayala is a 36 y.o. female here for follow up ***   Previous HPI  10/14/2021 Valerie Ayala is a 36 y.o. female here for follow up for SLE on HCQ 300 mg daily and Benlysta 200 mg [place] weekly. At last visit significant worsening in lower extremity swelling but normal lab markers and negative for proteinuria.  Since the last visit he continues to do very poorly.  She has ongoing leg swelling itching sometimes there is redness discoloration.  Newer problem is feeling some proximal leg weakness occasional sense of her legs giving out or locking up when walking.  She is extremely fatigued almost every day.  Sometimes she cannot even get out of bed for hours and her mood is very poor due to decreased ability to do anything.  She is also been noticing some palpitations and lightheadedness getting short of breath with very brief or mild exertion.     Previous HPI 06/15/21 Valerie Ayala is a 36 y.o. female here for follow up for SLE on HCQ 300 mg daily and Benlysta 200 mg [place] weekly.  She was doing very well after our last visit until about a month ago now feels everything is horrible.  She has significant bilateral leg swelling with pitting edema developing increased tenderness and painful burning sensation with discolorations and skin changes in both legs.  She feels very fatigued.  Some skin dryness and peeling on the hands but no significant erythematous rash outside of the lower legs.  She recently also had flu with generalized body pains prescribed very large anterior cervical adenopathy.  This improved though she still has a residual ulcer in the mouth. Labs earlier this month showing very low TSH level with recent slight  downward adjustment of her thyroid replacement.  She quit smoking with use of Chantix.   Previous HPI 12/08/20 Valerie Ayala is a 36 y.o. female here for follow up for systemic lupus on hydroxychloroquine 300 mg p.o. daily. No signfiicant improvement since starting hydroxychloroquine 8 weeks ago.  She has not yet seen ophthalmology for retinal exam but plans to do so. Leg swelling continues to come and go, residual erythema and pain and burning sensations.  She continues to feel extremely fatigued as the most noticeable symptom.  She is also had a few mouth sores headaches and swollen glands but also describes the ongoing leg swelling sometimes with pain and discoloration.  Currently her left distal leg is most affected today.   Lupus manifestations Fatigue Arthralgias Photosensitive rash Oral ulcers Raynaud's   Lupus serology ANA 1:80 speckled dsDNA 49 Complement C3, C4 wnl Vit D 19  No Rheumatology ROS completed.   PMFS History:  Patient Active Problem List   Diagnosis Date Noted   ASCUS with positive high risk HPV cervical 09/15/2021   History of abnormal cervical Pap smear 09/08/2021   Encounter for gynecological examination with Papanicolaou smear of cervix 09/08/2021   Routine general medical examination at a health care facility 09/08/2021   Swelling of lower extremity 12/08/2020  High risk medication use 12/08/2020   Hyperlipidemia 12/03/2020   Hypocalcemia 09/07/2020   Vitamin D deficiency 09/07/2020   Systemic lupus ([place]) 08/21/2020   Dry mouth 08/21/2020   Tobacco use disorder 08/21/2020   Arthralgia 08/21/2020   Raynaud's phenomenon 08/21/2020   Major depression, recurrent ([place]) 04/26/2018   Adult ADHD 12/16/2014   Asthma 05/07/2013   Hypothyroidism 11/26/2012   GAD (generalized anxiety disorder) 11/26/2012    Past Medical History:  Diagnosis Date   Anxiety    Takes xanax twice daily   ASCUS with positive high risk HPV cervical 09/15/2021   08/2021 +HPV  16/0ther ASCUS, needs colpo, immediate risk CIN3+ 9%   Asthma    Chronic headaches    "multiple times a day; they say it's related to thyroid"   Complication of anesthesia    Dysrhythmia    tachycardia   Grave's disease    H/O seasonal allergies    Headache(784.0)    most days   Pneumonia 11/02/2011   "years ago"   PONV (postoperative nausea and vomiting)    Systemic lupus erythematosus ([place])    Thyroid disease     Family History  Problem Relation Age of Onset   Hypertension Father    Hyperlipidemia Father    Hypothyroidism Father    Hypertension Mother    Hyperlipidemia Mother    Diabetes Mother    Healthy Sister    Anesthesia problems Neg Hx    Hypotension Neg Hx    Malignant hyperthermia Neg Hx    Pseudochol deficiency Neg Hx    Past Surgical History:  Procedure Laterality Date   THYROIDECTOMY  11/02/2011   Procedure: THYROIDECTOMY;  Surgeon: Izora Gala, MD;  Location: [place];  Service: ENT;  Laterality: Bilateral;  TOTAL THYROIDECTOMY   TOTAL THYROIDECTOMY  11/02/11   WISDOM TOOTH EXTRACTION  2004   Social History   Social History Narrative   Not on file   Immunization History  Administered Date(s) Administered   DTaP 08/24/1984, 11/05/1984, 12/31/1984, 12/24/1985   Hepatitis B 04/26/1996, 05/31/1996, 08/30/1996   IPV 08/24/1984, 11/05/1984, 12/24/1985   Influenza,inj,Quad PF,6+ Mos 05/25/2020, 05/27/2021   Influenza-Unspecified 06/06/2011, 04/25/2012   MMR 12/24/1985   PPD Test 03/16/2015, 03/23/2015   Pneumococcal Polysaccharide-23 11/03/2011   Tdap 12/10/2010, 07/28/2021     Objective: Vital Signs: There were no vitals taken for this visit.   Physical Exam   Musculoskeletal Exam: ***  CDAI Exam: CDAI Score: -- Patient Global: --; Provider Global: -- Swollen: --; Tender: -- Joint Exam 01/31/2022   No joint exam has been documented for this visit   There is currently no information documented on the homunculus. Go to the Rheumatology activity  and complete the homunculus joint exam.  Investigation: No additional findings.  Imaging: No results found.  Recent Labs: Lab Results  Component Value Date   WBC 9.7 10/14/2021   HGB 14.2 10/14/2021   PLT 265 10/14/2021   NA 138 10/14/2021   K 4.3 10/14/2021   CL 105 10/14/2021   CO2 23 10/14/2021   GLUCOSE 76 10/14/2021   BUN 11 10/14/2021   CREATININE 0.96 10/14/2021   BILITOT 0.7 10/14/2021   ALKPHOS 73 05/27/2021   AST 13 10/14/2021   ALT 9 10/14/2021   PROT 6.9 10/14/2021   ALBUMIN 4.4 05/27/2021   CALCIUM 8.0 (L) 10/14/2021   GFRAA 80 02/27/2020   QFTBGOLDPLUS NEGATIVE 12/08/2020    Speciality Comments: PLQ eye exam has not been done yet Benlysta started 01/28/21  Procedures:  No procedures performed Allergies: Patient has no known allergies.   Assessment / Plan:     Visit Diagnoses: No diagnosis found.  ***  Orders: No orders of the defined types were placed in this encounter.  No orders of the defined types were placed in this encounter.    Follow-Up Instructions: No follow-ups on file.   Earnestine Mealing, CMA  Note - This record has been created using Editor, commissioning.  Chart creation errors have been sought, but may not always  have been located. Such creation errors do not reflect on  the standard of medical care.

## 2022-01-26 ENCOUNTER — Other Ambulatory Visit: Payer: Self-pay | Admitting: Pharmacist

## 2022-01-26 ENCOUNTER — Other Ambulatory Visit (HOSPITAL_COMMUNITY): Payer: Self-pay

## 2022-01-26 DIAGNOSIS — M329 Systemic lupus erythematosus, unspecified: Secondary | ICD-10-CM

## 2022-01-26 MED ORDER — BENLYSTA 200 MG/ML ~~LOC~~ SOAJ
200.0000 mg | SUBCUTANEOUS | 0 refills | Status: DC
  Filled 2022-01-26 – 2022-02-16 (×3): qty 4, 28d supply, fill #0

## 2022-01-26 NOTE — Telephone Encounter (Signed)
Rx for Benlysta 200mg SQ every 7 days sent to [facility] for hospital-based cost pricing.   Total qty remaining is 4 ml  Loann Chahal, PharmD, MPH, BCPS, CPP Clinical Pharmacist (Rheumatology and Pulmonology) 

## 2022-01-31 ENCOUNTER — Ambulatory Visit: Payer: Medicaid Other | Admitting: Internal Medicine

## 2022-01-31 DIAGNOSIS — F331 Major depressive disorder, recurrent, moderate: Secondary | ICD-10-CM

## 2022-01-31 DIAGNOSIS — M329 Systemic lupus erythematosus, unspecified: Secondary | ICD-10-CM

## 2022-01-31 DIAGNOSIS — E559 Vitamin D deficiency, unspecified: Secondary | ICD-10-CM

## 2022-01-31 DIAGNOSIS — Z79899 Other long term (current) drug therapy: Secondary | ICD-10-CM

## 2022-02-08 ENCOUNTER — Other Ambulatory Visit (HOSPITAL_COMMUNITY): Payer: Self-pay

## 2022-02-16 ENCOUNTER — Other Ambulatory Visit (HOSPITAL_COMMUNITY): Payer: Self-pay

## 2022-02-17 ENCOUNTER — Other Ambulatory Visit (HOSPITAL_COMMUNITY): Payer: Self-pay

## 2022-02-18 ENCOUNTER — Other Ambulatory Visit: Payer: Self-pay | Admitting: Family Medicine

## 2022-02-18 ENCOUNTER — Ambulatory Visit: Payer: Medicaid Other

## 2022-02-18 DIAGNOSIS — Z3009 Encounter for other general counseling and advice on contraception: Secondary | ICD-10-CM

## 2022-02-22 ENCOUNTER — Ambulatory Visit: Payer: Medicaid Other

## 2022-02-22 ENCOUNTER — Ambulatory Visit (INDEPENDENT_AMBULATORY_CARE_PROVIDER_SITE_OTHER): Payer: Medicaid Other | Admitting: *Deleted

## 2022-02-22 DIAGNOSIS — Z3042 Encounter for surveillance of injectable contraceptive: Secondary | ICD-10-CM | POA: Diagnosis not present

## 2022-03-08 ENCOUNTER — Other Ambulatory Visit (HOSPITAL_COMMUNITY): Payer: Self-pay

## 2022-03-09 ENCOUNTER — Other Ambulatory Visit (HOSPITAL_COMMUNITY): Payer: Self-pay

## 2022-03-09 ENCOUNTER — Other Ambulatory Visit: Payer: Self-pay | Admitting: Internal Medicine

## 2022-03-09 DIAGNOSIS — M329 Systemic lupus erythematosus, unspecified: Secondary | ICD-10-CM

## 2022-03-09 NOTE — Telephone Encounter (Signed)
Next Visit: 03/22/2022  Last Visit: 10/14/2021  Last Fill: 01/26/2022  NV:BTYOMAYO lupus erythematosus, unspecified SLE type, unspecified organ involvement status   Current Dose per office note 10/14/2021: Benlysta 200 mg subcu weekly  Labs: 10/18/2021 Lab results look okay her dsDNA is about the same. Still has mild hypocalcemia like before. I don't think symptoms related to a change in inflammatory activity or medication. If she wants we can refer to cardiology to evaluate with palpitations and increased leg swelling.  TB Gold: 12/08/2021   Okay to refill Benlysta?

## 2022-03-10 MED ORDER — BENLYSTA 200 MG/ML ~~LOC~~ SOAJ
200.0000 mg | SUBCUTANEOUS | 3 refills | Status: DC
  Filled 2022-03-10: qty 4, 28d supply, fill #0

## 2022-03-10 NOTE — Progress Notes (Unsigned)
Office Visit Note  Patient: Valerie Ayala             Date of Birth: Nov 28, 1985           MRN: [id number]             PCP: Dettinger, Fransisca Kaufmann, MD Referring: Dettinger, Fransisca Kaufmann, MD Visit Date: 03/22/2022   Subjective:  Follow-up (Joint pain and fatigue. )   History of Present Illness: Valerie Ayala is a 35 y.o. female here for follow up for SLE on HCQ 300 mg daily and benlysta 200 mg [place] weekly. She is currently feeling worse pretty much all over. Especially joint pains and fatigue and increased skin rashes. She had an upper respiratory illness recently she recovered since about a week ago but has other symptoms are all worse since then. Leg swelling is doing okay and no new mouth and nose ulcers.  Previous HPI 10/14/2021 Valerie Ayala is a 36 y.o. female here for follow up for SLE on HCQ 300 mg daily and Benlysta 200 mg [place] weekly. At last visit significant worsening in lower extremity swelling but normal lab markers and negative for proteinuria.  Since the last visit he continues to do very poorly.  She has ongoing leg swelling itching sometimes there is redness discoloration.  Newer problem is feeling some proximal leg weakness occasional sense of her legs giving out or locking up when walking.  She is extremely fatigued almost every day.  Sometimes she cannot even get out of bed for hours and her mood is very poor due to decreased ability to do anything.  She is also been noticing some palpitations and lightheadedness getting short of breath with very brief or mild exertion.     Previous HPI 06/15/21 Valerie Ayala is a 36 y.o. female here for follow up for SLE on HCQ 300 mg daily and Benlysta 200 mg [place] weekly.  She was doing very well after our last visit until about a month ago now feels everything is horrible.  She has significant bilateral leg swelling with pitting edema developing increased tenderness and painful burning sensation with discolorations and skin changes in both legs.  She feels  very fatigued.  Some skin dryness and peeling on the hands but no significant erythematous rash outside of the lower legs.  She recently also had flu with generalized body pains prescribed very large anterior cervical adenopathy.  This improved though she still has a residual ulcer in the mouth. Labs earlier this month showing very low TSH level with recent slight downward adjustment of her thyroid replacement.  She quit smoking with use of Chantix.   Previous HPI 12/08/20 Valerie Ayala is a 36 y.o. female here for follow up for systemic lupus on hydroxychloroquine 300 mg p.o. daily. No signfiicant improvement since starting hydroxychloroquine 8 weeks ago.  She has not yet seen ophthalmology for retinal exam but plans to do so. Leg swelling continues to come and go, residual erythema and pain and burning sensations.  She continues to feel extremely fatigued as the most noticeable symptom.  She is also had a few mouth sores headaches and swollen glands but also describes the ongoing leg swelling sometimes with pain and discoloration.  Currently her left distal leg is most affected today.   Lupus manifestations Fatigue Arthralgias Photosensitive rash Oral ulcers Raynaud's   Lupus serology ANA 1:80 speckled dsDNA 49 Complement C3, C4 wnl Vit D 19   Review of Systems  Constitutional:  Positive for fatigue.  HENT:  Positive for mouth dryness. Negative for mouth sores.   Eyes:  Positive for dryness.  Respiratory:  Negative for shortness of breath.   Cardiovascular:  Negative for chest pain and palpitations.  Gastrointestinal:  Positive for constipation. Negative for blood in stool and diarrhea.  Endocrine: Negative for increased urination.  Genitourinary:  Negative for involuntary urination.  Musculoskeletal:  Positive for joint pain, joint pain, joint swelling, myalgias, muscle weakness, morning stiffness, muscle tenderness and myalgias. Negative for gait problem.  Skin:  Positive for color  change, rash, hair loss and sensitivity to sunlight.  Allergic/Immunologic: Positive for susceptible to infections.  Neurological:  Positive for dizziness and headaches.  Hematological:  Positive for swollen glands.  Psychiatric/Behavioral:  Positive for depressed mood. Negative for sleep disturbance. The patient is nervous/anxious.     PMFS History:  Patient Active Problem List   Diagnosis Date Noted   ASCUS with positive high risk HPV cervical 09/15/2021   History of abnormal cervical Pap smear 09/08/2021   Encounter for gynecological examination with Papanicolaou smear of cervix 09/08/2021   Routine general medical examination at a health care facility 09/08/2021   Swelling of lower extremity 12/08/2020   High risk medication use 12/08/2020   Hyperlipidemia 12/03/2020   Hypocalcemia 09/07/2020   Vitamin D deficiency 09/07/2020   Systemic lupus ([place]) 08/21/2020   Dry mouth 08/21/2020   Tobacco use disorder 08/21/2020   Arthralgia 08/21/2020   Raynaud's phenomenon 08/21/2020   Major depression, recurrent ([place]) 04/26/2018   Adult ADHD 12/16/2014   Asthma 05/07/2013   Hypothyroidism 11/26/2012   GAD (generalized anxiety disorder) 11/26/2012    Past Medical History:  Diagnosis Date   Anxiety    Takes xanax twice daily   ASCUS with positive high risk HPV cervical 09/15/2021   08/2021 +HPV 16/0ther ASCUS, needs colpo, immediate risk CIN3+ 9%   Asthma    Chronic headaches    "multiple times a day; they say it's related to thyroid"   Complication of anesthesia    Dysrhythmia    tachycardia   Grave's disease    H/O seasonal allergies    Headache(784.0)    most days   Pneumonia 11/02/2011   "years ago"   PONV (postoperative nausea and vomiting)    Systemic lupus erythematosus ([place])    Thyroid disease     Family History  Problem Relation Age of Onset   Hypertension Father    Hyperlipidemia Father    Hypothyroidism Father    Hypertension Mother    Hyperlipidemia Mother     Diabetes Mother    Healthy Sister    Anesthesia problems Neg Hx    Hypotension Neg Hx    Malignant hyperthermia Neg Hx    Pseudochol deficiency Neg Hx    Past Surgical History:  Procedure Laterality Date   THYROIDECTOMY  11/02/2011   Procedure: THYROIDECTOMY;  Surgeon: Izora Gala, MD;  Location: [facility];  Service: ENT;  Laterality: Bilateral;  TOTAL THYROIDECTOMY   TOTAL THYROIDECTOMY  11/02/11   WISDOM TOOTH EXTRACTION  2004   Social History   Social History Narrative   Not on file   Immunization History  Administered Date(s) Administered   DTaP 08/24/1984, 11/05/1984, 12/31/1984, 12/24/1985   Hepatitis B 04/26/1996, 05/31/1996, 08/30/1996   IPV 08/24/1984, 11/05/1984, 12/24/1985   Influenza,inj,Quad PF,6+ Mos 05/25/2020, 05/27/2021   Influenza-Unspecified 06/06/2011, 04/25/2012   MMR 12/24/1985   PPD Test 03/16/2015, 03/23/2015   Pneumococcal Polysaccharide-23 11/03/2011   Tdap 12/10/2010, 07/28/2021  Objective: Vital Signs: BP 111/66 (BP Location: Left Arm, Patient Position: Sitting, Cuff Size: Normal)   Pulse 90   Resp 14   Ht 5' 7"  (1.702 m)   Wt 169 lb 6.4 oz (76.8 kg)   BMI 26.53 kg/m    Physical Exam HENT:     Nose: Nose normal.     Mouth/Throat:     Mouth: Mucous membranes are moist.     Pharynx: Oropharynx is clear.  Cardiovascular:     Rate and Rhythm: Normal rate and regular rhythm.  Pulmonary:     Effort: Pulmonary effort is normal.     Breath sounds: Normal breath sounds.  Musculoskeletal:     Comments: Trace pedal edema  Skin:    General: Skin is warm and dry.     Findings: Rash present.     Comments: Hyperpigmented patches over extensor surfaces of both forearms and hands, irregular borders, sparing of flexor surfaces  Neurological:     Mental Status: She is alert.  Psychiatric:        Mood and Affect: Mood normal.      Musculoskeletal Exam:  Shoulders full ROM no tenderness or swelling Elbows full ROM no tenderness or  swelling Wrists full ROM no tenderness or swelling Fingers full ROM tenderness to pressure without palpable synovitis Knees full ROM no tenderness or swelling Ankles full ROM no tenderness or swelling MTPs full ROM no tenderness or swelling   Investigation: No additional findings.  Imaging: No results found.  Recent Labs: Lab Results  Component Value Date   WBC 9.7 10/14/2021   HGB 14.2 10/14/2021   PLT 265 10/14/2021   NA 138 10/14/2021   K 4.3 10/14/2021   CL 105 10/14/2021   CO2 23 10/14/2021   GLUCOSE 76 10/14/2021   BUN 11 10/14/2021   CREATININE 0.96 10/14/2021   BILITOT 0.7 10/14/2021   ALKPHOS 73 05/27/2021   AST 13 10/14/2021   ALT 9 10/14/2021   PROT 6.9 10/14/2021   ALBUMIN 4.4 05/27/2021   CALCIUM 8.0 (L) 10/14/2021   GFRAA 80 02/27/2020   QFTBGOLDPLUS NEGATIVE 12/08/2020    Speciality Comments: PLQ eye exam has not been done yet Benlysta started 01/28/21  Procedures:  No procedures performed Allergies: Patient has no known allergies.   Assessment / Plan:     Visit Diagnoses: Systemic lupus erythematosus, unspecified SLE type, unspecified organ involvement status ([place]) - Plan: Anti-DNA antibody, double-stranded, Sedimentation rate, CBC with Differential/Platelet, COMPLETE METABOLIC PANEL WITH GFR, predniSONE (DELTASONE) 5 MG tablet, methotrexate (RHEUMATREX) 2.5 MG tablet, folic acid (FOLVITE) 1 MG tablet  Symptoms worse pretty much all around at this time with joint pains fatigue skin rashes including hyperpigmentation changes especially in sun exposed areas.  Pedal edema is not an exacerbation.  Checking sedimentation rate and double-stranded DNA for disease activity monitoring.  I recommend we try switching treatment to methotrexate due to unimpressive response after about a year with Benlysta.  Start with 15 mg p.o. weekly milligram daily.  Also prescribed short course of prednisone in the next 2 weeks to impro the immediate symptoms.  High risk  medication use - hydroxychloroquine 300 mg daily and Benlysta 200 mg subcu weekly  Recommending discontinuation of current medications due to apparent lack of efficacy also increasing skin hyperpigmentation changes can rarely be increased with long-term use of hydroxychloroquine.  Checking CBC and CMP for medication monitoring recommending switch to methotrexate.  Effects risks of medication including GI intolerance cytopenias hepatotoxicity and pregnancy risk.  Orders: Orders Placed This Encounter  Procedures   Anti-DNA antibody, double-stranded   Sedimentation rate   CBC with Differential/Platelet   COMPLETE METABOLIC PANEL WITH GFR   Meds ordered this encounter  Medications   predniSONE (DELTASONE) 5 MG tablet    Sig: Take 4 tablets (20 mg total) by mouth daily with breakfast for 3 days, THEN 3 tablets (15 mg total) daily with breakfast for 3 days, THEN 2 tablets (10 mg total) daily with breakfast for 3 days, THEN 1 tablet (5 mg total) daily with breakfast for 3 days.    Dispense:  30 tablet    Refill:  0   methotrexate (RHEUMATREX) 2.5 MG tablet    Sig: Take 6 tablets (15 mg total) by mouth once a week. Caution:Chemotherapy. Protect from light.    Dispense:  30 tablet    Refill:  1   folic acid (FOLVITE) 1 MG tablet    Sig: Take 1 tablet (1 mg total) by mouth daily.    Dispense:  90 tablet    Refill:  0     Follow-Up Instructions: Return in about 2 months (around 05/22/2022) for SLE flare up HCQ/?MTX f/u 54mo.   CCollier Salina MD  Note - This record has been created using [employer]  Chart creation errors have been sought, but may not always  have been located. Such creation errors do not reflect on  the standard of medical care.

## 2022-03-11 ENCOUNTER — Other Ambulatory Visit (HOSPITAL_COMMUNITY): Payer: Self-pay

## 2022-03-16 ENCOUNTER — Other Ambulatory Visit (HOSPITAL_COMMUNITY): Payer: Self-pay

## 2022-03-21 ENCOUNTER — Ambulatory Visit: Payer: Medicaid Other | Admitting: Internal Medicine

## 2022-03-22 ENCOUNTER — Encounter: Payer: Self-pay | Admitting: Internal Medicine

## 2022-03-22 ENCOUNTER — Ambulatory Visit: Payer: Medicaid Other | Attending: Internal Medicine | Admitting: Internal Medicine

## 2022-03-22 VITALS — BP 111/66 | HR 90 | Resp 14 | Ht 67.0 in | Wt 169.4 lb

## 2022-03-22 DIAGNOSIS — M329 Systemic lupus erythematosus, unspecified: Secondary | ICD-10-CM | POA: Diagnosis not present

## 2022-03-22 DIAGNOSIS — Z79899 Other long term (current) drug therapy: Secondary | ICD-10-CM

## 2022-03-22 DIAGNOSIS — E559 Vitamin D deficiency, unspecified: Secondary | ICD-10-CM | POA: Diagnosis not present

## 2022-03-22 DIAGNOSIS — F331 Major depressive disorder, recurrent, moderate: Secondary | ICD-10-CM | POA: Diagnosis not present

## 2022-03-22 MED ORDER — FOLIC ACID 1 MG PO TABS: 1.0000 mg | ORAL_TABLET | Freq: Every day | ORAL | 0 refills | Status: DC

## 2022-03-22 MED ORDER — METHOTREXATE 2.5 MG PO TABS: 15.0000 mg | ORAL_TABLET | ORAL | 1 refills | Status: DC

## 2022-03-22 MED ORDER — PREDNISONE 5 MG PO TABS: ORAL_TABLET | ORAL | 0 refills | Status: AC

## 2022-03-22 NOTE — Patient Instructions (Signed)
Methotrexate Tablets What is this medication? METHOTREXATE (METH oh TREX ate) treats inflammatory conditions such as arthritis and psoriasis. It works by decreasing inflammation, which can reduce pain and prevent long-term injury to the joints and skin. It may also be used to treat some types of cancer. It works by slowing down the growth of cancer cells. This medicine may be used for other purposes; ask your health care provider or pharmacist if you have questions. COMMON BRAND NAME(S): Rheumatrex, Trexall What should I tell my care team before I take this medication? They need to know if you have any of these conditions: Fluid in the stomach area or lungs If you often drink alcohol Infection or immune system problems Kidney disease or on hemodialysis Liver disease Low blood counts, like low white cell, platelet, or red cell counts Lung disease Radiation therapy Stomach ulcers Ulcerative colitis An unusual or allergic reaction to methotrexate, other medications, foods, dyes, or preservatives Pregnant or trying to get pregnant Breast-feeding How should I use this medication? Take this medication by mouth with a glass of water. Follow the directions on the prescription label. Take your medication at regular intervals. Do not take it more often than directed. Do not stop taking except on your care team's advice. Make sure you know why you are taking this medication and how often you should take it. If this medication is used for a condition that is not cancer, like arthritis or psoriasis, it should be taken weekly, NOT daily. Taking this medication more often than directed can cause serious side effects, even death. Talk to your care team about safe handling and disposal of this medication. You may need to take special precautions. Talk to your care team about the use of this medication in children. While this medication may be prescribed for selected conditions, precautions do  apply. Overdosage: If you think you have taken too much of this medicine contact a poison control center or emergency room at once. NOTE: This medicine is only for you. Do not share this medicine with others. What if I miss a dose? If you miss a dose, talk with your care team. Do not take double or extra doses. What may interact with this medication? Do not take this medication with any of the following: Acitretin This medication may also interact with the following: Aspirin and aspirin-like medications including salicylates Azathioprine Certain antibiotics like penicillins, tetracycline, and chloramphenicol Certain medications that treat or prevent blood clots like warfarin, apixaban, dabigatran, and rivaroxaban Certain medications for stomach problems like esomeprazole, omeprazole, pantoprazole Cyclosporine Dapsone Diuretics Gold Hydroxychloroquine Live virus vaccines Medications for infection like acyclovir, adefovir, amphotericin B, bacitracin, cidofovir, foscarnet, ganciclovir, gentamicin, pentamidine, vancomycin Mercaptopurine NSAIDs, medications for pain and inflammation, like ibuprofen or naproxen Other cytotoxic agents Pamidronate Pemetrexed Penicillamine Phenylbutazone Phenytoin Probenecid Pyrimethamine Retinoids such as isotretinoin and tretinoin Steroid medications like prednisone or cortisone Sulfonamides like sulfasalazine and trimethoprim/sulfamethoxazole Theophylline Zoledronic acid This list may not describe all possible interactions. Give your health care provider a list of all the medicines, herbs, non-prescription drugs, or dietary supplements you use. Also tell them if you smoke, drink alcohol, or use illegal drugs. Some items may interact with your medicine. What should I watch for while using this medication? Avoid alcoholic drinks. This medication can make you more sensitive to the sun. Keep out of the sun. If you cannot avoid being in the sun, wear  protective clothing and use sunscreen. Do not use sun lamps or tanning beds/booths. You may need   blood work done while you are taking this medication. Call your care team for advice if you get a fever, chills or sore throat, or other symptoms of a cold or flu. Do not treat yourself. This medication decreases your body's ability to fight infections. Try to avoid being around people who are sick. This medication may increase your risk to bruise or bleed. Call your care team if you notice any unusual bleeding. Be careful brushing or flossing your teeth or using a toothpick because you may get an infection or bleed more easily. If you have any dental work done, tell your dentist you are receiving this medication. Check with your care team if you get an attack of severe diarrhea, nausea and vomiting, or if you sweat a lot. The loss of too much body fluid can make it dangerous for you to take this medication. Talk to your care team about your risk of cancer. You may be more at risk for certain types of cancers if you take this medication. Do not become pregnant while taking this medication or for 6 months after stopping it. Women should inform their care team if they wish to become pregnant or think they might be pregnant. Men should not father a child while taking this medication and for 3 months after stopping it. There is potential for serious harm to an unborn child. Talk to your care team for more information. Do not breast-feed an infant while taking this medication or for 1 week after stopping it. This medication may make it more difficult to get pregnant or father a child. Talk to your care team if you are concerned about your fertility. What side effects may I notice from receiving this medication? Side effects that you should report to your care team as soon as possible: Allergic reactions--skin rash, itching, hives, swelling of the face, lips, tongue, or throat Blood clot--pain, swelling, or warmth  in the leg, shortness of breath, chest pain Dry cough, shortness of breath or trouble breathing Infection--fever, chills, cough, sore throat, wounds that don't heal, pain or trouble when passing urine, general feeling of discomfort or being unwell Kidney injury--decrease in the amount of urine, swelling of the ankles, hands, or feet Liver injury--right upper belly pain, loss of appetite, nausea, light-colored stool, dark yellow or brown urine, yellowing of the skin or eyes, unusual weakness or fatigue Low red blood cell count--unusual weakness or fatigue, dizziness, headache, trouble breathing Redness, blistering, peeling, or loosening of the skin, including inside the mouth Seizures Unusual bruising or bleeding Side effects that usually do not require medical attention (report to your care team if they continue or are bothersome): Diarrhea Dizziness Hair loss Nausea Pain, redness, or swelling with sores inside the mouth or throat Vomiting This list may not describe all possible side effects. Call your doctor for medical advice about side effects. You may report side effects to FDA at 1-800-FDA-1088. Where should I keep my medication? Keep out of the reach of children and pets. Store at room temperature between 20 and 25 degrees C (68 and 77 degrees F). Protect from light. Get rid of any unused medication after the expiration date. Talk to your care team about how to dispose of unused medication. Special directions may apply. NOTE: This sheet is a summary. It may not cover all possible information. If you have questions about this medicine, talk to your doctor, pharmacist, or health care provider.  2023 Elsevier/Gold Standard (2007-08-04 00:00:00)  

## 2022-03-23 LAB — COMPLETE METABOLIC PANEL WITH GFR
AG Ratio: 1.7 (calc) (ref 1.0–2.5)
ALT: 4 U/L — ABNORMAL LOW (ref 6–29)
AST: 11 U/L (ref 10–30)
Albumin: 4.6 g/dL (ref 3.6–5.1)
Alkaline phosphatase (APISO): 72 U/L (ref 31–125)
BUN: 7 mg/dL (ref 7–25)
CO2: 28 mmol/L (ref 20–32)
Calcium: 8.3 mg/dL — ABNORMAL LOW (ref 8.6–10.2)
Chloride: 101 mmol/L (ref 98–110)
Creat: 0.79 mg/dL (ref 0.50–0.97)
Globulin: 2.7 g/dL (calc) (ref 1.9–3.7)
Glucose, Bld: 49 mg/dL — ABNORMAL LOW (ref 65–99)
Potassium: 3.9 mmol/L (ref 3.5–5.3)
Sodium: 140 mmol/L (ref 135–146)
Total Bilirubin: 0.6 mg/dL (ref 0.2–1.2)
Total Protein: 7.3 g/dL (ref 6.1–8.1)
eGFR: 99 mL/min/{1.73_m2} (ref >=60)

## 2022-03-23 LAB — CBC WITH DIFFERENTIAL/PLATELET
Absolute Monocytes: 519 cells/uL (ref 200–950)
Basophils Absolute: 123 cells/uL (ref 0–200)
Basophils Relative: 1.4 %
Eosinophils Absolute: 273 cells/uL (ref 15–500)
Eosinophils Relative: 3.1 %
HCT: 44 % (ref 35.0–45.0)
Hemoglobin: 15.2 g/dL (ref 11.7–15.5)
Lymphs Abs: 3608 cells/uL (ref 850–3900)
MCH: 30.6 pg (ref 27.0–33.0)
MCHC: 34.5 g/dL (ref 32.0–36.0)
MCV: 88.5 fL (ref 80.0–100.0)
MPV: 10.5 fL (ref 7.5–12.5)
Monocytes Relative: 5.9 %
Neutro Abs: 4277 cells/uL (ref 1500–7800)
Neutrophils Relative %: 48.6 %
Platelets: 373 10*3/uL (ref 140–400)
RBC: 4.97 10*6/uL (ref 3.80–5.10)
RDW: 13.3 % (ref 11.0–15.0)
Total Lymphocyte: 41 %
WBC: 8.8 10*3/uL (ref 3.8–10.8)

## 2022-03-23 LAB — ANTI-DNA ANTIBODY, DOUBLE-STRANDED: ds DNA Ab: 53 IU/mL — ABNORMAL HIGH

## 2022-03-23 LAB — SEDIMENTATION RATE: Sed Rate: 22 mm/h — ABNORMAL HIGH (ref 0–20)

## 2022-03-23 NOTE — Progress Notes (Signed)
Sedimentation rate is increased this is probably from a flare up with inflammation. Her blood sugar was pretty low, was she eating yesterday? The hydroxychloroquine can sometimes lower blood sugar as well and might have been contributing.

## 2022-03-24 ENCOUNTER — Ambulatory Visit (INDEPENDENT_AMBULATORY_CARE_PROVIDER_SITE_OTHER): Payer: Medicaid Other | Admitting: Family Medicine

## 2022-03-24 ENCOUNTER — Encounter: Payer: Self-pay | Admitting: Family Medicine

## 2022-03-24 VITALS — BP 132/82 | HR 99 | Temp 97.4°F | Ht 67.0 in | Wt 165.0 lb

## 2022-03-24 DIAGNOSIS — F331 Major depressive disorder, recurrent, moderate: Secondary | ICD-10-CM

## 2022-03-24 DIAGNOSIS — F411 Generalized anxiety disorder: Secondary | ICD-10-CM

## 2022-03-24 DIAGNOSIS — E890 Postprocedural hypothyroidism: Secondary | ICD-10-CM | POA: Diagnosis not present

## 2022-03-24 DIAGNOSIS — Z23 Encounter for immunization: Secondary | ICD-10-CM

## 2022-03-24 DIAGNOSIS — E782 Mixed hyperlipidemia: Secondary | ICD-10-CM | POA: Diagnosis not present

## 2022-03-24 MED ORDER — SERTRALINE HCL 50 MG PO TABS: 50.0000 mg | ORAL_TABLET | Freq: Every day | ORAL | 5 refills | Status: DC

## 2022-03-24 NOTE — Progress Notes (Signed)
BP 132/82   Pulse 99   Temp (!) 97.4 F (36.3 C)   Ht 5\' 7"  (1.702 m)   Wt 165 lb (74.8 kg)   SpO2 100%   BMI 25.84 kg/m    Subjective:   Patient ID: , female    DOB: 1986-01-30, 36 y.o.   MRN: [id number]  HPI: Valerie Ayala is a 36 y.o. female presenting on 03/24/2022 for No chief complaint on file.   HPI Anxiety and depression recheck Patient is coming in today for anxiety and depression recheck.  She says that she did not do well on the Trintellix and has been doing some research and would like to try Zoloft.  She says she never tried it in the past and her son is doing so well on it and she wants to try it.  She says she is still liking the Rexulti and it is kept her from feeling like she is going insane but she does feel like she needs something else to help with anxiety.  She has thoughts of being better off dead but no thoughts of suicide and actually killing herself.    03/24/2022    9:07 AM 11/19/2021   10:15 AM 09/08/2021   11:00 AM 07/28/2021    1:25 PM 05/27/2021    1:48 PM  Depression screen PHQ 2/9  Decreased Interest 3 2 1 2 1   Down, Depressed, Hopeless 3 1 1 2 1   PHQ - 2 Score 6 3 2 4 2   Altered sleeping 3 3 3 3 3   Tired, decreased energy 3 3 3 3 3   Change in appetite 3 1 1 2 2   Feeling bad or failure about yourself  3 1 1 2 1   Trouble concentrating 2 1 1 1 2   Moving slowly or fidgety/restless 2 1 0 0 0  Suicidal thoughts 2 0 0 0 0  PHQ-9 Score 24 13 11 15 13   Difficult doing work/chores Extremely dIfficult Very difficult       Hypothyroidism recheck Patient is coming in for thyroid recheck today as well. They deny any issues with hair changes or heat or cold problems or diarrhea or constipation. They deny any chest pain or palpitations. They are currently on levothyroxine 150 micrograms  Relevant past medical, surgical, family and social history reviewed and updated as indicated. Interim medical history since our last visit reviewed. Allergies and  medications reviewed and updated.  Review of Systems  Constitutional:  Negative for chills and fever.  Eyes:  Negative for visual disturbance.  Respiratory:  Negative for chest tightness and shortness of breath.   Cardiovascular:  Negative for chest pain and leg swelling.  Skin:  Negative for rash.  Neurological:  Negative for dizziness, light-headedness and headaches.  Psychiatric/Behavioral:  Positive for dysphoric mood and sleep disturbance. Negative for agitation, behavioral problems, self-injury and suicidal ideas. The patient is nervous/anxious.   All other systems reviewed and are negative.   Per HPI unless specifically indicated above   Allergies as of 03/24/2022   No Known Allergies      Medication List        Accurate as of March 24, 2022  9:24 AM. If you have any questions, ask your nurse or doctor.          STOP taking these medications    vortioxetine HBr 10 MG Tabs tablet Commonly known as: TRINTELLIX Stopped by: , MD       TAKE these medications  albuterol 108 (90 Base) MCG/ACT inhaler Commonly known as: VENTOLIN HFA Inhale 2 puffs into the lungs every 6 (six) hours as needed. For shortness of breath   atorvastatin 40 MG tablet Commonly known as: LIPITOR Take 1 tablet (40 mg total) by mouth at bedtime.   brexpiprazole 2 MG Tabs tablet Commonly known as: Rexulti Take 1 tablet (2 mg total) by mouth daily.   cholecalciferol 1000 units tablet Commonly known as: VITAMIN D Take 1,000 Units by mouth daily.   folic acid 1 MG tablet Commonly known as: FOLVITE Take 1 tablet (1 mg total) by mouth daily.   levothyroxine 150 MCG tablet Commonly known as: SYNTHROID Take 1 tablet (150 mcg total) by mouth daily.   medroxyPROGESTERone 150 MG/ML injection Commonly known as: DEPO-PROVERA Inject 1 mL (150 mg total) into the muscle every 3 (three) months.   methotrexate 2.5 MG tablet Commonly known as: RHEUMATREX Take 6 tablets  (15 mg total) by mouth once a week. Caution:Chemotherapy. Protect from light.   predniSONE 5 MG tablet Commonly known as: DELTASONE Take 4 tablets (20 mg total) by mouth daily with breakfast for 3 days, THEN 3 tablets (15 mg total) daily with breakfast for 3 days, THEN 2 tablets (10 mg total) daily with breakfast for 3 days, THEN 1 tablet (5 mg total) daily with breakfast for 3 days. Start taking on: March 22, 2022   Pseudoephedrine-Guaifenesin [id number] MG Tb12 Take 1 tablet by mouth 2 (two) times daily. For congestion   sertraline 50 MG tablet Commonly known as: Zoloft Take 1 tablet (50 mg total) by mouth daily. Started by: Nils Pyle, MD   varenicline 1 MG tablet Commonly known as: CHANTIX Take 1 tablet (1 mg total) by mouth 2 (two) times daily.         Objective:   BP 132/82   Pulse 99   Temp (!) 97.4 F (36.3 C)   Ht 5\' 7"  (1.702 m)   Wt 165 lb (74.8 kg)   SpO2 100%   BMI 25.84 kg/m   Wt Readings from Last 3 Encounters:  03/24/22 165 lb (74.8 kg)  03/22/22 169 lb 6.4 oz (76.8 kg)  01/24/22 167 lb (75.8 kg)    Physical Exam Vitals and nursing note reviewed.  Constitutional:      General: She is not in acute distress.    Appearance: She is well-developed. She is not diaphoretic.  Eyes:     Conjunctiva/sclera: Conjunctivae normal.  Cardiovascular:     Rate and Rhythm: Normal rate and regular rhythm.     Heart sounds: Normal heart sounds. No murmur heard. Pulmonary:     Effort: Pulmonary effort is normal. No respiratory distress.     Breath sounds: Normal breath sounds. No wheezing.  Musculoskeletal:        General: No swelling or tenderness. Normal range of motion.  Skin:    General: Skin is warm and dry.     Findings: No rash.  Neurological:     Mental Status: She is alert and oriented to person, place, and time.     Coordination: Coordination normal.  Psychiatric:        Behavior: Behavior normal.       Assessment & Plan:   Problem  List Items Addressed This Visit       Endocrine   Hypothyroidism - Primary   Relevant Orders   TSH     Other   GAD (generalized anxiety disorder)   Relevant Medications   sertraline (  ZOLOFT) 50 MG tablet   Other Relevant Orders   TSH   Major depression, recurrent (HCC)   Relevant Medications   sertraline (ZOLOFT) 50 MG tablet   Hyperlipidemia   Relevant Orders   Lipid panel    We will start Zoloft and get the Zoloft with the Rexulti and see how she does, come back in 1 to 2 months. Follow up plan: Return if symptoms worsen or fail to improve, for 1 to 70-month anxiety and depression recheck.  Counseling provided for all of the vaccine components Orders Placed This Encounter  Procedures   TSH   Lipid panel    Caryl Pina, MD [facility] 03/24/2022, 9:24 AM

## 2022-03-25 ENCOUNTER — Other Ambulatory Visit: Payer: Self-pay

## 2022-03-25 LAB — LIPID PANEL
Chol/HDL Ratio: 5.8 ratio — ABNORMAL HIGH (ref 0.0–4.4)
Cholesterol, Total: 179 mg/dL (ref 100–199)
HDL: 31 mg/dL — ABNORMAL LOW (ref >39)
LDL Chol Calc (NIH): 129 mg/dL — ABNORMAL HIGH (ref 0–99)
Triglycerides: 104 mg/dL (ref 0–149)
VLDL Cholesterol Cal: 19 mg/dL (ref 5–40)

## 2022-03-25 LAB — TSH: TSH: 3.24 u[IU]/mL (ref 0.450–4.500)

## 2022-03-25 MED ORDER — ATORVASTATIN CALCIUM 80 MG PO TABS: 80.0000 mg | ORAL_TABLET | Freq: Every day | ORAL | 1 refills | Status: DC

## 2022-04-01 ENCOUNTER — Ambulatory Visit: Payer: Medicaid Other | Admitting: Family Medicine

## 2022-04-05 ENCOUNTER — Other Ambulatory Visit (HOSPITAL_COMMUNITY): Payer: Self-pay

## 2022-04-07 ENCOUNTER — Other Ambulatory Visit (HOSPITAL_COMMUNITY): Payer: Self-pay

## 2022-04-11 ENCOUNTER — Other Ambulatory Visit (HOSPITAL_COMMUNITY): Payer: Self-pay

## 2022-04-15 ENCOUNTER — Other Ambulatory Visit (HOSPITAL_COMMUNITY): Payer: Self-pay

## 2022-05-02 ENCOUNTER — Other Ambulatory Visit: Payer: Self-pay | Admitting: Internal Medicine

## 2022-05-02 ENCOUNTER — Other Ambulatory Visit: Payer: Self-pay | Admitting: *Deleted

## 2022-05-02 DIAGNOSIS — M329 Systemic lupus erythematosus, unspecified: Secondary | ICD-10-CM

## 2022-05-02 DIAGNOSIS — E890 Postprocedural hypothyroidism: Secondary | ICD-10-CM

## 2022-05-02 DIAGNOSIS — F331 Major depressive disorder, recurrent, moderate: Secondary | ICD-10-CM

## 2022-05-02 DIAGNOSIS — F411 Generalized anxiety disorder: Secondary | ICD-10-CM

## 2022-05-02 MED ORDER — BREXPIPRAZOLE 2 MG PO TABS: 2.0000 mg | ORAL_TABLET | Freq: Every day | ORAL | 0 refills | Status: DC

## 2022-05-02 MED ORDER — LEVOTHYROXINE SODIUM 150 MCG PO TABS: 150.0000 ug | ORAL_TABLET | Freq: Every day | ORAL | 3 refills | Status: DC

## 2022-05-02 NOTE — Telephone Encounter (Signed)
Next Visit: 06/02/2022  Last Visit: 03/22/2022  Last Fill: 03/22/2022  DX: Systemic lupus erythematosus, unspecified SLE type, unspecified organ involvement status   Current Dose per office note 03/22/2022: 15 mg p.o. weekly milligram daily   Labs: 03/22/2022 Advised patient that her sedimentation rate is increased this is probably from a flare up with inflammation. Her blood sugar was pretty low, so inquired if she was eating yesterday. Patient stated she had not yet eaten when the labs were drawn.   Okay to refill methotrexate?

## 2022-05-12 ENCOUNTER — Ambulatory Visit: Payer: Medicaid Other

## 2022-05-16 ENCOUNTER — Ambulatory Visit (INDEPENDENT_AMBULATORY_CARE_PROVIDER_SITE_OTHER): Payer: Medicaid Other

## 2022-05-16 DIAGNOSIS — Z308 Encounter for other contraceptive management: Secondary | ICD-10-CM

## 2022-05-16 DIAGNOSIS — Z3042 Encounter for surveillance of injectable contraceptive: Secondary | ICD-10-CM | POA: Diagnosis not present

## 2022-05-16 NOTE — Progress Notes (Signed)
PATIENT TOLERATED DEPO VERY WELL

## 2022-05-24 ENCOUNTER — Telehealth: Payer: Self-pay

## 2022-05-24 NOTE — Telephone Encounter (Signed)
Received PA request for Rexulti. This has been approved in the past but approval ends on 12/6. Initiated PA in covermymeds but could not be completed because of insurance being terminated.  Called pt. No answer and vmail is full.

## 2022-05-25 ENCOUNTER — Encounter: Payer: Self-pay | Admitting: Family Medicine

## 2022-05-25 ENCOUNTER — Ambulatory Visit (INDEPENDENT_AMBULATORY_CARE_PROVIDER_SITE_OTHER): Payer: Medicaid Other | Admitting: Family Medicine

## 2022-05-25 VITALS — BP 113/63 | HR 140 | Ht 67.0 in | Wt 165.0 lb

## 2022-05-25 DIAGNOSIS — F411 Generalized anxiety disorder: Secondary | ICD-10-CM | POA: Diagnosis not present

## 2022-05-25 DIAGNOSIS — F331 Major depressive disorder, recurrent, moderate: Secondary | ICD-10-CM

## 2022-05-25 MED ORDER — SERTRALINE HCL 100 MG PO TABS: 100.0000 mg | ORAL_TABLET | Freq: Every day | ORAL | 2 refills | Status: DC

## 2022-05-25 NOTE — Progress Notes (Signed)
BP 113/63   Pulse (!) 140   Ht 5\' 7"  (1.702 m)   Wt 165 lb (74.8 kg)   SpO2 97%   BMI 25.84 kg/m    Subjective:   Patient ID: , female    DOB: March 16, 1986, 36 y.o.   MRN: [id number]  HPI: Valerie Ayala is a 36 y.o. female presenting on 05/25/2022 for Medical Management of Chronic Issues, Anxiety, and Depression   HPI Anxiety and depression recheck Patient feels like she is doing better on the Zoloft but could use a little bit higher dose but denies any side effects and feels like it is on the best of anything for her.  She is still going with the Rexulti as well.  She denies any suicidal ideations.  Patient's heart rate is up initially from walking and from the cold but on exam it was lower      05/25/2022    1:18 PM 03/24/2022    9:07 AM 11/19/2021   10:15 AM 09/08/2021   11:00 AM 07/28/2021    1:25 PM  Depression screen PHQ 2/9  Decreased Interest 1 3 2 1 2   Down, Depressed, Hopeless 1 3 1 1 2   PHQ - 2 Score 2 6 3 2 4   Altered sleeping 3 3 3 3 3   Tired, decreased energy 3 3 3 3 3   Change in appetite 2 3 1 1 2   Feeling bad or failure about yourself  2 3 1 1 2   Trouble concentrating 2 2 1 1 1   Moving slowly or fidgety/restless 0 2 1 0 0  Suicidal thoughts 0 2 0 0 0  PHQ-9 Score 14 24 13 11 15   Difficult doing work/chores Somewhat difficult Extremely dIfficult Very difficult       Relevant past medical, surgical, family and social history reviewed and updated as indicated. Interim medical history since our last visit reviewed. Allergies and medications reviewed and updated.  Review of Systems  Constitutional:  Negative for chills and fever.  Eyes:  Negative for visual disturbance.  Respiratory:  Negative for chest tightness and shortness of breath.   Cardiovascular:  Negative for chest pain and leg swelling.  Skin:  Negative for rash.  Neurological:  Negative for light-headedness and headaches.  Psychiatric/Behavioral:  Positive for dysphoric mood. Negative  for agitation, behavioral problems, self-injury, sleep disturbance and suicidal ideas. The patient is nervous/anxious.   All other systems reviewed and are negative.   Per HPI unless specifically indicated above   Allergies as of 05/25/2022   No Known Allergies      Medication List        Accurate as of May 25, 2022  1:27 PM. If you have any questions, ask your nurse or doctor.          albuterol 108 (90 Base) MCG/ACT inhaler Commonly known as: VENTOLIN HFA Inhale 2 puffs into the lungs every 6 (six) hours as needed. For shortness of breath   atorvastatin 80 MG tablet Commonly known as: LIPITOR Take 1 tablet (80 mg total) by mouth daily.   brexpiprazole 2 MG Tabs tablet Commonly known as: Rexulti Take 1 tablet (2 mg total) by mouth daily.   cholecalciferol 1000 units tablet Commonly known as: VITAMIN D Take 1,000 Units by mouth daily.   folic acid 1 MG tablet Commonly known as: FOLVITE Take 1 tablet (1 mg total) by mouth daily.   levothyroxine 150 MCG tablet Commonly known as: SYNTHROID Take 1 tablet (150 mcg  total) by mouth daily.   medroxyPROGESTERone 150 MG/ML injection Commonly known as: DEPO-PROVERA Inject 1 mL (150 mg total) into the muscle every 3 (three) months.   methotrexate 2.5 MG tablet Commonly known as: RHEUMATREX Take 6 tablets (15 mg total) by mouth once a week. Caution:Chemotherapy. Protect from light.   Pseudoephedrine-Guaifenesin [phone] MG Tb12 Take 1 tablet by mouth 2 (two) times daily. For congestion   sertraline 100 MG tablet Commonly known as: Zoloft Take 1 tablet (100 mg total) by mouth daily. What changed:  medication strength how much to take Changed by: Nils Pyle, MD   varenicline 1 MG tablet Commonly known as: CHANTIX Take 1 tablet (1 mg total) by mouth 2 (two) times daily.         Objective:   BP 113/63   Pulse (!) 140   Ht 5\' 7"  (1.702 m)   Wt 165 lb (74.8 kg)   SpO2 97%   BMI 25.84 kg/m    Wt Readings from Last 3 Encounters:  05/25/22 165 lb (74.8 kg)  03/24/22 165 lb (74.8 kg)  03/22/22 169 lb 6.4 oz (76.8 kg)    Physical Exam Vitals and nursing note reviewed.  Constitutional:      General: She is not in acute distress.    Appearance: She is well-developed. She is not diaphoretic.  Eyes:     Conjunctiva/sclera: Conjunctivae normal.  Cardiovascular:     Rate and Rhythm: Regular rhythm. Tachycardia present.     Heart sounds: Normal heart sounds. No murmur heard. Pulmonary:     Effort: Pulmonary effort is normal. No respiratory distress.     Breath sounds: Normal breath sounds. No wheezing.  Musculoskeletal:        General: No tenderness. Normal range of motion.  Skin:    General: Skin is warm and dry.     Findings: No rash.  Neurological:     Mental Status: She is alert and oriented to person, place, and time.     Coordination: Coordination normal.  Psychiatric:        Behavior: Behavior normal.       Assessment & Plan:   Problem List Items Addressed This Visit       Other   GAD (generalized anxiety disorder) - Primary   Relevant Medications   sertraline (ZOLOFT) 100 MG tablet   Major depression, recurrent (HCC)   Relevant Medications   sertraline (ZOLOFT) 100 MG tablet  Increase Zoloft and see how it does for her.  Follow up plan: Return if symptoms worsen or fail to improve, for 1 to 61-month anxiety depression recheck.  Counseling provided for all of the vaccine components No orders of the defined types were placed in this encounter.   3-month, MD [facility] 05/25/2022, 1:27 PM

## 2022-06-01 NOTE — Progress Notes (Signed)
Office Visit Note  Patient: Valerie Ayala             Date of Birth: 12-May-1986           MRN: [id number]             PCP: Dettinger, Fransisca Kaufmann, MD Referring: Dettinger, Fransisca Kaufmann, MD Visit Date: 06/02/2022   Subjective:  Pain of the Left Shoulder, Pain of the Right Elbow, Pain of the Left Elbow, Pain of the Right Shoulder, and Follow-up (All over joint pains. )   History of Present Illness: Valerie Ayala is a 36 y.o. female here for follow up for SLE after last visit discontinued benlysta due to loss of efficacy and starting methotrexate 15 mg PO weekly and folic acid 1 mg daily and continuing HCQ 300 mg daily. Also 2 week prednisone taper for general exacerbation of symptoms at that time likely triggered by preceding URI. Since then she continues to feel considerable joint pain and stiffness. Most affected in her bilateral shoulders and knees but also with hand pain limiting some activities such as coloring. Symptoms are worst overnight and for a few hours each morning. She had at least one episode of bilateral leg swelling lasting several days.   Previous HPI 03/22/22 Valerie Ayala is a 36 y.o. female here for follow up for SLE on HCQ 300 mg daily and benlysta 200 mg [place] weekly. She is currently feeling worse pretty much all over. Especially joint pains and fatigue and increased skin rashes. She had an upper respiratory illness recently she recovered since about a week ago but has other symptoms are all worse since then. Leg swelling is doing okay and no new mouth and nose ulcers.   Previous HPI 10/14/2021 Valerie Ayala is a 36 y.o. female here for follow up for SLE on HCQ 300 mg daily and Benlysta 200 mg [place] weekly. At last visit significant worsening in lower extremity swelling but normal lab markers and negative for proteinuria.  Since the last visit he continues to do very poorly.  She has ongoing leg swelling itching sometimes there is redness discoloration.  Newer problem is feeling some proximal  leg weakness occasional sense of her legs giving out or locking up when walking.  She is extremely fatigued almost every day.  Sometimes she cannot even get out of bed for hours and her mood is very poor due to decreased ability to do anything.  She is also been noticing some palpitations and lightheadedness getting short of breath with very brief or mild exertion.     Previous HPI 06/15/21 Valerie Ayala is a 36 y.o. female here for follow up for SLE on HCQ 300 mg daily and Benlysta 200 mg [place] weekly.  She was doing very well after our last visit until about a month ago now feels everything is horrible.  She has significant bilateral leg swelling with pitting edema developing increased tenderness and painful burning sensation with discolorations and skin changes in both legs.  She feels very fatigued.  Some skin dryness and peeling on the hands but no significant erythematous rash outside of the lower legs.  She recently also had flu with generalized body pains prescribed very large anterior cervical adenopathy.  This improved though she still has a residual ulcer in the mouth. Labs earlier this month showing very low TSH level with recent slight downward adjustment of her thyroid replacement.  She quit smoking with use of Chantix.   Previous  HPI 12/08/20 Valerie Ayala is a 36 y.o. female here for follow up for systemic lupus on hydroxychloroquine 300 mg p.o. daily. No signfiicant improvement since starting hydroxychloroquine 8 weeks ago.  She has not yet seen ophthalmology for retinal exam but plans to do so. Leg swelling continues to come and go, residual erythema and pain and burning sensations.  She continues to feel extremely fatigued as the most noticeable symptom.  She is also had a few mouth sores headaches and swollen glands but also describes the ongoing leg swelling sometimes with pain and discoloration.  Currently her left distal leg is most affected today.   Lupus  manifestations Fatigue Arthralgias Photosensitive rash Oral ulcers Raynaud's   Lupus serology ANA 1:80 speckled dsDNA 49 Complement C3, C4 wnl Vit D 19   Review of Systems  Constitutional:  Positive for fatigue.  HENT:  Positive for mouth dryness. Negative for mouth sores.   Eyes:  Positive for dryness.  Respiratory:  Positive for shortness of breath.   Cardiovascular:  Negative for chest pain and palpitations.  Gastrointestinal:  Positive for constipation. Negative for blood in stool and diarrhea.  Endocrine: Positive for increased urination.  Genitourinary:  Positive for nocturia. Negative for involuntary urination.  Musculoskeletal:  Positive for joint pain, gait problem, joint pain, joint swelling, myalgias, muscle weakness, morning stiffness, muscle tenderness and myalgias.  Skin:  Positive for color change and hair loss. Negative for rash and sensitivity to sunlight.  Allergic/Immunologic: Negative for susceptible to infections.  Neurological:  Positive for numbness. Negative for dizziness and headaches.  Hematological:  Positive for swollen glands.  Psychiatric/Behavioral:  Negative for depressed mood and sleep disturbance. The patient is not nervous/anxious.     PMFS History:  Patient Active Problem List   Diagnosis Date Noted   ASCUS with positive high risk HPV cervical 09/15/2021   History of abnormal cervical Pap smear 09/08/2021   Encounter for gynecological examination with Papanicolaou smear of cervix 09/08/2021   Routine general medical examination at a health care facility 09/08/2021   Swelling of lower extremity 12/08/2020   High risk medication use 12/08/2020   Hyperlipidemia 12/03/2020   Hypocalcemia 09/07/2020   Vitamin D deficiency 09/07/2020   Systemic lupus ([place]) 08/21/2020   Dry mouth 08/21/2020   Tobacco use disorder 08/21/2020   Arthralgia 08/21/2020   Raynaud's phenomenon 08/21/2020   Major depression, recurrent ([place]) 04/26/2018   Adult ADHD  12/16/2014   Asthma 05/07/2013   Hypothyroidism 11/26/2012   GAD (generalized anxiety disorder) 11/26/2012    Past Medical History:  Diagnosis Date   Anxiety    Takes xanax twice daily   ASCUS with positive high risk HPV cervical 09/15/2021   08/2021 +HPV 16/0ther ASCUS, needs colpo, immediate risk CIN3+ 9%   Asthma    Chronic headaches    "multiple times a day; they say it's related to thyroid"   Complication of anesthesia    Dysrhythmia    tachycardia   Grave's disease    H/O seasonal allergies    Headache(784.0)    most days   Pneumonia 11/02/2011   "years ago"   PONV (postoperative nausea and vomiting)    Systemic lupus erythematosus ([place])    Thyroid disease     Family History  Problem Relation Age of Onset   Hypertension Father    Hyperlipidemia Father    Hypothyroidism Father    Hypertension Mother    Hyperlipidemia Mother    Diabetes Mother    Healthy Sister  Anesthesia problems Neg Hx    Hypotension Neg Hx    Malignant hyperthermia Neg Hx    Pseudochol deficiency Neg Hx    Past Surgical History:  Procedure Laterality Date   THYROIDECTOMY  11/02/2011   Procedure: THYROIDECTOMY;  Surgeon: Izora Gala, MD;  Location: [facility];  Service: ENT;  Laterality: Bilateral;  TOTAL THYROIDECTOMY   TOTAL THYROIDECTOMY  11/02/11   WISDOM TOOTH EXTRACTION  2004   Social History   Social History Narrative   Not on file   Immunization History  Administered Date(s) Administered   DTaP 08/24/1984, 11/05/1984, 12/31/1984, 12/24/1985   Hepatitis B 04/26/1996, 05/31/1996, 08/30/1996   IPV 08/24/1984, 11/05/1984, 12/24/1985   Influenza,inj,Quad PF,6+ Mos 05/25/2020, 05/27/2021, 03/24/2022   Influenza-Unspecified 06/06/2011, 04/25/2012   MMR 12/24/1985   PPD Test 03/16/2015, 03/23/2015   Pneumococcal Polysaccharide-23 11/03/2011   Tdap 12/10/2010, 07/28/2021     Objective: Vital Signs: BP 125/84 (BP Location: Left Arm, Patient Position: Sitting, Cuff Size: Normal)   Resp  16   Ht _0  (1.702 m)   Wt 167 lb 12.8 oz (76.1 kg)   BMI 26.28 kg/m    Physical Exam Eyes:     Conjunctiva/sclera: Conjunctivae normal.  Cardiovascular:     Rate and Rhythm: Normal rate and regular rhythm.  Pulmonary:     Effort: Pulmonary effort is normal.     Breath sounds: Normal breath sounds.  Skin:    General: Skin is warm and dry.     Findings: No rash.  Neurological:     Mental Status: She is alert.  Psychiatric:        Mood and Affect: Mood normal.      Musculoskeletal Exam:  Neck full ROM no tenderness Shoulders full ROM left shoulder soreness with overhead abduction and tenderness to pressure mostly posteriorly Elbows full ROM no tenderness or swelling Wrists full ROM no tenderness or swelling Fingers full ROM no tenderness or swelling Knees full ROM mild tenderness and crepitus, no palpable swelling Ankles full ROM no tenderness or swelling   Investigation: No additional findings.  Imaging: No results found.  Recent Labs: Lab Results  Component Value Date   WBC 8.8 03/22/2022   HGB 15.2 03/22/2022   PLT 373 03/22/2022   NA 140 03/22/2022   K 3.9 03/22/2022   CL 101 03/22/2022   CO2 28 03/22/2022   GLUCOSE 49 (L) 03/22/2022   BUN 7 03/22/2022   CREATININE 0.79 03/22/2022   BILITOT 0.6 03/22/2022   ALKPHOS 73 05/27/2021   AST 11 03/22/2022   ALT 4 (L) 03/22/2022   PROT 7.3 03/22/2022   ALBUMIN 4.4 05/27/2021   CALCIUM 8.3 (L) 03/22/2022   GFRAA 80 02/27/2020   QFTBGOLDPLUS NEGATIVE 12/08/2020    Speciality Comments: PLQ eye exam has not been done yet Benlysta started 01/28/21  Procedures:  No procedures performed Allergies: Patient has no known allergies.   Assessment / Plan:     Visit Diagnoses: Systemic lupus erythematosus, unspecified SLE type, unspecified organ involvement status ([place]) - Plan: Sedimentation rate, Anti-DNA antibody, double-stranded, Protein / creatinine ratio, urine  Increased joint pains reported but no active  synovitis evident on exam today.  Will recheck sedimentation rate double-stranded DNA which were both elevated at most recent visit with apparent symptom exacerbations.  Also rechecking urine protein creatinine ratio screening.  No severe flareup or complications after withdrawal of Benlysta consistent with loss of efficacy.  Since she is tolerating the medication but without great symptom relief we can  try titrating methotrexate to 20 mg p.o. weekly if labs are okay and continue folic acid 1 mg daily.  Also continue hydroxychloroquine 300 mg daily.  High risk medication use - Plan: CBC with Differential/Platelet, COMPLETE METABOLIC PANEL WITH GFR  Checking CBC and CMP for methotrexate toxicity monitoring.  Otherwise tolerating the medication okay.  Raynaud's phenomenon without gangrene  Increasing symptoms with colder weather but not developing any ischemic injury and exam stable.  Continue with current someday smoker.  Swelling of lower extremity  Pedal edema appears well today although she has reported some episodes of increased fluids since her last visit.  Orders: Orders Placed This Encounter  Procedures   Sedimentation rate   CBC with Differential/Platelet   COMPLETE METABOLIC PANEL WITH GFR   Anti-DNA antibody, double-stranded   Protein / creatinine ratio, urine   No orders of the defined types were placed in this encounter.    Follow-Up Instructions: Return in about 2 months (around 08/03/2022) for SLE HCQ/MTX increase f/u 35mo.   CCollier Salina MD  Note - This record has been created using [employer]  Chart creation errors have been sought, but may not always  have been located. Such creation errors do not reflect on  the standard of medical care.

## 2022-06-02 ENCOUNTER — Encounter: Payer: Self-pay | Admitting: Internal Medicine

## 2022-06-02 ENCOUNTER — Ambulatory Visit: Payer: Medicaid Other | Attending: Internal Medicine | Admitting: Internal Medicine

## 2022-06-02 VITALS — BP 125/84 | Resp 16 | Ht 67.0 in | Wt 167.8 lb

## 2022-06-02 DIAGNOSIS — I7300 Raynaud's syndrome without gangrene: Secondary | ICD-10-CM

## 2022-06-02 DIAGNOSIS — Z79899 Other long term (current) drug therapy: Secondary | ICD-10-CM

## 2022-06-02 DIAGNOSIS — M329 Systemic lupus erythematosus, unspecified: Secondary | ICD-10-CM | POA: Diagnosis not present

## 2022-06-02 DIAGNOSIS — M7989 Other specified soft tissue disorders: Secondary | ICD-10-CM

## 2022-06-03 ENCOUNTER — Other Ambulatory Visit: Payer: Self-pay

## 2022-06-03 DIAGNOSIS — R7989 Other specified abnormal findings of blood chemistry: Secondary | ICD-10-CM

## 2022-06-03 LAB — CBC WITH DIFFERENTIAL/PLATELET
Absolute Monocytes: 278 cells/uL (ref 200–950)
Basophils Absolute: 78 cells/uL (ref 0–200)
Basophils Relative: 0.9 %
Eosinophils Absolute: 226 cells/uL (ref 15–500)
Eosinophils Relative: 2.6 %
HCT: 40.8 % (ref 35.0–45.0)
Hemoglobin: 13.9 g/dL (ref 11.7–15.5)
Lymphs Abs: 3393 cells/uL (ref 850–3900)
MCH: 31.8 pg (ref 27.0–33.0)
MCHC: 34.1 g/dL (ref 32.0–36.0)
MCV: 93.4 fL (ref 80.0–100.0)
MPV: 10.2 fL (ref 7.5–12.5)
Monocytes Relative: 3.2 %
Neutro Abs: 4724 cells/uL (ref 1500–7800)
Neutrophils Relative %: 54.3 %
Platelets: 408 10*3/uL — ABNORMAL HIGH (ref 140–400)
RBC: 4.37 10*6/uL (ref 3.80–5.10)
RDW: 14.8 % (ref 11.0–15.0)
Total Lymphocyte: 39 %
WBC: 8.7 10*3/uL (ref 3.8–10.8)

## 2022-06-03 LAB — COMPLETE METABOLIC PANEL WITH GFR
AG Ratio: 2 (calc) (ref 1.0–2.5)
ALT: 162 U/L — ABNORMAL HIGH (ref 6–29)
AST: 143 U/L — ABNORMAL HIGH (ref 10–30)
Albumin: 4.7 g/dL (ref 3.6–5.1)
Alkaline phosphatase (APISO): 77 U/L (ref 31–125)
BUN: 10 mg/dL (ref 7–25)
CO2: 25 mmol/L (ref 20–32)
Calcium: 8.3 mg/dL — ABNORMAL LOW (ref 8.6–10.2)
Chloride: 103 mmol/L (ref 98–110)
Creat: 0.78 mg/dL (ref 0.50–0.97)
Globulin: 2.4 g/dL (calc) (ref 1.9–3.7)
Glucose, Bld: 76 mg/dL (ref 65–99)
Potassium: 4.3 mmol/L (ref 3.5–5.3)
Sodium: 139 mmol/L (ref 135–146)
Total Bilirubin: 0.8 mg/dL (ref 0.2–1.2)
Total Protein: 7.1 g/dL (ref 6.1–8.1)
eGFR: 100 mL/min/{1.73_m2} (ref >=60)

## 2022-06-03 LAB — PROTEIN / CREATININE RATIO, URINE
Creatinine, Urine: 120 mg/dL (ref 20–275)
Protein/Creat Ratio: 83 mg/g creat (ref 24–184)
Protein/Creatinine Ratio: 0.083 mg/mg creat (ref 0.024–0.184)
Total Protein, Urine: 10 mg/dL (ref 5–24)

## 2022-06-03 LAB — SEDIMENTATION RATE: Sed Rate: 19 mm/h (ref 0–20)

## 2022-06-03 LAB — ANTI-DNA ANTIBODY, DOUBLE-STRANDED: ds DNA Ab: 59 IU/mL — ABNORMAL HIGH

## 2022-06-03 NOTE — Progress Notes (Signed)
Sedimentation rate is normal now. No evidence of increased urine protein. Her liver enzyme tests are increased. I suspect this is caused by the methotrexate and she will need to discontinue the medication. I recommend we recheck her liver function tests 2 weeks after stopping the methotrexate and make sure this improves.

## 2022-06-06 NOTE — Telephone Encounter (Signed)
Does pt still want to pursue? If so, call back with BIN # PCN # [id number] # MEMBER ID from insurance. Aware pt was called but no answer Ref # [id number]

## 2022-06-07 ENCOUNTER — Telehealth: Payer: Self-pay

## 2022-06-07 ENCOUNTER — Other Ambulatory Visit (HOSPITAL_COMMUNITY): Payer: Self-pay

## 2022-06-07 NOTE — Telephone Encounter (Signed)
Patient Advocate Encounter  Prior Authorization for Rexulti 2MG  tablets has been approved.    PA# Key: [id number]  Effective dates: 06/07/22 through 06/07/23

## 2022-06-07 NOTE — Telephone Encounter (Signed)
Patient Advocate Encounter  Prior Authorization for Rexulti 2MG  tablets has been approved.    PA# Key: [id number]  Effective dates: 06/07/2022 through 06/07/2023

## 2022-06-07 NOTE — Telephone Encounter (Signed)
Pharmacy Patient Advocate Encounter   Received notification from [facility] that prior authorization for Rexulti 2MG  tablets is required/requested.   PA submitted on 06/07/22 to [employer] [place] [employer] via CoverMyMeds [facility]  Status is pending

## 2022-06-12 ENCOUNTER — Encounter: Payer: Self-pay | Admitting: Internal Medicine

## 2022-06-13 NOTE — Telephone Encounter (Signed)
I am fine with having labs drawn at outside facility. Needs LFTs checked due to abnormal LFTs on methotrexate.  Please send/order this for [facility] there.

## 2022-06-13 NOTE — Addendum Note (Signed)
Addended by: Nisaiah Bechtol L on: 06/13/2022 01:16 PM   Modules accepted: Orders  

## 2022-06-13 NOTE — Addendum Note (Signed)
Addended by: Henriette Combs on: 06/13/2022 01:16 PM   Modules accepted: Orders

## 2022-06-15 ENCOUNTER — Other Ambulatory Visit: Payer: Medicaid Other

## 2022-06-15 DIAGNOSIS — R7989 Other specified abnormal findings of blood chemistry: Secondary | ICD-10-CM | POA: Diagnosis not present

## 2022-06-16 LAB — AST: AST: 17 IU/L (ref 0–40)

## 2022-06-16 LAB — ALT: ALT: 16 IU/L (ref 0–32)

## 2022-06-17 ENCOUNTER — Ambulatory Visit: Payer: Medicaid Other | Admitting: Cardiology

## 2022-07-05 ENCOUNTER — Other Ambulatory Visit (HOSPITAL_COMMUNITY): Payer: Self-pay

## 2022-07-11 ENCOUNTER — Ambulatory Visit: Payer: Medicaid Other | Admitting: Cardiology

## 2022-07-11 NOTE — Progress Notes (Deleted)
Clinical Summary Valerie Ayala is a 37 y.o.female  Palpitations -started about 1 year ago - racing/pounding with activities.  - high caffeine intake 8 mellow yellows, no coffee, no tea, no energy drinks, no etoh - recent TSH 0.058   2. Leg edema - started about 2 years 2022 venous reflux [place] left leg only no DVt, +venous reflux - bilateral leg, abdominal distension - occasional SOB with activities, some fatigue.  - some occasional SOB with laying flat at night.     3. SLE - followed by rheum   4. Hisotry of hyperthyroid - prior thyroidectomy - last TSH 0.058     5. Hypersomnolence   6. Dizziness - occurs with standing often - mainly drinks sodas during the day   SH: has 2 sons 34 and 16 Past Medical History:  Diagnosis Date   Anxiety    Takes xanax twice daily   ASCUS with positive high risk HPV cervical 09/15/2021   08/2021 +HPV 16/0ther ASCUS, needs colpo, immediate risk CIN3+ 9%   Asthma    Chronic headaches    "multiple times a day; they say it's related to thyroid"   Complication of anesthesia    Dysrhythmia    tachycardia   Grave's disease    H/O seasonal allergies    Headache(784.0)    most days   Pneumonia 11/02/2011   "years ago"   PONV (postoperative nausea and vomiting)    Systemic lupus erythematosus ([facility])    Thyroid disease      No Known Allergies   Current Outpatient Medications  Medication Sig Dispense Refill   albuterol (PROVENTIL HFA;VENTOLIN HFA) 108 (90 BASE) MCG/ACT inhaler Inhale 2 puffs into the lungs every 6 (six) hours as needed. For shortness of breath     atorvastatin (LIPITOR) 80 MG tablet Take 1 tablet (80 mg total) by mouth daily. 90 tablet 1   brexpiprazole (REXULTI) 2 MG TABS tablet Take 1 tablet (2 mg total) by mouth daily. 90 tablet 0   cholecalciferol (VITAMIN D) 1000 UNITS tablet Take 1,000 Units by mouth daily.      folic acid (FOLVITE) 1 MG tablet Take 1 tablet (1 mg total) by mouth daily. 90 tablet 0    levothyroxine (SYNTHROID) 150 MCG tablet Take 1 tablet (150 mcg total) by mouth daily. 90 tablet 3   medroxyPROGESTERone (DEPO-PROVERA) 150 MG/ML injection Inject 1 mL (150 mg total) into the muscle every 3 (three) months. 1 mL 3   methotrexate (RHEUMATREX) 2.5 MG tablet Take 6 tablets (15 mg total) by mouth once a week. Caution:Chemotherapy. Protect from light. 72 tablet 0   Pseudoephedrine-Guaifenesin [phone] MG TB12 Take 1 tablet by mouth 2 (two) times daily. For congestion 12 tablet 0   sertraline (ZOLOFT) 100 MG tablet Take 1 tablet (100 mg total) by mouth daily. 30 tablet 2   varenicline (CHANTIX) 1 MG tablet Take 1 tablet (1 mg total) by mouth 2 (two) times daily. 56 tablet 1   No current facility-administered medications for this visit.     Past Surgical History:  Procedure Laterality Date   THYROIDECTOMY  11/02/2011   Procedure: THYROIDECTOMY;  Surgeon: Izora Gala, MD;  Location: [facility];  Service: ENT;  Laterality: Bilateral;  TOTAL THYROIDECTOMY   TOTAL THYROIDECTOMY  11/02/11   WISDOM TOOTH EXTRACTION  2004     No Known Allergies    Family History  Problem Relation Age of Onset   Hypertension Father    Hyperlipidemia Father  Hypothyroidism Father    Hypertension Mother    Hyperlipidemia Mother    Diabetes Mother    Healthy Sister    Anesthesia problems Neg Hx    Hypotension Neg Hx    Malignant hyperthermia Neg Hx    Pseudochol deficiency Neg Hx      Social History Valerie Ayala reports that she has been smoking cigarettes. She has a 12.00 pack-year smoking history. She has been exposed to tobacco smoke. She has never used smokeless tobacco. Valerie Ayala reports that she does not currently use alcohol.   Review of Systems CONSTITUTIONAL: No weight loss, fever, chills, weakness or fatigue.  HEENT: Eyes: No visual loss, blurred vision, double vision or yellow sclerae.No hearing loss, sneezing, congestion, runny nose or sore throat.  SKIN: No rash or itching.   CARDIOVASCULAR:  RESPIRATORY: No shortness of breath, cough or sputum.  GASTROINTESTINAL: No anorexia, nausea, vomiting or diarrhea. No abdominal pain or blood.  GENITOURINARY: No burning on urination, no polyuria NEUROLOGICAL: No headache, dizziness, syncope, paralysis, ataxia, numbness or tingling in the extremities. No change in bowel or bladder control.  MUSCULOSKELETAL: No muscle, back pain, joint pain or stiffness.  LYMPHATICS: No enlarged nodes. No history of splenectomy.  PSYCHIATRIC: No history of depression or anxiety.  ENDOCRINOLOGIC: No reports of sweating, cold or heat intolerance. No polyuria or polydipsia.  Marland Kitchen   Physical Examination There were no vitals filed for this visit. There were no vitals filed for this visit.  Gen: resting comfortably, no acute distress HEENT: no scleral icterus, pupils equal round and reactive, no palptable cervical adenopathy,  CV Resp: Clear to auscultation bilaterally GI: abdomen is soft, non-tender, non-distended, normal bowel sounds, no hepatosplenomegaly MSK: extremities are warm, no edema.  Skin: warm, no rash Neuro:  no focal deficits Psych: appropriate affect   Diagnostic Studies  11/2021 echo 1. Left ventricular ejection fraction, by estimation, is 60 to 65%. The  left ventricle has normal function. The left ventricle has no regional  wall motion abnormalities. Left ventricular diastolic parameters were  normal. The average left ventricular  global longitudinal strain is -22.6 %. The global longitudinal strain is  normal.   2. Right ventricular systolic function is normal. The right ventricular  size is normal. There is normal pulmonary artery systolic pressure. The  estimated right ventricular systolic pressure is [id number] mmHg.   3. The mitral valve is grossly normal. Trivial mitral valve  regurgitation.   4. The aortic valve is tricuspid. Aortic valve regurgitation is not  visualized.   5. The inferior vena cava is normal in  size with greater than 50%  respiratory variability, suggesting right atrial pressure of 3 mmHg.    Assessment and Plan   1.Palpitations - likely related to hyperthyroid state, high caffeien intake - thyroid management per pcp, discussed weaning caffeine - dont' see strong role for monitor at this time as even if arrhythmia was detected first step would be to get thyroid under control - low normal bp's today, don't think would tolerate low dose beta blocker or CCB - EKG today shows sinus tach 108   2. Leg edema/SOB - will obtain echo   3. Dizziness -orthostatic dizziness. Poor oral hydration, mainly drinks sodas during the day. Encouraged increased water intake     Arnoldo Lenis, M.D., F.A.C.C.

## 2022-07-13 ENCOUNTER — Encounter: Payer: Self-pay | Admitting: Cardiology

## 2022-07-27 ENCOUNTER — Ambulatory Visit: Payer: Medicaid Other | Admitting: Family Medicine

## 2022-07-27 ENCOUNTER — Encounter: Payer: Self-pay | Admitting: Family Medicine

## 2022-07-27 VITALS — BP 124/86 | HR 103 | Ht 67.0 in | Wt 178.0 lb

## 2022-07-27 DIAGNOSIS — E663 Overweight: Secondary | ICD-10-CM

## 2022-07-27 DIAGNOSIS — E782 Mixed hyperlipidemia: Secondary | ICD-10-CM

## 2022-07-27 DIAGNOSIS — F411 Generalized anxiety disorder: Secondary | ICD-10-CM

## 2022-07-27 DIAGNOSIS — F331 Major depressive disorder, recurrent, moderate: Secondary | ICD-10-CM | POA: Diagnosis not present

## 2022-07-27 DIAGNOSIS — E890 Postprocedural hypothyroidism: Secondary | ICD-10-CM | POA: Diagnosis not present

## 2022-07-27 DIAGNOSIS — G479 Sleep disorder, unspecified: Secondary | ICD-10-CM | POA: Diagnosis not present

## 2022-07-27 MED ORDER — SERTRALINE HCL 100 MG PO TABS: 150.0000 mg | ORAL_TABLET | Freq: Every day | ORAL | 1 refills | Status: DC

## 2022-07-27 MED ORDER — SAXENDA 18 MG/3ML ~~LOC~~ SOPN: PEN_INJECTOR | SUBCUTANEOUS | 0 refills | Status: AC

## 2022-07-27 MED ORDER — SERTRALINE HCL 100 MG PO TABS: 100.0000 mg | ORAL_TABLET | Freq: Every day | ORAL | 1 refills | Status: DC

## 2022-07-27 NOTE — Addendum Note (Signed)
Addended by: Alphonzo Dublin on: 07/27/2022 02:54 PM   Modules accepted: Orders

## 2022-07-27 NOTE — Addendum Note (Signed)
Addended by: Caryl Pina on: 07/27/2022 04:17 PM   Modules accepted: Orders

## 2022-07-27 NOTE — Progress Notes (Addendum)
BP 124/86   Pulse (!) 103   Ht '5\' 7"'$  (1.702 m)   Wt 178 lb (80.7 kg)   SpO2 100%   BMI 27.88 kg/m    Subjective:   Patient ID: Valerie Ayala, female    DOB: April 07, 1986, 37 y.o.   MRN: [id number]  HPI: Valerie Ayala is a 37 y.o. female presenting on 07/27/2022 for Medical Management of Chronic Issues, Anxiety, chest congestion (And nasal congestion), and Generalized Body Aches (D/C MTX 2+ mnths ago per Rheum. Pt is taking Suboxone '4mg'$  daily. Would like an Rx for it.)   HPI Anxiety depression recheck Patient is coming today for anxiety depression recheck.  She says she does feel better with the Zoloft but would like to increase it to 150 mg.  She is still taking the [place] as well.  She denies any suicidal ideations but still has some days where she is more down.    07/27/2022    1:45 PM 05/25/2022    1:18 PM 03/24/2022    9:07 AM 11/19/2021   10:15 AM 09/08/2021   11:00 AM  Depression screen PHQ 2/9  Decreased Interest '1 1 3 2 1  '$ Down, Depressed, Hopeless '1 1 3 1 1  '$ PHQ - 2 Score '2 2 6 3 2  '$ Altered sleeping '3 3 3 3 3  '$ Tired, decreased energy '3 3 3 3 3  '$ Change in appetite '3 2 3 1 1  '$ Feeling bad or failure about yourself  '2 2 3 1 1  '$ Trouble concentrating '2 2 2 1 1  '$ Moving slowly or fidgety/restless 2 0 2 1 0  Suicidal thoughts 1 0 2 0 0  PHQ-9 Score '18 14 24 13 11  '$ Difficult doing work/chores Somewhat difficult Somewhat difficult Extremely dIfficult Very difficult     Hypothyroidism recheck Patient is coming in for thyroid recheck today as well. They deny any issues with hair changes or heat or cold problems or diarrhea or constipation. They deny any chest pain or palpitations. They are currently on levothyroxine 150 micrograms   Hyperlipidemia Patient is coming in for recheck of his hyperlipidemia. The patient is currently taking Lipitor. They deny any issues with myalgias or history of liver damage from it. They deny any focal numbness or weakness or chest pain.     Relevant  past medical, surgical, family and social history reviewed and updated as indicated. Interim medical history since our last visit reviewed. Allergies and medications reviewed and updated.  Review of Systems  Constitutional:  Negative for chills and fever.  HENT:  Positive for congestion. Negative for ear discharge and ear pain.   Eyes:  Negative for redness and visual disturbance.  Respiratory:  Positive for cough. Negative for chest tightness and shortness of breath.   Cardiovascular:  Negative for chest pain and leg swelling.  Genitourinary:  Negative for difficulty urinating and dysuria.  Musculoskeletal:  Negative for back pain and gait problem.  Skin:  Negative for rash.  Neurological:  Negative for light-headedness and headaches.  Psychiatric/Behavioral:  Negative for agitation and behavioral problems.   All other systems reviewed and are negative.   Per HPI unless specifically indicated above   Allergies as of 07/27/2022   No Known Allergies      Medication List        Accurate as of July 27, 2022  4:15 PM. If you have any questions, ask your nurse or doctor.          STOP  taking these medications    methotrexate 2.5 MG tablet Commonly known as: RHEUMATREX Stopped by: Fransisca Kaufmann Lizzeth Meder, MD       TAKE these medications    albuterol 108 (90 Base) MCG/ACT inhaler Commonly known as: VENTOLIN HFA Inhale 2 puffs into the lungs every 6 (six) hours as needed. For shortness of breath   atorvastatin 80 MG tablet Commonly known as: LIPITOR Take 1 tablet (80 mg total) by mouth daily.   brexpiprazole 2 MG Tabs tablet Commonly known as: Rexulti Take 1 tablet (2 mg total) by mouth daily.   cholecalciferol 1000 units tablet Commonly known as: VITAMIN D Take 1,000 Units by mouth daily.   folic acid 1 MG tablet Commonly known as: FOLVITE Take 1 tablet (1 mg total) by mouth daily.   levothyroxine 150 MCG tablet Commonly known as: SYNTHROID Take 1 tablet (150  mcg total) by mouth daily.   medroxyPROGESTERone 150 MG/ML injection Commonly known as: DEPO-PROVERA Inject 1 mL (150 mg total) into the muscle every 3 (three) months.   Pseudoephedrine-Guaifenesin [phone] MG Tb12 Take 1 tablet by mouth 2 (two) times daily. For congestion   Saxenda 18 MG/3ML Sopn Generic drug: Liraglutide -Weight Management Inject 0.6 mg into the skin daily in the afternoon for 7 days, THEN 1.2 mg daily in the afternoon for 7 days, THEN 1.8 mg daily in the afternoon for 7 days, THEN 2.4 mg daily in the afternoon for 7 days, THEN 3 mg daily in the afternoon. Start taking on: July 27, 2022 Started by: Fransisca Kaufmann Grabiela Wohlford, MD   sertraline 100 MG tablet Commonly known as: Zoloft Take 1.5 tablets (150 mg total) by mouth daily. What changed: how much to take Changed by: Worthy Rancher, MD   varenicline 1 MG tablet Commonly known as: CHANTIX Take 1 tablet (1 mg total) by mouth 2 (two) times daily.         Objective:   BP 124/86   Pulse (!) 103   Ht '5\' 7"'$  (1.702 m)   Wt 178 lb (80.7 kg)   SpO2 100%   BMI 27.88 kg/m   Wt Readings from Last 3 Encounters:  07/27/22 178 lb (80.7 kg)  06/02/22 167 lb 12.8 oz (76.1 kg)  05/25/22 165 lb (74.8 kg)    Physical Exam Vitals and nursing note reviewed.  Constitutional:      General: She is not in acute distress.    Appearance: She is well-developed. She is not diaphoretic.  Eyes:     Conjunctiva/sclera: Conjunctivae normal.  Cardiovascular:     Rate and Rhythm: Normal rate and regular rhythm.     Heart sounds: Normal heart sounds. No murmur heard. Pulmonary:     Effort: Pulmonary effort is normal. No respiratory distress.     Breath sounds: Normal breath sounds. No wheezing.  Musculoskeletal:        General: No tenderness. Normal range of motion.  Skin:    General: Skin is warm and dry.     Findings: No rash.  Neurological:     Mental Status: She is alert and oriented to person, place, and time.      Coordination: Coordination normal.  Psychiatric:        Behavior: Behavior normal.       Assessment & Plan:   Problem List Items Addressed This Visit       Endocrine   Hypothyroidism   Relevant Orders   CBC with Differential/Platelet (Completed)   CMP14+EGFR (Completed)   Lipid  panel (Completed)   TSH (Completed)     Other   GAD (generalized anxiety disorder)   Relevant Medications   sertraline (ZOLOFT) 100 MG tablet   Other Relevant Orders   CBC with Differential/Platelet (Completed)   CMP14+EGFR (Completed)   Lipid panel (Completed)   TSH (Completed)   Major depression, recurrent ([place]) - Primary   Relevant Medications   sertraline (ZOLOFT) 100 MG tablet   Other Relevant Orders   CBC with Differential/Platelet (Completed)   CMP14+EGFR (Completed)   Lipid panel (Completed)   TSH (Completed)   Hyperlipidemia   Relevant Orders   CBC with Differential/Platelet (Completed)   CMP14+EGFR (Completed)   Lipid panel (Completed)   TSH (Completed)   Other Visit Diagnoses     Overweight (BMI 25.0-29.9)       Sleep disturbance           Mild congestion and cough and recommended for her to use Mucinex and Flonase and nasal saline.  Patient is having some sleep disturbance where she thinks she snores and her spouse thinks she may have stopped breathing at times, will do sleep apnea referral  Blood pressure and heart rate looks good.  Will recheck thyroid today along with other blood work.  Increase Zoloft to 150 mg for anxiety and depression.   Patient's BMI is >30 mg/m2.  Patient's current BMI is Body mass index is 27.88 kg/m.Marland Kitchen  Patient is currently enrolled in a healthy eating plan along with encouraged exercise.  Failed drug screen and other antidepressant use has contraindications to phentermine, Contrave & Qsymia (contains phentermine).  Patient does not have a personal or family history of medullary thyroid carcinoma (MTC) or Multiple Endocrine Neoplasia syndrome  type 2 (MEN 2).   Patient has been having some sleep disorder issues, she does not think she snores and maybe has apnea so she would like a sleep apnea referral. Follow up plan: Return in about 3 months (around 10/25/2022), or if symptoms worsen or fail to improve, for Anxiety depression and thyroid.  Counseling provided for all of the vaccine components Orders Placed This Encounter  Procedures   CBC with Differential/Platelet   CMP14+EGFR   Lipid panel   TSH    Caryl Pina, MD [facility] 07/27/2022, 4:15 PM

## 2022-07-28 LAB — CMP14+EGFR
ALT: 10 IU/L (ref 0–32)
AST: 14 IU/L (ref 0–40)
Albumin/Globulin Ratio: 1.8 (ref 1.2–2.2)
Albumin: 4.1 g/dL (ref 3.9–4.9)
Alkaline Phosphatase: 103 IU/L (ref 44–121)
BUN/Creatinine Ratio: 9 (ref 9–23)
BUN: 8 mg/dL (ref 6–20)
Bilirubin Total: 0.3 mg/dL (ref 0.0–1.2)
CO2: 22 mmol/L (ref 20–29)
Calcium: 7.8 mg/dL — ABNORMAL LOW (ref 8.7–10.2)
Chloride: 100 mmol/L (ref 96–106)
Creatinine, Ser: 0.89 mg/dL (ref 0.57–1.00)
Globulin, Total: 2.3 g/dL (ref 1.5–4.5)
Glucose: 69 mg/dL — ABNORMAL LOW (ref 70–99)
Potassium: 3.3 mmol/L — ABNORMAL LOW (ref 3.5–5.2)
Sodium: 140 mmol/L (ref 134–144)
Total Protein: 6.4 g/dL (ref 6.0–8.5)
eGFR: 85 mL/min/{1.73_m2} (ref >59)

## 2022-07-28 LAB — LIPID PANEL
Chol/HDL Ratio: 2.9 ratio (ref 0.0–4.4)
Cholesterol, Total: 93 mg/dL — ABNORMAL LOW (ref 100–199)
HDL: 32 mg/dL — ABNORMAL LOW (ref >39)
LDL Chol Calc (NIH): 40 mg/dL (ref 0–99)
Triglycerides: 117 mg/dL (ref 0–149)
VLDL Cholesterol Cal: 21 mg/dL (ref 5–40)

## 2022-07-28 LAB — CBC WITH DIFFERENTIAL/PLATELET
Basophils Absolute: 0.1 10*3/uL (ref 0.0–0.2)
Basos: 1 %
EOS (ABSOLUTE): 0.3 10*3/uL (ref 0.0–0.4)
Eos: 3 %
Hematocrit: 37.5 % (ref 34.0–46.6)
Hemoglobin: 13.2 g/dL (ref 11.1–15.9)
Immature Grans (Abs): 0.1 10*3/uL (ref 0.0–0.1)
Immature Granulocytes: 1 %
Lymphocytes Absolute: 4.6 10*3/uL — ABNORMAL HIGH (ref 0.7–3.1)
Lymphs: 46 %
MCH: 31.8 pg (ref 26.6–33.0)
MCHC: 35.2 g/dL (ref 31.5–35.7)
MCV: 90 fL (ref 79–97)
Monocytes Absolute: 0.5 10*3/uL (ref 0.1–0.9)
Monocytes: 6 %
Neutrophils Absolute: 4.2 10*3/uL (ref 1.4–7.0)
Neutrophils: 43 %
Platelets: 311 10*3/uL (ref 150–450)
RBC: 4.15 x10E6/uL (ref 3.77–5.28)
RDW: 13.1 % (ref 11.7–15.4)
WBC: 9.8 10*3/uL (ref 3.4–10.8)

## 2022-07-28 LAB — TSH: TSH: 4.97 u[IU]/mL — ABNORMAL HIGH (ref 0.450–4.500)

## 2022-07-29 ENCOUNTER — Telehealth: Payer: Self-pay

## 2022-07-29 ENCOUNTER — Encounter: Payer: Self-pay | Admitting: Family Medicine

## 2022-07-29 NOTE — Telephone Encounter (Signed)
Valerie Ayala (Key: [id number]) Rx #: [id number] Saxenda 18MG Fayne Mediate pen-injectors Form [employer] [place] Form [id number] NCPDP) Created 3 hours ago Sent to Plan 2 minutes ago Plan Response 2 minutes ago Submit Clinical Questions less than a minute ago Determination Wait for Determination Please wait for [facility] to return a determination.

## 2022-08-02 NOTE — Telephone Encounter (Signed)
Medication was denied by [place] (Key: [id number]) Rx #: [id number] Saxenda 18MG Fayne Mediate pen-injectors Form [employer] [place] Form [id number] NCPDP) Created 4 days ago Sent to Plan 4 days ago Plan Response 4 days ago Submit Clinical Questions 4 days ago Determination Unfavorable 4 days ago Message from Plan PA Case: [id number], Status: Denied. Notification: Completed.

## 2022-08-03 NOTE — Telephone Encounter (Signed)
Please let the patient know that her insurance denied coverage for Saxenda

## 2022-08-03 NOTE — Telephone Encounter (Signed)
Pt made aware and understood. She does not want to try anything else at this time.

## 2022-08-04 ENCOUNTER — Other Ambulatory Visit: Payer: Self-pay | Admitting: *Deleted

## 2022-08-04 MED ORDER — LEVOTHYROXINE SODIUM 175 MCG PO TABS: 175.0000 ug | ORAL_TABLET | Freq: Every day | ORAL | 1 refills | Status: DC

## 2022-08-05 ENCOUNTER — Encounter: Payer: Self-pay | Admitting: Internal Medicine

## 2022-08-05 ENCOUNTER — Ambulatory Visit: Payer: Medicaid Other | Attending: Internal Medicine | Admitting: Internal Medicine

## 2022-08-05 VITALS — BP 127/87 | HR 80 | Resp 16 | Ht 67.0 in | Wt 173.8 lb

## 2022-08-05 DIAGNOSIS — M797 Fibromyalgia: Secondary | ICD-10-CM

## 2022-08-05 DIAGNOSIS — M329 Systemic lupus erythematosus, unspecified: Secondary | ICD-10-CM | POA: Diagnosis not present

## 2022-08-05 DIAGNOSIS — Z79899 Other long term (current) drug therapy: Secondary | ICD-10-CM

## 2022-08-05 DIAGNOSIS — I7300 Raynaud's syndrome without gangrene: Secondary | ICD-10-CM | POA: Diagnosis not present

## 2022-08-05 NOTE — Patient Instructions (Addendum)
    I recommend checking out the [place] patient-centered guide for fibromyalgia and chronic pain management: [email or web address]

## 2022-08-05 NOTE — Progress Notes (Unsigned)
Office Visit Note  Patient: Valerie Ayala             Date of Birth: [birth year]           MRN: [id number]             PCP: Dettinger, Fransisca Kaufmann, MD Referring: Dettinger, Fransisca Kaufmann, MD Visit Date: 08/05/2022   Subjective:  Follow-up   History of Present Illness: LELSIE Ayala is a 37 y.o. female here for follow up for systemic lupus currently off treatment due to worsening in LFTs and lack of symptom response. She continues to have joint pain and swelling in hands and feet and skin rashes.  Raynaud's symptoms are worse during wintertime so far but not having any blistering or skin peeling changes.  Not much swelling back in her legs and no new mouth or nose ulcers.  Very severe fatigue is her most problematic symptom feels like she can sleep for 12 hours or more without having improvement in energy she has been referred for sleep study.   Previous HPI 06/02/22  Valerie Ayala is a 37 y.o. female here for follow up for SLE after last visit discontinued benlysta due to loss of efficacy and starting methotrexate 15 mg PO weekly and folic acid 1 mg daily and continuing HCQ 300 mg daily. Also 2 week prednisone taper for general exacerbation of symptoms at that time likely triggered by preceding URI. Since then she continues to feel considerable joint pain and stiffness. Most affected in her bilateral shoulders and knees but also with hand pain limiting some activities such as coloring. Symptoms are worst overnight and for a few hours each morning. She had at least one episode of bilateral leg swelling lasting several days.    Previous HPI 03/22/22 Valerie Ayala is a 37 y.o. female here for follow up for SLE on HCQ 300 mg daily and benlysta 200 mg [place] weekly. She is currently feeling worse pretty much all over. Especially joint pains and fatigue and increased skin rashes. She had an upper respiratory illness recently she recovered since about a week ago but has other symptoms are all worse since then. Leg  swelling is doing okay and no new mouth and nose ulcers.   Previous HPI 10/14/2021 Valerie Ayala is a 37 y.o. female here for follow up for SLE on HCQ 300 mg daily and Benlysta 200 mg [place] weekly. At last visit significant worsening in lower extremity swelling but normal lab markers and negative for proteinuria.  Since the last visit he continues to do very poorly.  She has ongoing leg swelling itching sometimes there is redness discoloration.  Newer problem is feeling some proximal leg weakness occasional sense of her legs giving out or locking up when walking.  She is extremely fatigued almost every day.  Sometimes she cannot even get out of bed for hours and her mood is very poor due to decreased ability to do anything.  She is also been noticing some palpitations and lightheadedness getting short of breath with very brief or mild exertion.     Previous HPI 06/15/21 Valerie Ayala is a 37 y.o. female here for follow up for SLE on HCQ 300 mg daily and Benlysta 200 mg [place] weekly.  She was doing very well after our last visit until about a month ago now feels everything is horrible.  She has significant bilateral leg swelling with pitting edema developing increased tenderness and painful burning sensation with discolorations  and skin changes in both legs.  She feels very fatigued.  Some skin dryness and peeling on the hands but no significant erythematous rash outside of the lower legs.  She recently also had flu with generalized body pains prescribed very large anterior cervical adenopathy.  This improved though she still has a residual ulcer in the mouth. Labs earlier this month showing very low TSH level with recent slight downward adjustment of her thyroid replacement.  She quit smoking with use of Chantix.   Previous HPI 12/08/20 Valerie Ayala is a 37 y.o. female here for follow up for systemic lupus on hydroxychloroquine 300 mg p.o. daily. No signfiicant improvement since starting hydroxychloroquine 8 weeks  ago.  She has not yet seen ophthalmology for retinal exam but plans to do so. Leg swelling continues to come and go, residual erythema and pain and burning sensations.  She continues to feel extremely fatigued as the most noticeable symptom.  She is also had a few mouth sores headaches and swollen glands but also describes the ongoing leg swelling sometimes with pain and discoloration.  Currently her left distal leg is most affected today.   Lupus manifestations Fatigue Arthralgias Photosensitive rash Oral ulcers Raynaud's   Lupus serology ANA 1:80 speckled dsDNA 49 Complement C3, C4 wnl Vit D 19   Review of Systems  Constitutional:  Positive for fatigue.  HENT:  Positive for mouth dryness. Negative for mouth sores.   Eyes:  Positive for dryness.  Respiratory:  Positive for shortness of breath.   Cardiovascular:  Positive for palpitations. Negative for chest pain.  Gastrointestinal:  Positive for constipation. Negative for blood in stool and diarrhea.  Endocrine: Negative.  Negative for increased urination.  Genitourinary: Negative.  Negative for involuntary urination.  Musculoskeletal:  Positive for joint pain, joint pain, joint swelling, muscle weakness, morning stiffness and muscle tenderness. Negative for gait problem.  Skin:  Positive for hair loss and sensitivity to sunlight. Negative for color change and rash.  Allergic/Immunologic: Positive for susceptible to infections.  Neurological:  Positive for headaches. Negative for dizziness.  Hematological:  Positive for swollen glands.  Psychiatric/Behavioral:  Positive for depressed mood. Negative for sleep disturbance. The patient is nervous/anxious.     PMFS History:  Patient Active Problem List   Diagnosis Date Noted   Fibromyalgia 08/08/2022   ASCUS with positive high risk HPV cervical 09/15/2021   History of abnormal cervical Pap smear 09/08/2021   Swelling of lower extremity 12/08/2020   High risk medication use  12/08/2020   Hyperlipidemia 12/03/2020   Hypocalcemia 09/07/2020   Vitamin D deficiency 09/07/2020   Systemic lupus ([place]) 08/21/2020   Dry mouth 08/21/2020   Tobacco use disorder 08/21/2020   Arthralgia 08/21/2020   Raynaud's phenomenon 08/21/2020   Major depression, recurrent ([place]) 04/26/2018   Adult ADHD 12/16/2014   Asthma 05/07/2013   Hypothyroidism 11/26/2012   GAD (generalized anxiety disorder) 11/26/2012    Past Medical History:  Diagnosis Date   Anxiety    Takes xanax twice daily   ASCUS with positive high risk HPV cervical 09/15/2021   08/2021 +HPV 16/0ther ASCUS, needs colpo, immediate risk CIN3+ 9%   Asthma    Chronic headaches    "multiple times a day; they say it's related to thyroid"   Complication of anesthesia    Dysrhythmia    tachycardia   Grave's disease    H/O seasonal allergies    Headache(784.0)    most days   Pneumonia 11/02/2011   "years  ago"   PONV (postoperative nausea and vomiting)    Systemic lupus erythematosus ([place])    Thyroid disease     Family History  Problem Relation Age of Onset   Hypertension Father    Hyperlipidemia Father    Hypothyroidism Father    Hypertension Mother    Hyperlipidemia Mother    Diabetes Mother    Healthy Sister    Anesthesia problems Neg Hx    Hypotension Neg Hx    Malignant hyperthermia Neg Hx    Pseudochol deficiency Neg Hx    Past Surgical History:  Procedure Laterality Date   THYROIDECTOMY  11/02/2011   Procedure: THYROIDECTOMY;  Surgeon: Izora Gala, MD;  Location: [facility];  Service: ENT;  Laterality: Bilateral;  TOTAL THYROIDECTOMY   TOTAL THYROIDECTOMY  11/02/11   WISDOM TOOTH EXTRACTION  2004   Social History   Social History Narrative   Not on file   Immunization History  Administered Date(s) Administered   DTaP 08/24/1984, 11/05/1984, 12/31/1984, 12/24/1985   Hepatitis B 04/26/1996, 05/31/1996, 08/30/1996   IPV 08/24/1984, 11/05/1984, 12/24/1985   Influenza,inj,Quad PF,6+ Mos 05/25/2020,  05/27/2021, 03/24/2022   Influenza-Unspecified 06/06/2011, 04/25/2012   MMR 12/24/1985   PPD Test 03/16/2015, 03/23/2015   Pneumococcal Polysaccharide-23 11/03/2011   Tdap 12/10/2010, 07/28/2021     Objective: Vital Signs: BP 127/87 (BP Location: Left Arm, Patient Position: Sitting, Cuff Size: Normal)   Pulse 80   Resp 16   Ht 5' 7"$  (1.702 m)   Wt 173 lb 12.8 oz (78.8 kg)   BMI 27.22 kg/m    Physical Exam Cardiovascular:     Rate and Rhythm: Normal rate and regular rhythm.  Pulmonary:     Effort: Pulmonary effort is normal.     Breath sounds: Normal breath sounds.  Lymphadenopathy:     Cervical: No cervical adenopathy.  Skin:    General: Skin is warm and dry.     Findings: Rash present.     Comments: Dry skin throughout No digital pitting or lesions Hyperpigmented skin changes worst on ram extensor surfaces and some areas on face  Neurological:     Mental Status: She is alert.  Psychiatric:        Mood and Affect: Mood normal.      Musculoskeletal Exam:  Neck full ROM no tenderness Shoulders full ROM soreness with abduction and external rotation, no palpable swelling Elbows full ROM no tenderness or swelling Wrists full ROM no tenderness or swelling Fingers full ROM tenderness to pressure without palpable synovitis Knees full ROM no tenderness or swelling Ankles full ROM tenderness to pressure on ankle and on distal leg and foot, trace edema   Investigation: No additional findings.  Imaging: No results found.  Recent Labs: Lab Results  Component Value Date   WBC 9.8 07/27/2022   HGB 13.2 07/27/2022   PLT 311 07/27/2022   NA 140 07/27/2022   K 3.3 (L) 07/27/2022   CL 100 07/27/2022   CO2 22 07/27/2022   GLUCOSE 69 (L) 07/27/2022   BUN 8 07/27/2022   CREATININE 0.89 07/27/2022   BILITOT 0.3 07/27/2022   ALKPHOS 103 07/27/2022   AST 14 07/27/2022   ALT 10 07/27/2022   PROT 6.4 07/27/2022   ALBUMIN 4.1 07/27/2022   CALCIUM 7.8 (L) 07/27/2022    GFRAA 80 02/27/2020   QFTBGOLDPLUS NEGATIVE 12/08/2020    Speciality Comments: PLQ eye exam has not been done yet Benlysta started 01/28/21  Procedures:  No procedures performed Allergies: Patient has no  known allergies.   Assessment / Plan:     Visit Diagnoses: Systemic lupus erythematosus, unspecified SLE type, unspecified organ involvement status ([place])  Ongoing systemic lupus symptoms off maintenance treatment due to lack of response and some medication intolerance problems.  Active symptoms at this time with arthralgias, Raynaud's, skin rash, and severe fatigue.  Serum inflammatory markers are not bad although persistent double-stranded DNA antibody positive.  Peripheral edema and mucosal ulcers are doing well. Will plan to start Saphnelo treatment, due to previous lack of response to hydroxychloroquine, methotrexate, and Benlysta.  Raynaud's phenomenon without gangrene  No new digital pitting or ulceration.  Symptoms are definitely not benefiting with cigarette smoking.  Not recommending addition of calcium channel blockers due to normal blood pressure and has had pretty severe pedal edema as several times previously  High risk medication use  Discussed risks of Saphnelo including infections, infusion reactions, or long term risk of cancer. Previously baseline screening reviewed.  Fibromyalgia  Suspect fibromyalgia syndrome is contributing to worsening of her overall symptoms in addition to ongoing inflammation.  She has recommended for sleep study from her PCP due to the persistent fatigue symptom.  Recommended she check out fibroguide resource on additional self-management recommendations.  Could consider addition of another medication in the future mother with some caution as she is on high dose of Zoloft long-term.   Orders: No orders of the defined types were placed in this encounter.  No orders of the defined types were placed in this encounter.    Follow-Up Instructions:  Return in about 3 months (around 11/03/2022) for SLE ?saphnelo f/u 61mo.   CCollier Salina MD  Note - This record has been created using [employer]  Chart creation errors have been sought, but may not always  have been located. Such creation errors do not reflect on  the standard of medical care.

## 2022-08-08 ENCOUNTER — Telehealth: Payer: Self-pay | Admitting: Pharmacist

## 2022-08-08 DIAGNOSIS — M797 Fibromyalgia: Secondary | ICD-10-CM | POA: Insufficient documentation

## 2022-08-08 NOTE — Telephone Encounter (Addendum)
Referral to [facility] placed in Dr. Marveen Reeks folder for signature  Dose: 321m IV every 4 weeks Labs: CBC w diff at 1 month then every 3 months  DKnox Saliva PharmD, MPH, BCPS, CPP Clinical Pharmacist (Rheumatology and Pulmonology)  ----- Message from CCollier Salina MD sent at 08/08/2022 12:47 AM EST ----- Regarding: Saphnelo Ms. Broxton continue to have ongoing symptoms and abnormal labs for active lupus so far tried hydroxychloroquine (increased rash), methotrexate (transaminase changes), benlysta [place] (lack of efficacy). I think she is a candidate for saphnelo infusion treatment so want to see about starting her on that.

## 2022-08-10 NOTE — Telephone Encounter (Signed)
Received signed provider form from Dr. Benjamine Mola.  Referral faxed to [place] for [facility]  Phone: [phone] Fax: [phone]  Knox Saliva, PharmD, MPH, BCPS, CPP Clinical Pharmacist (Rheumatology and Pulmonology)

## 2022-08-15 ENCOUNTER — Ambulatory Visit (INDEPENDENT_AMBULATORY_CARE_PROVIDER_SITE_OTHER): Payer: Medicaid Other

## 2022-08-15 ENCOUNTER — Telehealth: Payer: Self-pay | Admitting: Family Medicine

## 2022-08-15 ENCOUNTER — Encounter: Payer: Self-pay | Admitting: Family Medicine

## 2022-08-15 ENCOUNTER — Other Ambulatory Visit: Payer: Self-pay | Admitting: Family Medicine

## 2022-08-15 DIAGNOSIS — Z308 Encounter for other contraceptive management: Secondary | ICD-10-CM

## 2022-08-15 DIAGNOSIS — G479 Sleep disorder, unspecified: Secondary | ICD-10-CM

## 2022-08-15 DIAGNOSIS — Z3042 Encounter for surveillance of injectable contraceptive: Secondary | ICD-10-CM

## 2022-08-15 MED ORDER — MEDROXYPROGESTERONE ACETATE 150 MG/ML IM SUSP
150.0000 mg | Freq: Once | INTRAMUSCULAR | Status: AC
  Administered 2022-08-15: 150 mg via INTRAMUSCULAR

## 2022-08-15 NOTE — Progress Notes (Signed)
Pt tolerated injection well. She has no concerns. Pt did not want to make her next appt.

## 2022-08-15 NOTE — Telephone Encounter (Signed)
Pt's depo was given 11/20. Today is the last day to give next depo before a urine preg would be needed. Pt's son's have appts with Dettinger this pm. Will give depo during one of their appts. Pt scheduled on nurse triage schedule.

## 2022-08-17 MED ORDER — MEDROXYPROGESTERONE ACETATE 150 MG/ML IM SUSP
150.0000 mg | Freq: Once | INTRAMUSCULAR | Status: AC
  Administered 2022-11-25: 150 mg via INTRAMUSCULAR

## 2022-08-17 NOTE — Addendum Note (Signed)
Addended by: Alphonzo Dublin on: 08/17/2022 05:00 PM   Modules accepted: Orders

## 2022-08-31 DIAGNOSIS — M329 Systemic lupus erythematosus, unspecified: Secondary | ICD-10-CM | POA: Diagnosis not present

## 2022-09-01 ENCOUNTER — Telehealth: Payer: Self-pay | Admitting: Pharmacist

## 2022-09-01 NOTE — Telephone Encounter (Signed)
Received fax from [facility] regarding first [facility] infusion that patient received on 08/31/2022. Labs were not drawn.  Patient tolerated infusion without complications.   Note: Patient will be having multiple teeth extracted on 09/27/2022. Infusion center is requesting clearance letter from our office before her next Saphnelo infusion which is 09/28/2022.  Next SAPHNELO infusion scheduled for 09/28/2022 (currently but can be changed)  Knox Saliva, PharmD, MPH, BCPS, CPP Clinical Pharmacist (Rheumatology and Pulmonology)

## 2022-09-01 NOTE — Telephone Encounter (Signed)
Received fax from [facility] stating patient received first Saphnelo infusion on 08/31/2022  Knox Saliva, PharmD, MPH, BCPS, CPP Clinical Pharmacist (Rheumatology and Pulmonology)

## 2022-09-05 ENCOUNTER — Encounter: Payer: Self-pay | Admitting: Internal Medicine

## 2022-09-05 NOTE — Telephone Encounter (Signed)
I messaged Devki about this but I recommend she reschedule this to be 14 days after surgery or later to be as safe as possible. I believe [facility] can see our notes already. If she still needs a referral we can send one - recommending sleep study for excessive somnolence concern for narcolepsy or OSA

## 2022-09-05 NOTE — Telephone Encounter (Signed)
There is no great data on saphnelo perioperative nor for DMARDs with oral surgery so it's extrapolation. Surgery timing seems good to go ahead on 4/2, almost right when next infusion due. I recommend waiting till 14 days after surgery to resume infusion to minimize any increase in risk.

## 2022-09-06 NOTE — Telephone Encounter (Signed)
Clearance letter for Saphnelo infusion faxed to [facility]. Patient can receive Saphnelo on or after 10/11/2022  Fax: [phone]  Knox Saliva, PharmD, MPH, BCPS, CPP Clinical Pharmacist (Rheumatology and Pulmonology)

## 2022-09-12 ENCOUNTER — Other Ambulatory Visit: Payer: Self-pay | Admitting: Family Medicine

## 2022-09-12 DIAGNOSIS — Z716 Tobacco abuse counseling: Secondary | ICD-10-CM

## 2022-09-19 NOTE — Telephone Encounter (Signed)
I called patient, patient referred for sleep study by another provider, [facility] can see Dr. Marveen Reeks note in [facility] if needed.

## 2022-09-20 ENCOUNTER — Institutional Professional Consult (permissible substitution): Payer: Medicaid Other | Admitting: Neurology

## 2022-09-27 ENCOUNTER — Encounter: Payer: Self-pay | Admitting: *Deleted

## 2022-10-10 ENCOUNTER — Encounter: Payer: Self-pay | Admitting: Family Medicine

## 2022-10-11 DIAGNOSIS — M329 Systemic lupus erythematosus, unspecified: Secondary | ICD-10-CM | POA: Diagnosis not present

## 2022-10-12 ENCOUNTER — Telehealth: Payer: Self-pay | Admitting: Pharmacist

## 2022-10-12 NOTE — Telephone Encounter (Signed)
Received fax from [facility] regarding SAPHNELO infusion that patient received on 10/11/22. Labs were drawn - CBC w diff  Patient tolerated infusion without complications.  Changes/concerns since last visit:  none  Next SAPHNELO infusion scheduled for 11/08/22  Chesley Mires, PharmD, MPH, BCPS, CPP Clinical Pharmacist (Rheumatology and Pulmonology)

## 2022-10-17 ENCOUNTER — Encounter: Payer: Self-pay | Admitting: Neurology

## 2022-10-17 ENCOUNTER — Ambulatory Visit: Payer: Medicaid Other | Admitting: Neurology

## 2022-10-17 VITALS — BP 127/78 | HR 101 | Ht 67.0 in | Wt 193.2 lb

## 2022-10-17 DIAGNOSIS — R0681 Apnea, not elsewhere classified: Secondary | ICD-10-CM

## 2022-10-17 DIAGNOSIS — R0683 Snoring: Secondary | ICD-10-CM

## 2022-10-17 DIAGNOSIS — R519 Headache, unspecified: Secondary | ICD-10-CM

## 2022-10-17 DIAGNOSIS — R400 Somnolence: Secondary | ICD-10-CM

## 2022-10-17 DIAGNOSIS — G4719 Other hypersomnia: Secondary | ICD-10-CM | POA: Diagnosis not present

## 2022-10-17 DIAGNOSIS — Z820 Family history of epilepsy and other diseases of the nervous system: Secondary | ICD-10-CM | POA: Diagnosis not present

## 2022-10-17 DIAGNOSIS — E669 Obesity, unspecified: Secondary | ICD-10-CM | POA: Diagnosis not present

## 2022-10-17 DIAGNOSIS — R351 Nocturia: Secondary | ICD-10-CM

## 2022-10-17 DIAGNOSIS — E66811 Obesity, class 1: Secondary | ICD-10-CM

## 2022-10-17 DIAGNOSIS — R635 Abnormal weight gain: Secondary | ICD-10-CM

## 2022-10-17 NOTE — Progress Notes (Signed)
Subjective:    Patient ID: Valerie Ayala is a 37 y.o. female.  HPI    Valerie Foley, MD, PhD [facility] [address] [address] [place], [place] [address]  Dear Dr. Louanne Ayala,  I saw your patient, Valerie Ayala, upon your kind request in my sleep clinic today for initial consultation of her sleep disorder, in particular, concern for underlying obstructive sleep apnea.  The patient is accompanied by her 51 year old son today. She missed an appointment on 09/20/2022.  As you know, Valerie Ayala is a 37 year old female with an underlying medical history of SLE, Raynaud's syndrome, hyperlipidemia, vitamin D deficiency, fibromyalgia, asthma, hypothyroidism, anxiety, depression, and overweight state, who reports snoring and excessive daytime somnolence as well as witnessed apneas, per husband.  Her Epworth sleepiness score is 21 out of 24, fatigue severity score is 63 out of 63.  She feels that her lupus may be causing some of her fatigue.  I reviewed your office note from 07/27/2022. She has a family history of sleep apnea, her mom has a CPAP machine.  She admits to feeling sleepy at the wheel at times.  She has had occasional nocturnal headaches.  She has nocturia about 3 times per average night.  She goes to bed around 10 and rise time is around 6:30 AM.  She falls asleep inadvertently when she is sedentary.  She has had significant weight gain in the realm of 50 pounds within 6 months.  She has started having edema but a specific cause has not been found.  She has not been on a diuretic.  She drinks quite a bit of caffeine in the form of soda, about 4 cans/day and 2 cups of coffee in the morning.  She quit narcotic pain medication and finished Suboxone treatment in 2018.  She is working on smoking cessation and smokes less than half a pack per day.  She lives with her family including husband and 2 sons, ages 90 and 43.  She does not work.  She has 2 dogs in the household as well as  7 cats.  The dog sleeps in their bedroom, on the floor.  She has a TV on in her bedroom at night but it turns off on a sleep timer.  Of note, she is on potentially sedating medications including Rexulti and Zoloft.  She takes these medications at bedtime.  Her Past Medical History Is Significant For: Past Medical History:  Diagnosis Date   Anxiety    Takes xanax twice daily   ASCUS with positive high risk HPV cervical 09/15/2021   08/2021 +HPV 16/0ther ASCUS, needs colpo, immediate risk CIN3+ 9%   Asthma    Chronic headaches    "multiple times a day; they say it's related to thyroid"   Complication of anesthesia    Dysrhythmia    tachycardia   Fibromyalgia    Grave's disease    H/O seasonal allergies    Headache(784.0)    most days   Pneumonia 11/02/2011   "years ago"   PONV (postoperative nausea and vomiting)    Systemic lupus erythematosus    Thyroid disease     Her Past Surgical History Is Significant For: Past Surgical History:  Procedure Laterality Date   THYROIDECTOMY  11/02/2011   Procedure: THYROIDECTOMY;  Surgeon: Serena Colonel, MD;  Location: [facility] OR;  Service: ENT;  Laterality: Bilateral;  TOTAL THYROIDECTOMY   TOTAL THYROIDECTOMY  11/02/11   WISDOM TOOTH EXTRACTION  2004  Her Family History Is Significant For: Family History  Problem Relation Age of Onset   Hypertension Mother    Hyperlipidemia Mother    Diabetes Mother    Sleep apnea Mother    Hypertension Father    Hyperlipidemia Father    Hypothyroidism Father    Healthy Sister    Anesthesia problems Neg Hx    Hypotension Neg Hx    Malignant hyperthermia Neg Hx    Pseudochol deficiency Neg Hx     Her Social History Is Significant For: Social History   Socioeconomic History   Marital status: Married    Spouse name: Not on file   Number of children: Not on file   Years of education: Not on file   Highest education level: Not on file  Occupational History   Not on file  Tobacco Use   Smoking  status: Some Days    Packs/day: 0.50    Years: 12.00    Additional pack years: 0.00    Total pack years: 6.00    Types: Cigarettes    Last attempt to quit: 04/27/2021    Years since quitting: 1.4    Passive exposure: Current   Smokeless tobacco: Never  Vaping Use   Vaping Use: Former  Substance and Sexual Activity   Alcohol use: Not Currently   Drug use: No   Sexual activity: Yes    Birth control/protection: Injection  Other Topics Concern   Not on file  Social History Narrative   Not on file   Social Determinants of Health   Financial Resource Strain: Low Risk  (09/08/2021)   Overall Financial Resource Strain (CARDIA)    Difficulty of Paying Living Expenses: Not hard at all  Food Insecurity: No Food Insecurity (09/08/2021)   Hunger Vital Sign    Worried About Running Out of Food in the Last Year: Never true    Ran Out of Food in the Last Year: Never true  Transportation Needs: No Transportation Needs (09/08/2021)   PRAPARE - Administrator, Civil Service (Medical): No    Lack of Transportation (Non-Medical): No  Physical Activity: Insufficiently Active (09/08/2021)   Exercise Vital Sign    Days of Exercise per Week: 1 day    Minutes of Exercise per Session: 40 min  Stress: No Stress Concern Present (09/08/2021)   [employer] of Occupational Health - Occupational Stress Questionnaire    Feeling of Stress : Not at all  Social Connections: Moderately Isolated (09/08/2021)   Social Connection and Isolation Panel [NHANES]    Frequency of Communication with Friends and Family: Twice a week    Frequency of Social Gatherings with Friends and Family: Once a week    Attends Religious Services: Never    Database administrator or Organizations: No    Attends Banker Meetings: Never    Marital Status: Married    Her Allergies Are:  No Known Allergies:   Her Current Medications Are:  Outpatient Encounter Medications as of 10/17/2022  Medication Sig    albuterol (PROVENTIL HFA;VENTOLIN HFA) 108 (90 BASE) MCG/ACT inhaler Inhale 2 puffs into the lungs every 6 (six) hours as needed. For shortness of breath   Anifrolumab-fnia (SAPHNELO IV) Inject into the vein every 30 (thirty) days.   atorvastatin (LIPITOR) 80 MG tablet Take 1 tablet (80 mg total) by mouth daily.   brexpiprazole (REXULTI) 2 MG TABS tablet Take 1 tablet (2 mg total) by mouth daily.   cholecalciferol (VITAMIN D) 1000  UNITS tablet Take 1,000 Units by mouth daily.    levothyroxine (SYNTHROID) 175 MCG tablet Take 1 tablet (175 mcg total) by mouth daily.   medroxyPROGESTERone (DEPO-PROVERA) 150 MG/ML injection Inject 1 mL (150 mg total) into the muscle every 3 (three) months.   sertraline (ZOLOFT) 100 MG tablet Take 1.5 tablets (150 mg total) by mouth daily.   varenicline (CHANTIX) 1 MG tablet Take 1 tablet (1 mg total) by mouth 2 (two) times daily.   folic acid (FOLVITE) 1 MG tablet Take 1 tablet (1 mg total) by mouth daily.   Pseudoephedrine-Guaifenesin [phone] MG TB12 Take 1 tablet by mouth 2 (two) times daily. For congestion   Facility-Administered Encounter Medications as of 10/17/2022  Medication   medroxyPROGESTERone (DEPO-PROVERA) injection 150 mg  :   Review of Systems:  Out of a complete 14 point review of systems, all are reviewed and negative with the exception of these symptoms as listed below:  Review of Systems  Neurological:        Pt here for sleep consult Pt states snores,headaches,fatigue,hypertension Pt denies sleep study,CPAP machine  Pt states does have lupus and she thinks its causing some of her fatigue     ESS:21 FSS:63    Objective:  Neurological Exam  Physical Exam Physical Examination:   Vitals:   10/17/22 1103  BP: 127/78  Pulse: (!) 101    General Examination: The patient is a very pleasant 37 y.o. female in no acute distress. She appears well-developed and well-nourished and well groomed.   HEENT: Normocephalic, atraumatic, pupils  are equal, round and reactive to light, extraocular tracking is good without limitation to gaze excursion or nystagmus noted. Hearing is grossly intact. Face is symmetric with normal facial animation. Speech is clear with no dysarthria noted. There is no hypophonia. There is no lip, neck/head, jaw or voice tremor. Neck is supple with full range of passive and active motion. There are no carotid bruits on auscultation. Oropharynx exam reveals: moderate mouth dryness, marginal dental hygiene, several missing teeth, moderate airway crowding secondary to small airway, Mallampati class III, minimal overbite noted, tongue protrudes centrally and palate elevates symmetrically, tonsils on the smaller side.   Chest: Clear to auscultation without wheezing, rhonchi or crackles noted.  Heart: S1+S2+0, regular and normal without murmurs, rubs or gallops noted.   Abdomen: Soft, non-tender and non-distended.  Extremities: There is 1-2+ pitting edema in the distal lower extremities bilaterally.   Skin: Warm and dry without trophic changes noted.   Musculoskeletal: exam reveals no obvious joint deformities.   Neurologically:  Mental status: The patient is awake, alert and oriented in all 4 spheres. Her immediate and remote memory, attention, language skills and fund of knowledge are appropriate. There is no evidence of aphasia, agnosia, apraxia or anomia. Speech is clear with normal prosody and enunciation. Thought process is linear. Mood is normal and affect is normal.  Cranial nerves II - XII are as described above under HEENT exam.  Motor exam: Normal bulk, strength and tone is noted. There is no obvious action or resting tremor.  Fine motor skills and coordination: grossly intact.  Cerebellar testing: No dysmetria or intention tremor. There is no truncal or gait ataxia.  Sensory exam: intact to light touch in the upper and lower extremities.  Gait, station and balance: She stands easily. No veering to one  side is noted. No leaning to one side is noted. Posture is age-appropriate and stance is narrow based. Gait shows normal stride length and  normal pace. No problems turning are noted.   Assessment and plan:  In summary, Valerie Ayala is a very pleasant 37 y.o.-year old female with an underlying medical history of SLE, Raynaud's syndrome, hyperlipidemia, vitamin D deficiency, fibromyalgia, asthma, hypothyroidism, anxiety, depression, and overweight state, whose history and physical exam are concerning for sleep disordered breathing, supporting a current working diagnosis of unspecified sleep apnea, with the main differential diagnoses of obstructive sleep apnea (OSA) versus upper airway resistance syndrome (UARS) versus central sleep apnea (CSA), or mixed sleep apnea. A laboratory attended sleep study is typically considered "gold standard" for evaluation of sleep disordered breathing.   I had a long chat with the patient about my findings and the diagnosis of sleep apnea, particularly OSA, its prognosis and treatment options. We talked about medical/conservative treatments, surgical interventions and non-pharmacological approaches for symptom control. I explained, in particular, the risks and ramifications of untreated moderate to severe OSA, especially with respect to developing cardiovascular disease down the road, including congestive heart failure (CHF), difficult to treat hypertension, cardiac arrhythmias (particularly A-fib), neurovascular complications including TIA, stroke and dementia. Even type 2 diabetes has, in part, been linked to untreated OSA. Symptoms of untreated OSA may include (but may not be limited to) daytime sleepiness, nocturia (i.e. frequent nighttime urination), memory problems, mood irritability and suboptimally controlled or worsening mood disorder such as depression and/or anxiety, lack of energy, lack of motivation, physical discomfort, as well as recurrent headaches, especially  morning or nocturnal headaches. We talked about the importance of maintaining a healthy lifestyle and striving for healthy weight.  The importance of complete smoking cessation was also addressed.  In addition, we talked about the importance of striving for and maintaining good sleep hygiene.  She is strongly advised not to drive when feeling sleepy. I recommended a sleep study at this time. I outlined the differences between a laboratory attended sleep study which is considered more comprehensive and accurate over the option of a home sleep test (HST); the latter may lead to underestimation of sleep disordered breathing in some instances and does not help with diagnosing upper airway resistance syndrome and is not accurate enough to diagnose primary central sleep apnea typically. I outlined possible surgical and non-surgical treatment options of OSA, including the use of a positive airway pressure (PAP) device (i.e. CPAP, AutoPAP/APAP or BiPAP in certain circumstances), a custom-made dental device (aka oral appliance, which would require a referral to a specialist dentist or orthodontist typically, and is generally speaking not considered for patients with full dentures or edentulous state), upper airway surgical options, such as traditional UPPP (which is not considered a first-line treatment) or the Inspire device (hypoglossal nerve stimulator, which would involve a referral for consultation with an ENT surgeon, after careful selection, following inclusion criteria - also not first-line treatment). I explained the PAP treatment option to the patient in detail, as this is generally considered first-line treatment.  The patient indicated that she would be willing to try PAP therapy, if the need arises. I explained the importance of being compliant with PAP treatment, not only for insurance purposes but primarily to improve patient's symptoms symptoms, and for the patient's long term health benefit, including to  reduce Her cardiovascular risks longer-term.    We will pick up our discussion about the next steps and treatment options after testing.  We will keep her posted as to the test results by phone call and/or MyChart messaging where possible.  We will plan to follow-up in  sleep clinic accordingly as well.  I answered all her questions today and the patient was in agreement.   I encouraged her to call with any interim questions, concerns, problems or updates or email [place] through MyChart.  Generally speaking, sleep test authorizations may take up to 2 weeks, sometimes less, sometimes longer, the patient is encouraged to get in touch with [place] if they do not hear back from the sleep lab staff directly within the next 2 weeks.  Thank you very much for allowing me to participate in the care of this nice patient. If I can be of any further assistance to you please do not hesitate to call me at [phone].  Sincerely,   Valerie Foley, MD, PhD

## 2022-10-17 NOTE — Patient Instructions (Signed)

## 2022-10-21 ENCOUNTER — Telehealth: Payer: Self-pay | Admitting: Neurology

## 2022-10-21 NOTE — Telephone Encounter (Signed)
[facility] pending uploaded notes.

## 2022-10-24 NOTE — Telephone Encounter (Signed)
[facility] denied the NPSG.  Please see below for the reason. Do you want to do a p2p/appeal or a hst?

## 2022-10-24 NOTE — Telephone Encounter (Signed)
Noted, thank you HST pending with [facility].

## 2022-10-24 NOTE — Telephone Encounter (Signed)
HST ordered as an addendum to the office visit note. 

## 2022-10-24 NOTE — Addendum Note (Signed)
Addended by: Huston Foley on: 10/24/2022 11:50 AM   Modules accepted: Orders

## 2022-10-24 NOTE — Telephone Encounter (Signed)
Checked status on the portal it is still pending.  

## 2022-10-25 NOTE — Telephone Encounter (Signed)
HST- [facility] no auth req via fax form. Sent mychart message to patient.

## 2022-10-26 ENCOUNTER — Other Ambulatory Visit (HOSPITAL_COMMUNITY): Payer: Self-pay

## 2022-10-26 ENCOUNTER — Other Ambulatory Visit: Payer: Self-pay

## 2022-10-26 ENCOUNTER — Encounter: Payer: Self-pay | Admitting: Family Medicine

## 2022-10-26 ENCOUNTER — Ambulatory Visit: Payer: Medicaid Other | Admitting: Family Medicine

## 2022-10-26 VITALS — BP 108/77 | HR 111 | Ht 67.0 in | Wt 186.0 lb

## 2022-10-26 DIAGNOSIS — F331 Major depressive disorder, recurrent, moderate: Secondary | ICD-10-CM

## 2022-10-26 DIAGNOSIS — E890 Postprocedural hypothyroidism: Secondary | ICD-10-CM

## 2022-10-26 DIAGNOSIS — F411 Generalized anxiety disorder: Secondary | ICD-10-CM

## 2022-10-26 DIAGNOSIS — E782 Mixed hyperlipidemia: Secondary | ICD-10-CM | POA: Diagnosis not present

## 2022-10-26 DIAGNOSIS — E876 Hypokalemia: Secondary | ICD-10-CM | POA: Diagnosis not present

## 2022-10-26 MED ORDER — ATORVASTATIN CALCIUM 80 MG PO TABS
80.0000 mg | ORAL_TABLET | Freq: Every day | ORAL | 0 refills | Status: DC
  Filled 2022-10-26: qty 90, 90d supply, fill #0

## 2022-10-26 MED ORDER — SERTRALINE HCL 100 MG PO TABS
150.0000 mg | ORAL_TABLET | Freq: Every day | ORAL | 1 refills | Status: DC
Start: 2022-10-26 — End: 2022-11-25
  Filled 2022-10-26: qty 135, 90d supply, fill #0

## 2022-10-26 MED ORDER — BREXPIPRAZOLE 2 MG PO TABS
2.0000 mg | ORAL_TABLET | Freq: Every day | ORAL | 0 refills | Status: DC
  Filled 2022-10-26: qty 90, 90d supply, fill #0

## 2022-10-26 MED ORDER — LEVOTHYROXINE SODIUM 175 MCG PO TABS
175.0000 ug | ORAL_TABLET | Freq: Every day | ORAL | 1 refills | Status: DC
  Filled 2022-10-26: qty 90, 90d supply, fill #0

## 2022-10-26 NOTE — Progress Notes (Signed)
BP 108/77   Pulse (!) 111   Ht 5\' 7"  (1.702 m)   Wt 186 lb (84.4 kg)   SpO2 98%   BMI 29.13 kg/m    Subjective:   Patient ID: Valerie Ayala, female    DOB: 1985/10/08, 37 y.o.   MRN: [id number]  HPI: Valerie Ayala is a 37 y.o. female presenting on 10/26/2022 for Medical Management of Chronic Issues and Anxiety   HPI Hypothyroidism recheck Patient is coming in for thyroid recheck today as well. They deny any issues with hair changes or heat or cold problems or diarrhea or constipation. They deny any chest pain or palpitations. They are currently on levothyroxine 175 micrograms   Hyperlipidemia Patient is coming in for recheck of his hyperlipidemia. The patient is currently taking atorvastatin. They deny any issues with myalgias or history of liver damage from it. They deny any focal numbness or weakness or chest pain.   Anxiety depression recheck Patient is coming in for anxiety and depression recheck.  She is currently taking Zoloft and Rexulti.  She feels like she does okay, the only real issue that she has is that she is so irritable but she says her depression and feeling sad is a lot better and she denies any major issues with that.  She denies any suicidal ideations or thoughts of hurting herself.  She is working with rheumatologist and has been diagnosed with fibromyalgia and they are working on treatment for that.    10/26/2022   11:22 AM 07/27/2022    1:45 PM 05/25/2022    1:18 PM 03/24/2022    9:07 AM 11/19/2021   10:15 AM  Depression screen PHQ 2/9  Decreased Interest 1 1 1 3 2   Down, Depressed, Hopeless 1 1 1 3 1   PHQ - 2 Score 2 2 2 6 3   Altered sleeping 3 3 3 3 3   Tired, decreased energy 3 3 3 3 3   Change in appetite 3 3 2 3 1   Feeling bad or failure about yourself  1 2 2 3 1   Trouble concentrating 1 2 2 2 1   Moving slowly or fidgety/restless 1 2 0 2 1  Suicidal thoughts 0 1 0 2 0  PHQ-9 Score 14 18 14 24 13   Difficult doing work/chores Not difficult at all Somewhat  difficult Somewhat difficult Extremely dIfficult Very difficult     Relevant past medical, surgical, family and social history reviewed and updated as indicated. Interim medical history since our last visit reviewed. Allergies and medications reviewed and updated.  Review of Systems  Constitutional:  Positive for unexpected weight change. Negative for chills and fever.  Eyes:  Negative for redness and visual disturbance.  Respiratory:  Negative for chest tightness and shortness of breath.   Cardiovascular:  Negative for chest pain and leg swelling.  Genitourinary:  Negative for difficulty urinating and dysuria.  Musculoskeletal:  Positive for arthralgias, back pain and myalgias. Negative for gait problem.  Skin:  Negative for rash.  Neurological:  Negative for light-headedness and headaches.  Psychiatric/Behavioral:  Negative for agitation and behavioral problems.   All other systems reviewed and are negative.   Per HPI unless specifically indicated above   Allergies as of 10/26/2022   No Known Allergies      Medication List        Accurate as of Oct 26, 2022 11:43 AM. If you have any questions, ask your nurse or doctor.  STOP taking these medications    folic acid 1 MG tablet Commonly known as: FOLVITE Stopped by: Nils Pyle, MD   Pseudoephedrine-Guaifenesin [phone] MG Tb12 Stopped by: Elige Radon Cristoval Teall, MD       TAKE these medications    albuterol 108 (90 Base) MCG/ACT inhaler Commonly known as: VENTOLIN HFA Inhale 2 puffs into the lungs every 6 (six) hours as needed. For shortness of breath   atorvastatin 80 MG tablet Commonly known as: LIPITOR Take 1 tablet (80 mg total) by mouth daily.   brexpiprazole 2 MG Tabs tablet Commonly known as: Rexulti Take 1 tablet (2 mg total) by mouth daily.   cholecalciferol 1000 units tablet Commonly known as: VITAMIN D Take 1,000 Units by mouth daily.   levothyroxine 175 MCG tablet Commonly known  as: SYNTHROID Take 1 tablet (175 mcg total) by mouth daily.   medroxyPROGESTERone 150 MG/ML injection Commonly known as: DEPO-PROVERA Inject 1 mL (150 mg total) into the muscle every 3 (three) months.   SAPHNELO IV Inject into the vein every 30 (thirty) days.   sertraline 100 MG tablet Commonly known as: Zoloft Take 1.5 tablets (150 mg total) by mouth daily.   varenicline 1 MG tablet Commonly known as: CHANTIX Take 1 tablet (1 mg total) by mouth 2 (two) times daily.         Objective:   BP 108/77   Pulse (!) 111   Ht 5\' 7"  (1.702 m)   Wt 186 lb (84.4 kg)   SpO2 98%   BMI 29.13 kg/m   Wt Readings from Last 3 Encounters:  10/26/22 186 lb (84.4 kg)  10/17/22 193 lb 3.2 oz (87.6 kg)  08/05/22 173 lb 12.8 oz (78.8 kg)    Physical Exam Vitals and nursing note reviewed.  Constitutional:      General: She is not in acute distress.    Appearance: She is well-developed. She is not diaphoretic.  Eyes:     Conjunctiva/sclera: Conjunctivae normal.  Cardiovascular:     Rate and Rhythm: Normal rate and regular rhythm.     Heart sounds: Normal heart sounds. No murmur heard. Pulmonary:     Effort: Pulmonary effort is normal. No respiratory distress.     Breath sounds: Normal breath sounds. No wheezing, rhonchi or rales.  Musculoskeletal:        General: No swelling or tenderness. Normal range of motion.  Skin:    General: Skin is warm and dry.     Findings: No rash.  Neurological:     Mental Status: She is alert and oriented to person, place, and time.     Coordination: Coordination normal.  Psychiatric:        Behavior: Behavior normal.       Assessment & Plan:   Problem List Items Addressed This Visit       Endocrine   Hypothyroidism - Primary   Relevant Medications   levothyroxine (SYNTHROID) 175 MCG tablet   Other Relevant Orders   TSH     Other   GAD (generalized anxiety disorder)   Relevant Medications   brexpiprazole (REXULTI) 2 MG TABS tablet    sertraline (ZOLOFT) 100 MG tablet   Major depression, recurrent (HCC)   Relevant Medications   brexpiprazole (REXULTI) 2 MG TABS tablet   sertraline (ZOLOFT) 100 MG tablet   Hyperlipidemia   Relevant Medications   atorvastatin (LIPITOR) 80 MG tablet   Other Visit Diagnoses     Hypokalemia  Relevant Orders   CMP14+EGFR       Will check thyroid and chemistry panel.  Continues Zoloft and Rexulti. Follow up plan: Return in about 3 months (around 01/26/2023), or if symptoms worsen or fail to improve, for Thyroid and hyperlipidemia recheck.  Counseling provided for all of the vaccine components Orders Placed This Encounter  Procedures   CMP14+EGFR   TSH    Arville Care, MD [facility] 10/26/2022, 11:43 AM

## 2022-10-27 LAB — CMP14+EGFR
ALT: 9 IU/L (ref 0–32)
AST: 18 IU/L (ref 0–40)
Albumin/Globulin Ratio: 2.1 (ref 1.2–2.2)
Albumin: 4.9 g/dL (ref 3.9–4.9)
Alkaline Phosphatase: 105 IU/L (ref 44–121)
BUN/Creatinine Ratio: 12 (ref 9–23)
BUN: 13 mg/dL (ref 6–20)
Bilirubin Total: 0.6 mg/dL (ref 0.0–1.2)
CO2: 21 mmol/L (ref 20–29)
Calcium: 7.6 mg/dL — ABNORMAL LOW (ref 8.7–10.2)
Chloride: 98 mmol/L (ref 96–106)
Creatinine, Ser: 1.09 mg/dL — ABNORMAL HIGH (ref 0.57–1.00)
Globulin, Total: 2.3 g/dL (ref 1.5–4.5)
Glucose: 49 mg/dL — ABNORMAL LOW (ref 70–99)
Potassium: 4 mmol/L (ref 3.5–5.2)
Sodium: 139 mmol/L (ref 134–144)
Total Protein: 7.2 g/dL (ref 6.0–8.5)
eGFR: 67 mL/min/{1.73_m2} (ref >59)

## 2022-10-27 LAB — TSH: TSH: 20.5 u[IU]/mL — ABNORMAL HIGH (ref 0.450–4.500)

## 2022-11-01 ENCOUNTER — Encounter: Payer: Self-pay | Admitting: Internal Medicine

## 2022-11-03 NOTE — Progress Notes (Signed)
Office Visit Note  Patient: Valerie Ayala             Date of Birth: 19-Feb-1986           MRN: [id number]             PCP: Dettinger, Elige Radon, MD Referring: Dettinger, Elige Radon, MD Visit Date: 11/04/2022   Subjective:  Follow-up (Patient states she would like to talk about Naloxone medication.)   History of Present Illness: Valerie Ayala is a 37 y.o. female here for follow up for systemic lupus currently on treatment with Saphnelo infusions monthly first dose on March 6 second infusion slightly delayed to April 16 for perioperative management.  She reports noticing a definite change after infusion with improvement in her skin rashes swelling though she remains very fatigued.  Reports some waning of the benefit she noticed during the last week or 2 prior to repeat infusion.  But currently doing still doing pretty well from treatment on the 16th of last month.  She has questions about naloxone that she read read about regarding management of her fibromyalgia due to persistent body aches and fatigue.  Previous HPI 08/05/22 Valerie Ayala is a 37 y.o. female here for follow up for systemic lupus currently off treatment due to worsening in LFTs and lack of symptom response. She continues to have joint pain and swelling in hands and feet and skin rashes.  Raynaud's symptoms are worse during wintertime so far but not having any blistering or skin peeling changes.  Not much swelling back in her legs and no new mouth or nose ulcers.  Very severe fatigue is her most problematic symptom feels like she can sleep for 12 hours or more without having improvement in energy she has been referred for sleep study.   Previous HPI 06/02/22  Valerie Ayala is a 37 y.o. female here for follow up for SLE after last visit discontinued benlysta due to loss of efficacy and starting methotrexate 15 mg PO weekly and folic acid 1 mg daily and continuing HCQ 300 mg daily. Also 2 week prednisone taper for general exacerbation of  symptoms at that time likely triggered by preceding URI. Since then she continues to feel considerable joint pain and stiffness. Most affected in her bilateral shoulders and knees but also with hand pain limiting some activities such as coloring. Symptoms are worst overnight and for a few hours each morning. She had at least one episode of bilateral leg swelling lasting several days.    Previous HPI 03/22/22 Valerie Ayala is a 37 y.o. female here for follow up for SLE on HCQ 300 mg daily and benlysta 200 mg [place] weekly. She is currently feeling worse pretty much all over. Especially joint pains and fatigue and increased skin rashes. She had an upper respiratory illness recently she recovered since about a week ago but has other symptoms are all worse since then. Leg swelling is doing okay and no new mouth and nose ulcers.   Previous HPI 10/14/2021 Valerie Ayala is a 37 y.o. female here for follow up for SLE on HCQ 300 mg daily and Benlysta 200 mg [place] weekly. At last visit significant worsening in lower extremity swelling but normal lab markers and negative for proteinuria.  Since the last visit he continues to do very poorly.  She has ongoing leg swelling itching sometimes there is redness discoloration.  Newer problem is feeling some proximal leg weakness occasional sense of her legs  giving out or locking up when walking.  She is extremely fatigued almost every day.  Sometimes she cannot even get out of bed for hours and her mood is very poor due to decreased ability to do anything.  She is also been noticing some palpitations and lightheadedness getting short of breath with very brief or mild exertion.     Previous HPI 06/15/21 Valerie Ayala is a 37 y.o. female here for follow up for SLE on HCQ 300 mg daily and Benlysta 200 mg  weekly.  She was doing very well after our last visit until about a month ago now feels everything is horrible.  She has significant bilateral leg swelling with pitting edema developing  increased tenderness and painful burning sensation with discolorations and skin changes in both legs.  She feels very fatigued.  Some skin dryness and peeling on the hands but no significant erythematous rash outside of the lower legs.  She recently also had flu with generalized body pains prescribed very large anterior cervical adenopathy.  This improved though she still has a residual ulcer in the mouth. Labs earlier this month showing very low TSH level with recent slight downward adjustment of her thyroid replacement.  She quit smoking with use of Chantix.   Previous HPI 12/08/20 Valerie Ayala is a 37 y.o. female here for follow up for systemic lupus on hydroxychloroquine 300 mg p.o. daily. No signfiicant improvement since starting hydroxychloroquine 8 weeks ago.  She has not yet seen ophthalmology for retinal exam but plans to do so. Leg swelling continues to come and go, residual erythema and pain and burning sensations.  She continues to feel extremely fatigued as the most noticeable symptom.  She is also had a few mouth sores headaches and swollen glands but also describes the ongoing leg swelling sometimes with pain and discoloration.  Currently her left distal leg is most affected today.   Lupus manifestations Fatigue Arthralgias Photosensitive rash Oral ulcers Raynaud's   Lupus serology ANA 1:80 speckled dsDNA 49 Complement C3, C4 wnl Vit D 19   Review of Systems  Constitutional:  Positive for fatigue.  HENT:  Positive for mouth dryness. Negative for mouth sores.   Eyes:  Positive for dryness.  Respiratory:  Positive for shortness of breath.   Cardiovascular:  Negative for chest pain and palpitations.  Gastrointestinal:  Positive for constipation. Negative for blood in stool and diarrhea.  Endocrine: Positive for increased urination.  Genitourinary:  Negative for involuntary urination.  Musculoskeletal:  Positive for joint pain, gait problem, joint pain, joint swelling, myalgias,  muscle weakness, morning stiffness, muscle tenderness and myalgias.  Skin:  Positive for color change and sensitivity to sunlight. Negative for rash and hair loss.  Allergic/Immunologic: Positive for susceptible to infections.  Neurological:  Positive for dizziness and headaches.  Hematological:  Positive for swollen glands.  Psychiatric/Behavioral:  Positive for depressed mood. Negative for sleep disturbance. The patient is not nervous/anxious.     PMFS History:  Patient Active Problem List   Diagnosis Date Noted   Fibromyalgia 08/08/2022   ASCUS with positive high risk HPV cervical 09/15/2021   History of abnormal cervical Pap smear 09/08/2021   Swelling of lower extremity 12/08/2020   High risk medication use 12/08/2020   Hyperlipidemia 12/03/2020   Hypocalcemia 09/07/2020   Vitamin D deficiency 09/07/2020   Systemic lupus (HCC) 08/21/2020   Dry mouth 08/21/2020   Tobacco use disorder 08/21/2020   Arthralgia 08/21/2020   Raynaud's phenomenon 08/21/2020  Major depression, recurrent (HCC) 04/26/2018   Adult ADHD 12/16/2014   Asthma 05/07/2013   Hypothyroidism 11/26/2012   GAD (generalized anxiety disorder) 11/26/2012    Past Medical History:  Diagnosis Date   Anxiety    Takes xanax twice daily   ASCUS with positive high risk HPV cervical 09/15/2021   08/2021 +HPV 16/0ther ASCUS, needs colpo, immediate risk CIN3+ 9%   Asthma    Chronic headaches    "multiple times a day; they say it's related to thyroid"   Complication of anesthesia    Dysrhythmia    tachycardia   Fibromyalgia    Grave's disease    H/O seasonal allergies    Headache(784.0)    most days   Pneumonia 11/02/2011   "years ago"   PONV (postoperative nausea and vomiting)    Systemic lupus erythematosus (HCC)    Thyroid disease     Family History  Problem Relation Age of Onset   Hypertension Mother    Hyperlipidemia Mother    Diabetes Mother    Sleep apnea Mother    Hypertension Father     Hyperlipidemia Father    Hypothyroidism Father    Healthy Sister    Anesthesia problems Neg Hx    Hypotension Neg Hx    Malignant hyperthermia Neg Hx    Pseudochol deficiency Neg Hx    Past Surgical History:  Procedure Laterality Date   teeth pulled     back teeth   THYROIDECTOMY  11/02/2011   Procedure: THYROIDECTOMY;  Surgeon: Serena Colonel, MD;  Location: [facility] OR;  Service: ENT;  Laterality: Bilateral;  TOTAL THYROIDECTOMY   TOTAL THYROIDECTOMY  11/02/2011   WISDOM TOOTH EXTRACTION  06/27/2002   Social History   Social History Narrative   Not on file   Immunization History  Administered Date(s) Administered   DTaP 08/24/1984, 11/05/1984, 12/31/1984, 12/24/1985   Hepatitis B 04/26/1996, 05/31/1996, 08/30/1996   IPV 08/24/1984, 11/05/1984, 12/24/1985   Influenza,inj,Quad PF,6+ Mos 05/25/2020, 05/27/2021, 03/24/2022   Influenza-Unspecified 06/06/2011, 04/25/2012   MMR 12/24/1985   PPD Test 03/16/2015, 03/23/2015   Pneumococcal Polysaccharide-23 11/03/2011   Tdap 12/10/2010, 07/28/2021     Objective: Vital Signs: BP 103/71 (BP Location: Left Arm, Patient Position: Sitting, Cuff Size: Normal)   Pulse 73   Resp 14   Ht 5\' 7"  (1.702 m)   Wt 185 lb (83.9 kg)   BMI 28.98 kg/m    Physical Exam Eyes:     Conjunctiva/sclera: Conjunctivae normal.  Cardiovascular:     Rate and Rhythm: Normal rate and regular rhythm.  Pulmonary:     Effort: Pulmonary effort is normal.     Breath sounds: Normal breath sounds.  Lymphadenopathy:     Cervical: No cervical adenopathy.  Skin:    General: Skin is warm and dry.     Findings: Rash present.     Comments: Trace pitting edema in bilateral ankles Hyperpigmented skin rashes on forearms and hands extensor surfaces, less on torso and legs  Neurological:     Mental Status: She is alert.  Psychiatric:        Mood and Affect: Mood normal.      Musculoskeletal Exam:  Neck full ROM no tenderness Shoulder soreness to pressure and with  active overhead abduction, some tenderness extending around the scapula as well Elbows full ROM no tenderness or swelling Wrists full ROM no tenderness or swelling Fingers full ROM no tenderness or swelling Knees full ROM no tenderness or swelling  Investigation: No additional findings.  Imaging: No results found.  Recent Labs: Lab Results  Component Value Date   WBC 9.8 07/27/2022   HGB 13.2 07/27/2022   PLT 311 07/27/2022   NA 139 10/26/2022   K 4.0 10/26/2022   CL 98 10/26/2022   CO2 21 10/26/2022   GLUCOSE 49 (L) 10/26/2022   BUN 13 10/26/2022   CREATININE 1.09 (H) 10/26/2022   BILITOT 0.6 10/26/2022   ALKPHOS 105 10/26/2022   AST 18 10/26/2022   ALT 9 10/26/2022   PROT 7.2 10/26/2022   ALBUMIN 4.9 10/26/2022   CALCIUM 7.6 (L) 10/26/2022   GFRAA 80 02/27/2020   QFTBGOLDPLUS NEGATIVE 12/08/2020    Speciality Comments: PLQ eye exam has not been done yet Benlysta started 01/28/21  Procedures:  No procedures performed Allergies: Patient has no known allergies.   Assessment / Plan:     Visit Diagnoses: Systemic lupus erythematosus, unspecified SLE type, unspecified organ involvement status (HCC)  Disease activity appears to be doing very well by exam today.  Will plan to recheck serum markers double-stranded DNA, sed rate, and serum complements for disease activity monitoring.  Noticing some response so far but may see additional benefit with more than 2 doses send plan is to continue with Saphnelo 300 mg IV monthly.  Fibromyalgia  Currently seems to be the biggest driver of her symptoms especially the widespread aches and profound fatigue.  I discussed I do not particularly manage naloxone as a treatment for this though can certainly be an option through some other pain management providers.  She wants to pursue this further and sees lack of more clinical progress going forwards can refer.  High risk medication use  Tolerating the Saphnelo with no particular issue  or infusion reaction.  Complete metabolic panel checked on May 1 showed a low glucose of 49 and hypocalcemia of 7.6 neither which is really new compared range of her previous values last year.  Raynaud's phenomenon without gangrene    Orders: No orders of the defined types were placed in this encounter.  No orders of the defined types were placed in this encounter.    Follow-Up Instructions: Return in about 3 months (around 02/04/2023) for SLE on saphnelo f/u 3mos.   Fuller Plan, MD  Note - This record has been created using [employer].  Chart creation errors have been sought, but may not always  have been located. Such creation errors do not reflect on  the standard of medical care.

## 2022-11-04 ENCOUNTER — Encounter: Payer: Self-pay | Admitting: Internal Medicine

## 2022-11-04 ENCOUNTER — Ambulatory Visit: Payer: Medicaid Other | Attending: Internal Medicine | Admitting: Internal Medicine

## 2022-11-04 ENCOUNTER — Other Ambulatory Visit (HOSPITAL_COMMUNITY): Payer: Self-pay

## 2022-11-04 VITALS — BP 103/71 | HR 73 | Resp 14 | Ht 67.0 in | Wt 185.0 lb

## 2022-11-04 DIAGNOSIS — M329 Systemic lupus erythematosus, unspecified: Secondary | ICD-10-CM | POA: Diagnosis not present

## 2022-11-04 DIAGNOSIS — M797 Fibromyalgia: Secondary | ICD-10-CM

## 2022-11-04 DIAGNOSIS — Z79899 Other long term (current) drug therapy: Secondary | ICD-10-CM

## 2022-11-04 DIAGNOSIS — I7300 Raynaud's syndrome without gangrene: Secondary | ICD-10-CM

## 2022-11-08 ENCOUNTER — Other Ambulatory Visit: Payer: Self-pay | Admitting: *Deleted

## 2022-11-08 ENCOUNTER — Telehealth: Payer: Self-pay | Admitting: Internal Medicine

## 2022-11-08 ENCOUNTER — Other Ambulatory Visit: Payer: Medicaid Other

## 2022-11-08 ENCOUNTER — Telehealth: Payer: Self-pay | Admitting: Pharmacist

## 2022-11-08 DIAGNOSIS — I7300 Raynaud's syndrome without gangrene: Secondary | ICD-10-CM

## 2022-11-08 DIAGNOSIS — R768 Other specified abnormal immunological findings in serum: Secondary | ICD-10-CM | POA: Diagnosis not present

## 2022-11-08 DIAGNOSIS — Z79899 Other long term (current) drug therapy: Secondary | ICD-10-CM | POA: Diagnosis not present

## 2022-11-08 DIAGNOSIS — M329 Systemic lupus erythematosus, unspecified: Secondary | ICD-10-CM | POA: Diagnosis not present

## 2022-11-08 DIAGNOSIS — R7989 Other specified abnormal findings of blood chemistry: Secondary | ICD-10-CM

## 2022-11-08 NOTE — Telephone Encounter (Signed)
I called patient, labs orders faxed.

## 2022-11-08 NOTE — Telephone Encounter (Signed)
Patient requested her labwork orders be sent to Dr. Louanne Skye at [facility].  Patient plans on going today.   [facility]  Fax #[phone]

## 2022-11-08 NOTE — Telephone Encounter (Signed)
Received fax from [facility] regarding SAPHNELO infusion that patient received on 11/08/22. Labs were not drawn.  Patient tolerated infusion without complications.  Changes/concerns since last visit: NONE  Next SAPHNELO infusion scheduled for 12/06/22   Chesley Mires, PharmD, MPH, BCPS, CPP Clinical Pharmacist (Rheumatology and Pulmonology)

## 2022-11-09 ENCOUNTER — Encounter: Payer: Self-pay | Admitting: Internal Medicine

## 2022-11-09 LAB — ANTI-DNA ANTIBODY, DOUBLE-STRANDED

## 2022-11-09 NOTE — Telephone Encounter (Signed)
Letter sent in MyChart, I called patient.

## 2022-11-10 LAB — C3 AND C4
Complement C3, Serum: 132 mg/dL (ref 82–167)
Complement C4, Serum: 17 mg/dL (ref 12–38)

## 2022-11-10 LAB — SEDIMENTATION RATE: Sed Rate: 4 mm/hr (ref 0–32)

## 2022-11-22 ENCOUNTER — Other Ambulatory Visit: Payer: Self-pay | Admitting: Family Medicine

## 2022-11-22 DIAGNOSIS — F411 Generalized anxiety disorder: Secondary | ICD-10-CM

## 2022-11-22 DIAGNOSIS — Z716 Tobacco abuse counseling: Secondary | ICD-10-CM

## 2022-11-22 DIAGNOSIS — F331 Major depressive disorder, recurrent, moderate: Secondary | ICD-10-CM

## 2022-11-22 DIAGNOSIS — E890 Postprocedural hypothyroidism: Secondary | ICD-10-CM

## 2022-11-25 ENCOUNTER — Ambulatory Visit (INDEPENDENT_AMBULATORY_CARE_PROVIDER_SITE_OTHER): Payer: Medicaid Other

## 2022-11-25 DIAGNOSIS — Z308 Encounter for other contraceptive management: Secondary | ICD-10-CM

## 2022-11-25 DIAGNOSIS — Z3042 Encounter for surveillance of injectable contraceptive: Secondary | ICD-10-CM

## 2022-11-25 LAB — PREGNANCY, URINE: Preg Test, Ur: NEGATIVE

## 2022-11-25 MED ORDER — LEVOTHYROXINE SODIUM 200 MCG PO TABS: 200.0000 ug | ORAL_TABLET | Freq: Every day | ORAL | 1 refills | Status: DC

## 2022-11-25 NOTE — Telephone Encounter (Signed)
NA/VM full needing to speak to pt, thyroid med is needing to be changed this is why this has not been addressed yet.

## 2022-11-25 NOTE — Addendum Note (Signed)
Addended by: Julious Payer D on: 11/25/2022 12:03 PM   Modules accepted: Orders

## 2022-11-25 NOTE — Progress Notes (Signed)
Patient is past date of injection and needs a pregnancy test. Results of pregnancy test were negative.  Medroxyprogesterone injection given to left upper outer quadrant.  Patient tolerated well.

## 2022-11-25 NOTE — Telephone Encounter (Signed)
Pt aware of labs of 10/26/22 and change in dose of Levothyroxine to 200 mcg. Sending this in today to [facility]. Order place for recheck in 2-3 months.

## 2022-11-28 ENCOUNTER — Encounter: Payer: Self-pay | Admitting: Internal Medicine

## 2022-11-28 NOTE — Telephone Encounter (Signed)
Contacted the patient and the patient states she also has leg swelling, joint swelling, cannot make a fist, the tops of her feet hurt, every joint hurts, and she has been having shortness of breath. Patient inquired if Prednisone could be sent in as well. Patient call back number is [phone]. Please advise.

## 2022-11-30 MED ORDER — PREDNISONE 5 MG PO TABS: ORAL_TABLET | ORAL | 0 refills | Status: AC

## 2022-11-30 MED ORDER — LIDOCAINE VISCOUS HCL 2 % MT SOLN: 5.0000 mL | Freq: Four times a day (QID) | OROMUCOSAL | 0 refills | Status: AC | PRN

## 2022-11-30 NOTE — Telephone Encounter (Signed)
Patient states she would like the medication sent to [facility] in [place].

## 2022-11-30 NOTE — Telephone Encounter (Signed)
I can send a short prednisone prescription for all over symptoms. I will also send a prescription for a mouth wash solution shecan use as needed for the painful sores.  If trying both of these treatments does not help we may need to have her come by to take a look.  Which pharmacy would she like these sent to?

## 2022-12-06 ENCOUNTER — Telehealth: Payer: Self-pay | Admitting: Pharmacist

## 2022-12-06 NOTE — Telephone Encounter (Signed)
Received fax from [facility] regarding SAPHNELO infusion that patient was unable to receive Saphnelo today because patient was poorly hydrated and they were unable to obtain IV access. Appt is r/s to 12/09/22 Labs were not drawn.  Chesley Mires, PharmD, MPH, BCPS, CPP Clinical Pharmacist (Rheumatology and Pulmonology)

## 2022-12-09 ENCOUNTER — Telehealth: Payer: Self-pay | Admitting: Pharmacist

## 2022-12-09 DIAGNOSIS — M329 Systemic lupus erythematosus, unspecified: Secondary | ICD-10-CM | POA: Diagnosis not present

## 2022-12-09 NOTE — Telephone Encounter (Signed)
Received fax from [facility] regarding SAPHNELO infusion that patient received on 12/09/22. Labs were not drawn.  Patient tolerated infusion without complications.  Changes/concerns since last visit: none  Next SAPHNELO infusion scheduled for 01/06/23  Chesley Mires, PharmD, MPH, BCPS, CPP Clinical Pharmacist (Rheumatology and Pulmonology)

## 2023-01-09 DIAGNOSIS — M329 Systemic lupus erythematosus, unspecified: Secondary | ICD-10-CM | POA: Diagnosis not present

## 2023-01-10 ENCOUNTER — Telehealth: Payer: Self-pay | Admitting: Pharmacist

## 2023-01-10 NOTE — Telephone Encounter (Signed)
Received fax from [facility] regarding SAPHNELO infusion that patient received on 01/09/23. Labs were not drawn.  Patient tolerated infusion without complications.  Changes/concerns since last visit: none  Next SAPHNELO infusion scheduled for 02/06/23  Chesley Mires, PharmD, MPH, BCPS, CPP Clinical Pharmacist (Rheumatology and Pulmonology)

## 2023-01-23 ENCOUNTER — Encounter: Payer: Self-pay | Admitting: Family Medicine

## 2023-01-23 ENCOUNTER — Other Ambulatory Visit (HOSPITAL_COMMUNITY): Payer: Self-pay

## 2023-01-23 ENCOUNTER — Other Ambulatory Visit: Payer: Self-pay

## 2023-01-23 ENCOUNTER — Telehealth (INDEPENDENT_AMBULATORY_CARE_PROVIDER_SITE_OTHER): Payer: Medicaid Other | Admitting: Family Medicine

## 2023-01-23 DIAGNOSIS — F411 Generalized anxiety disorder: Secondary | ICD-10-CM | POA: Diagnosis not present

## 2023-01-23 DIAGNOSIS — F331 Major depressive disorder, recurrent, moderate: Secondary | ICD-10-CM

## 2023-01-23 DIAGNOSIS — E782 Mixed hyperlipidemia: Secondary | ICD-10-CM | POA: Diagnosis not present

## 2023-01-23 DIAGNOSIS — B029 Zoster without complications: Secondary | ICD-10-CM | POA: Diagnosis not present

## 2023-01-23 DIAGNOSIS — E890 Postprocedural hypothyroidism: Secondary | ICD-10-CM

## 2023-01-23 MED ORDER — BREXPIPRAZOLE 2 MG PO TABS
2.0000 mg | ORAL_TABLET | Freq: Every day | ORAL | 1 refills | Status: DC
Start: 2023-01-23 — End: 2023-05-01
  Filled 2023-01-23: qty 90, 90d supply, fill #0

## 2023-01-23 MED ORDER — SERTRALINE HCL 100 MG PO TABS
200.0000 mg | ORAL_TABLET | Freq: Every day | ORAL | 1 refills | Status: DC
Start: 2023-01-23 — End: 2023-05-01
  Filled 2023-01-23: qty 180, 90d supply, fill #0

## 2023-01-23 MED ORDER — LEVOTHYROXINE SODIUM 200 MCG PO TABS
200.0000 ug | ORAL_TABLET | Freq: Every day | ORAL | 1 refills | Status: DC
Start: 2023-01-23 — End: 2023-02-14
  Filled 2023-01-23: qty 90, 90d supply, fill #0

## 2023-01-23 MED ORDER — ATORVASTATIN CALCIUM 80 MG PO TABS
80.0000 mg | ORAL_TABLET | Freq: Every day | ORAL | 1 refills | Status: DC
Start: 2023-01-23 — End: 2023-05-01
  Filled 2023-01-23: qty 90, 90d supply, fill #0

## 2023-01-23 NOTE — Progress Notes (Signed)
Virtual Visit via MyChart video note  I connected with Valerie Ayala on 01/23/23 at 1309 by video and verified that I am speaking with the correct person using two identifiers. Valerie Ayala is currently located at home and patient are currently with her during visit. The provider, Elige Radon Draven Natter, MD is located in their office at time of visit.  Call ended at 1323  I discussed the limitations, risks, security and privacy concerns of performing an evaluation and management service by video and the availability of in person appointments. I also discussed with the patient that there may be a patient responsible charge related to this service. The patient expressed understanding and agreed to proceed.   History and Present Illness: Rash on arm and pain She had blisters and rash on her arm that looked like shingles on left arm.  She had pain and itching and burning on her arm.  She says they dried up now but were pretty bad, started about 1 week.   Hyperlipidemia Patient is coming in for recheck of his hyperlipidemia. The patient is currently taking atorvastin. They deny any issues with myalgias or history of liver damage from it. They deny any focal numbness or weakness or chest pain.   Hypothyroidism recheck Patient is coming in for thyroid recheck today as well. They deny any issues with hair changes or heat or cold problems or diarrhea or constipation. They deny any chest pain or palpitations. They are currently on levothyroxine   Anxiety and depression Patient is taking zoloft and rexulti and is taking both.  She had increased the zoloft to 200mg .  It got worse after she ran over her dog but is doing better now. Denies suicidal ideations.      Outpatient Encounter Medications as of 01/23/2023  Medication Sig   albuterol (PROVENTIL HFA;VENTOLIN HFA) 108 (90 BASE) MCG/ACT inhaler Inhale 2 puffs into the lungs every 6 (six) hours as needed. For shortness of breath    Anifrolumab-fnia (SAPHNELO IV) Inject into the vein every 30 (thirty) days.   atorvastatin (LIPITOR) 80 MG tablet Take 1 tablet (80 mg total) by mouth daily.   brexpiprazole (REXULTI) 2 MG TABS tablet Take 1 tablet (2 mg total) by mouth daily.   cholecalciferol (VITAMIN D) 1000 UNITS tablet Take 1,000 Units by mouth daily.    levothyroxine (SYNTHROID) 200 MCG tablet Take 1 tablet (200 mcg total) by mouth daily.   magic mouthwash (lidocaine, diphenhydrAMINE, alum & mag hydroxide) suspension Swish and spit 5 mLs 4 (four) times daily as needed for mouth pain.   medroxyPROGESTERone (DEPO-PROVERA) 150 MG/ML injection Inject 1 mL (150 mg total) into the muscle every 3 (three) months.   sertraline (ZOLOFT) 100 MG tablet Take 2 tablets (200 mg total) by mouth daily.   varenicline (CHANTIX) 1 MG tablet Take 1 tablet (1 mg total) by mouth 2 (two) times daily.   [DISCONTINUED] atorvastatin (LIPITOR) 80 MG tablet Take 1 tablet (80 mg total) by mouth daily.   [DISCONTINUED] brexpiprazole (REXULTI) 2 MG TABS tablet Take 1 tablet (2 mg total) by mouth daily.   [DISCONTINUED] levothyroxine (SYNTHROID) 200 MCG tablet Take 1 tablet (200 mcg total) by mouth daily.   [DISCONTINUED] sertraline (ZOLOFT) 100 MG tablet Take 1 & 1/2 tablets (150 mg total) by mouth daily.   No facility-administered encounter medications on file as of 01/23/2023.    Review of Systems  Constitutional:  Negative for chills and fever.  Eyes:  Negative for redness and  visual disturbance.  Respiratory:  Negative for chest tightness and shortness of breath.   Cardiovascular:  Negative for chest pain and leg swelling.  Skin:  Negative for rash.  Neurological:  Negative for dizziness, light-headedness and headaches.  Psychiatric/Behavioral:  Positive for dysphoric mood. Negative for agitation, behavioral problems, self-injury, sleep disturbance and suicidal ideas. The patient is nervous/anxious.   All other systems reviewed and are  negative.   Observations/Objective: Patient sounds comfortable and in no acute distres  Assessment and Plan: Problem List Items Addressed This Visit       Endocrine   Hypothyroidism   Relevant Medications   levothyroxine (SYNTHROID) 200 MCG tablet   Other Relevant Orders   CMP14+EGFR   TSH     Other   GAD (generalized anxiety disorder) - Primary   Relevant Medications   brexpiprazole (REXULTI) 2 MG TABS tablet   sertraline (ZOLOFT) 100 MG tablet   Other Relevant Orders   CBC with Differential/Platelet   CMP14+EGFR   Major depression, recurrent (HCC)   Relevant Medications   brexpiprazole (REXULTI) 2 MG TABS tablet   sertraline (ZOLOFT) 100 MG tablet   Hyperlipidemia   Relevant Medications   atorvastatin (LIPITOR) 80 MG tablet   Other Relevant Orders   CMP14+EGFR   Lipid panel   Other Visit Diagnoses     Herpes zoster without complication           Patient sounds to be doing well, will keep the increase Zoloft that she did about a month and a half ago.  It seems to be doing okay for her and she has not had side effects.  She will come in and do some blood work to check thyroid and cholesterol. Follow up plan: Return in about 3 months (around 04/25/2023), or if symptoms worsen or fail to improve, for hld and hypothyroid.     I discussed the assessment and treatment plan with the patient. The patient was provided an opportunity to ask questions and all were answered. The patient agreed with the plan and demonstrated an understanding of the instructions.   The patient was advised to call back or seek an in-person evaluation if the symptoms worsen or if the condition fails to improve as anticipated.  The above assessment and management plan was discussed with the patient. The patient verbalized understanding of and has agreed to the management plan. Patient is aware to call the clinic if symptoms persist or worsen. Patient is aware when to return to the clinic for a  follow-up visit. Patient educated on when it is appropriate to go to the emergency department.    I provided 14 minutes of non-face-to-face time during this encounter.    Nils Pyle, MD

## 2023-01-26 NOTE — Progress Notes (Signed)
Office Visit Note  Patient: Valerie Ayala             Date of Birth: [birth year]           MRN: [id number]             PCP: Dettinger, Elige Radon, MD Referring: Dettinger, Elige Radon, MD Visit Date: 02/02/2023   Subjective:  Follow-up (No improvement)   History of Present Illness: Valerie Ayala is a 37 y.o. female here for follow up for systemic lupus currently on treatment with Saphnelo infusions monthly first dose on March 6 second infusion slightly delayed to April 16 for perioperative management.  Overall she feels better than earlier in this year but she thinks there is less benefit after the more recent Saphnelo infusions compared to after her first 2 drug treatments.  She had 1 episode of extremely painful oral ulcers since the last visit which were a new symptom for her.  Leg swelling has been intermittent not too severe and without associated skin changes.  Worse problems currently just generalized fatigue and bodyaches with limited exertion tolerance.  Previous HPI 11/04/2022 Valerie Ayala is a 37 y.o. female here for follow up for systemic lupus currently on treatment with Saphnelo infusions monthly first dose on March 6 second infusion slightly delayed to April 16 for perioperative management.  She reports noticing a definite change after infusion with improvement in her skin rashes swelling though she remains very fatigued.  Reports some waning of the benefit she noticed during the last week or 2 prior to repeat infusion.  But currently doing still doing pretty well from treatment on the 16th of last month.  She has questions about naloxone that she read read about regarding management of her fibromyalgia due to persistent body aches and fatigue.   Previous HPI 08/05/22 Valerie Ayala is a 37 y.o. female here for follow up for systemic lupus currently off treatment due to worsening in LFTs and lack of symptom response. She continues to have joint pain and swelling in hands and feet and skin  rashes.  Raynaud's symptoms are worse during wintertime so far but not having any blistering or skin peeling changes.  Not much swelling back in her legs and no new mouth or nose ulcers.  Very severe fatigue is her most problematic symptom feels like she can sleep for 12 hours or more without having improvement in energy she has been referred for sleep study.   Previous HPI 06/02/22  Valerie Ayala is a 37 y.o. female here for follow up for SLE after last visit discontinued benlysta due to loss of efficacy and starting methotrexate 15 mg PO weekly and folic acid 1 mg daily and continuing HCQ 300 mg daily. Also 2 week prednisone taper for general exacerbation of symptoms at that time likely triggered by preceding URI. Since then she continues to feel considerable joint pain and stiffness. Most affected in her bilateral shoulders and knees but also with hand pain limiting some activities such as coloring. Symptoms are worst overnight and for a few hours each morning. She had at least one episode of bilateral leg swelling lasting several days.    Previous HPI 03/22/22 Valerie Ayala is a 37 y.o. female here for follow up for SLE on HCQ 300 mg daily and benlysta 200 mg [place] weekly. She is currently feeling worse pretty much all over. Especially joint pains and fatigue and increased skin rashes. She had an upper respiratory illness  recently she recovered since about a week ago but has other symptoms are all worse since then. Leg swelling is doing okay and no new mouth and nose ulcers.   Previous HPI 10/14/2021 Valerie Ayala is a 37 y.o. female here for follow up for SLE on HCQ 300 mg daily and Benlysta 200 mg [place] weekly. At last visit significant worsening in lower extremity swelling but normal lab markers and negative for proteinuria.  Since the last visit he continues to do very poorly.  She has ongoing leg swelling itching sometimes there is redness discoloration.  Newer problem is feeling some proximal leg weakness  occasional sense of her legs giving out or locking up when walking.  She is extremely fatigued almost every day.  Sometimes she cannot even get out of bed for hours and her mood is very poor due to decreased ability to do anything.  She is also been noticing some palpitations and lightheadedness getting short of breath with very brief or mild exertion.     Previous HPI 06/15/21 Valerie Ayala is a 37 y.o. female here for follow up for SLE on HCQ 300 mg daily and Benlysta 200 mg [place] weekly.  She was doing very well after our last visit until about a month ago now feels everything is horrible.  She has significant bilateral leg swelling with pitting edema developing increased tenderness and painful burning sensation with discolorations and skin changes in both legs.  She feels very fatigued.  Some skin dryness and peeling on the hands but no significant erythematous rash outside of the lower legs.  She recently also had flu with generalized body pains prescribed very large anterior cervical adenopathy.  This improved though she still has a residual ulcer in the mouth. Labs earlier this month showing very low TSH level with recent slight downward adjustment of her thyroid replacement.  She quit smoking with use of Chantix.   Previous HPI 12/08/20 Valerie Ayala is a 37 y.o. female here for follow up for systemic lupus on hydroxychloroquine 300 mg p.o. daily. No signfiicant improvement since starting hydroxychloroquine 8 weeks ago.  She has not yet seen ophthalmology for retinal exam but plans to do so. Leg swelling continues to come and go, residual erythema and pain and burning sensations.  She continues to feel extremely fatigued as the most noticeable symptom.  She is also had a few mouth sores headaches and swollen glands but also describes the ongoing leg swelling sometimes with pain and discoloration.  Currently her left distal leg is most affected today.   Lupus  manifestations Fatigue Arthralgias Photosensitive rash Oral ulcers Raynaud's   Lupus serology ANA 1:80 speckled dsDNA 49 Complement C3, C4 wnl Vit D 19   Review of Systems  Constitutional:  Positive for fatigue.  HENT:  Positive for mouth sores and mouth dryness.   Eyes:  Positive for dryness.  Respiratory:  Positive for shortness of breath.   Cardiovascular:  Positive for palpitations. Negative for chest pain.  Gastrointestinal:  Positive for constipation. Negative for blood in stool and diarrhea.  Endocrine: Positive for increased urination.  Genitourinary:  Negative for involuntary urination.  Musculoskeletal:  Positive for joint pain, joint pain, joint swelling, myalgias, muscle weakness, morning stiffness, muscle tenderness and myalgias. Negative for gait problem.  Skin:  Positive for color change, hair loss and sensitivity to sunlight. Negative for rash.  Allergic/Immunologic: Positive for susceptible to infections.  Neurological:  Positive for dizziness and headaches.  Hematological:  Positive  for swollen glands.  Psychiatric/Behavioral:  Positive for depressed mood. Negative for sleep disturbance. The patient is nervous/anxious.     PMFS History:  Patient Active Problem List   Diagnosis Date Noted   Fibromyalgia 08/08/2022   ASCUS with positive high risk HPV cervical 09/15/2021   History of abnormal cervical Pap smear 09/08/2021   Swelling of lower extremity 12/08/2020   High risk medication use 12/08/2020   Hyperlipidemia 12/03/2020   Hypocalcemia 09/07/2020   Vitamin D deficiency 09/07/2020   Systemic lupus (HCC) 08/21/2020   Dry mouth 08/21/2020   Tobacco use disorder 08/21/2020   Arthralgia 08/21/2020   Raynaud's phenomenon 08/21/2020   Major depression, recurrent (HCC) 04/26/2018   Adult ADHD 12/16/2014   Asthma 05/07/2013   Hypothyroidism 11/26/2012   GAD (generalized anxiety disorder) 11/26/2012    Past Medical History:  Diagnosis Date   Anxiety     Takes xanax twice daily   ASCUS with positive high risk HPV cervical 09/15/2021   08/2021 +HPV 16/0ther ASCUS, needs colpo, immediate risk CIN3+ 9%   Asthma    Chronic headaches    "multiple times a day; they say it's related to thyroid"   Complication of anesthesia    Dysrhythmia    tachycardia   Fibromyalgia    Grave's disease    H/O seasonal allergies    Headache(784.0)    most days   Pneumonia 11/02/2011   "years ago"   PONV (postoperative nausea and vomiting)    Systemic lupus erythematosus (HCC)    Thyroid disease     Family History  Problem Relation Age of Onset   Hypertension Mother    Hyperlipidemia Mother    Diabetes Mother    Sleep apnea Mother    Hypertension Father    Hyperlipidemia Father    Hypothyroidism Father    Healthy Sister    Anesthesia problems Neg Hx    Hypotension Neg Hx    Malignant hyperthermia Neg Hx    Pseudochol deficiency Neg Hx    Past Surgical History:  Procedure Laterality Date   teeth pulled     back teeth   THYROIDECTOMY  11/02/2011   Procedure: THYROIDECTOMY;  Surgeon: Serena Colonel, MD;  Location: [facility] OR;  Service: ENT;  Laterality: Bilateral;  TOTAL THYROIDECTOMY   TOTAL THYROIDECTOMY  11/02/2011   WISDOM TOOTH EXTRACTION  06/27/2002   Social History   Social History Narrative   Not on file   Immunization History  Administered Date(s) Administered   DTaP 08/24/1984, 11/05/1984, 12/31/1984, 12/24/1985   Hepatitis B 04/26/1996, 05/31/1996, 08/30/1996   IPV 08/24/1984, 11/05/1984, 12/24/1985   Influenza,inj,Quad PF,6+ Mos 05/25/2020, 05/27/2021, 03/24/2022   Influenza-Unspecified 06/06/2011, 04/25/2012   MMR 12/24/1985   PPD Test 03/16/2015, 03/23/2015   Pneumococcal Polysaccharide-23 11/03/2011   Tdap 12/10/2010, 07/28/2021     Objective: Vital Signs: BP 121/83 (BP Location: Left Arm, Patient Position: Sitting, Cuff Size: Normal)   Pulse (!) 128   Resp 14   Ht 5\' 7"  (1.702 m)   Wt 181 lb (82.1 kg)   BMI 28.35  kg/m    Physical Exam Eyes:     Conjunctiva/sclera: Conjunctivae normal.  Cardiovascular:     Rate and Rhythm: Regular rhythm. Tachycardia present.  Pulmonary:     Effort: Pulmonary effort is normal.     Breath sounds: Normal breath sounds.  Lymphadenopathy:     Cervical: No cervical adenopathy.  Skin:    General: Skin is warm and dry.     Findings: Rash present.  Comments: Flat hyperpigmented patches on hand and forearm extensor surfaces bilaterally Trace pedal edema bilateral  Neurological:     Mental Status: She is alert.  Psychiatric:        Mood and Affect: Mood normal.      Musculoskeletal Exam:  Shoulder soreness to pressure, no swelling, pain reaching overhead but normal ROM Elbows full ROM no tenderness or swelling Wrists full ROM no tenderness or swelling Fingers full ROM no tenderness or swelling Bilateral hip tenderness to pressure on lateral sides, no radiation Knees full ROM no tenderness or swelling   Investigation: No additional findings.  Imaging: No results found.  Recent Labs: Lab Results  Component Value Date   WBC 8.2 01/30/2023   HGB 12.0 01/30/2023   PLT 334 01/30/2023   NA 137 01/30/2023   K 3.9 01/30/2023   CL 99 01/30/2023   CO2 22 01/30/2023   GLUCOSE 107 (H) 01/30/2023   BUN 17 01/30/2023   CREATININE 0.75 01/30/2023   BILITOT 0.2 01/30/2023   ALKPHOS 76 01/30/2023   AST 12 01/30/2023   ALT 7 01/30/2023   PROT 6.5 01/30/2023   ALBUMIN 4.4 01/30/2023   CALCIUM 8.2 (L) 01/30/2023   GFRAA 80 02/27/2020   QFTBGOLDPLUS NEGATIVE 12/08/2020    Speciality Comments: PLQ eye exam has not been done yet Benlysta started 01/28/21  Procedures:  No procedures performed Allergies: Patient has no known allergies.   Assessment / Plan:     Visit Diagnoses: Systemic lupus erythematosus, unspecified SLE type, unspecified organ involvement status (HCC) - Plan: Anti-DNA antibody, double-stranded, C3 and C4, Sedimentation rate  Overall  feels her functional status is not great limited by fatigue and bodyaches but objective symptoms are doing well.  No new progression of skin involvement and leg swelling is minimal.  Oral ulcers were new but only 1 episode.  Will recheck sedimentation rate double-stranded ENA and serum complements for monitoring of disease activity.  Will put order to get these as upcoming lab on the 12th anticipated.  Plan to continue Saphnelo 300 mg IV monthly.  High risk medication use - Saphnelo 300 mg IV monthly  Tolerating infusions fine without any particular side effect.  Recent labs from August 5 with normal blood count unremarkable cholesterol her complete metabolic panel did show some mild hypocalcemia at 8.2 which has been a chronic issue for her as well as the low vitamin D.  Fibromyalgia  Current symptom complaints look more consistent for fibromyalgia syndrome activity versus lupus flare.  She had questions about naltrexone therapy for this.  I do not have personal experience with it although several patients and providers have had success for FMS symptom management.  I think she would benefit seeing specialist with familiarity will refer to pain management.  Raynaud's phenomenon without gangrene  Symptoms remain minimal at this time not a lot of cold exposure provocation.  Still smoking reviewed again pro inflammatory effect of this on autoimmune disease in general and circulation in particular.  Orders: Orders Placed This Encounter  Procedures   Anti-DNA antibody, double-stranded   C3 and C4   Sedimentation rate   No orders of the defined types were placed in this encounter.    Follow-Up Instructions: Return in about 3 months (around 05/05/2023) for SLE on saphnelo f/u 3mos.   Fuller Plan, MD  Note - This record has been created using [employer].  Chart creation errors have been sought, but may not always  have been located. Such creation errors  do not reflect on  the  standard of medical care.

## 2023-01-27 ENCOUNTER — Ambulatory Visit: Payer: Medicaid Other | Admitting: Family Medicine

## 2023-01-30 ENCOUNTER — Other Ambulatory Visit: Payer: Medicaid Other

## 2023-01-30 DIAGNOSIS — E890 Postprocedural hypothyroidism: Secondary | ICD-10-CM | POA: Diagnosis not present

## 2023-01-30 DIAGNOSIS — E782 Mixed hyperlipidemia: Secondary | ICD-10-CM | POA: Diagnosis not present

## 2023-01-30 DIAGNOSIS — F411 Generalized anxiety disorder: Secondary | ICD-10-CM | POA: Diagnosis not present

## 2023-01-30 LAB — CBC WITH DIFFERENTIAL/PLATELET
Basophils Absolute: 0.1 10*3/uL (ref 0.0–0.2)
Basos: 1 %
EOS (ABSOLUTE): 0.3 10*3/uL (ref 0.0–0.4)
Eos: 3 %
Hematocrit: 35.2 % (ref 34.0–46.6)
Hemoglobin: 12 g/dL (ref 11.1–15.9)
Immature Grans (Abs): 0 10*3/uL (ref 0.0–0.1)
Immature Granulocytes: 0 %
Lymphocytes Absolute: 3.1 10*3/uL (ref 0.7–3.1)
Lymphs: 38 %
MCH: 29.6 pg (ref 26.6–33.0)
MCHC: 34.1 g/dL (ref 31.5–35.7)
MCV: 87 fL (ref 79–97)
Monocytes Absolute: 0.5 10*3/uL (ref 0.1–0.9)
Monocytes: 7 %
Neutrophils Absolute: 4.2 10*3/uL (ref 1.4–7.0)
Neutrophils: 51 %
Platelets: 334 10*3/uL (ref 150–450)
RBC: 4.05 x10E6/uL (ref 3.77–5.28)
RDW: 13 % (ref 11.7–15.4)
WBC: 8.2 10*3/uL (ref 3.4–10.8)

## 2023-01-30 LAB — LIPID PANEL: Triglycerides: 154 mg/dL — ABNORMAL HIGH (ref 0–149)

## 2023-01-30 LAB — CMP14+EGFR
ALT: 7 IU/L (ref 0–32)
AST: 12 IU/L (ref 0–40)
Albumin: 4.4 g/dL (ref 3.9–4.9)
Alkaline Phosphatase: 76 IU/L (ref 44–121)
BUN/Creatinine Ratio: 23 (ref 9–23)
BUN: 17 mg/dL (ref 6–20)
Bilirubin Total: 0.2 mg/dL (ref 0.0–1.2)
CO2: 22 mmol/L (ref 20–29)
Calcium: 8.2 mg/dL — ABNORMAL LOW (ref 8.7–10.2)
Chloride: 99 mmol/L (ref 96–106)
Creatinine, Ser: 0.75 mg/dL (ref 0.57–1.00)
Globulin, Total: 2.1 g/dL (ref 1.5–4.5)
Glucose: 107 mg/dL — ABNORMAL HIGH (ref 70–99)
Potassium: 3.9 mmol/L (ref 3.5–5.2)
Sodium: 137 mmol/L (ref 134–144)
Total Protein: 6.5 g/dL (ref 6.0–8.5)
eGFR: 104 mL/min/{1.73_m2} (ref >59)

## 2023-01-30 LAB — TSH

## 2023-02-01 ENCOUNTER — Other Ambulatory Visit (HOSPITAL_COMMUNITY): Payer: Self-pay

## 2023-02-02 ENCOUNTER — Ambulatory Visit: Payer: Medicaid Other | Attending: Internal Medicine | Admitting: Internal Medicine

## 2023-02-02 ENCOUNTER — Encounter: Payer: Self-pay | Admitting: Internal Medicine

## 2023-02-02 VITALS — BP 121/83 | HR 128 | Resp 14 | Ht 67.0 in | Wt 181.0 lb

## 2023-02-02 DIAGNOSIS — M329 Systemic lupus erythematosus, unspecified: Secondary | ICD-10-CM | POA: Diagnosis not present

## 2023-02-02 DIAGNOSIS — I7300 Raynaud's syndrome without gangrene: Secondary | ICD-10-CM | POA: Diagnosis not present

## 2023-02-02 DIAGNOSIS — Z79899 Other long term (current) drug therapy: Secondary | ICD-10-CM

## 2023-02-02 DIAGNOSIS — M797 Fibromyalgia: Secondary | ICD-10-CM | POA: Diagnosis not present

## 2023-02-09 ENCOUNTER — Telehealth: Payer: Self-pay | Admitting: Pharmacist

## 2023-02-09 NOTE — Telephone Encounter (Signed)
Received fax from [facility] stating that patient was no-call, no-show to Saphnelo infusion appt that was on 02/08/2023. Called patient to follow-up - unable to reach and unable to leave VM because VM box is full  Next appointment is scheduled for 03/08/2023 @ 2pm  Chesley Mires, PharmD, MPH, BCPS, CPP Clinical Pharmacist (Rheumatology and Pulmonology)

## 2023-02-13 ENCOUNTER — Other Ambulatory Visit: Payer: Self-pay | Admitting: *Deleted

## 2023-02-13 DIAGNOSIS — M797 Fibromyalgia: Secondary | ICD-10-CM

## 2023-02-13 NOTE — Progress Notes (Signed)
Referral placed.

## 2023-02-14 ENCOUNTER — Other Ambulatory Visit: Payer: Self-pay | Admitting: Family Medicine

## 2023-02-14 ENCOUNTER — Telehealth: Payer: Self-pay | Admitting: Pharmacist

## 2023-02-14 DIAGNOSIS — Z3009 Encounter for other general counseling and advice on contraception: Secondary | ICD-10-CM

## 2023-02-14 DIAGNOSIS — E890 Postprocedural hypothyroidism: Secondary | ICD-10-CM

## 2023-02-14 NOTE — Telephone Encounter (Signed)
Received fax from [facility] stating patient was again no-call/no-show to Saphnelo infusion on 02/14/2023  Next appt has not been scheduled  Chesley Mires, PharmD, MPH, BCPS, CPP Clinical Pharmacist (Rheumatology and Pulmonology)

## 2023-02-16 ENCOUNTER — Telehealth: Payer: Self-pay | Admitting: *Deleted

## 2023-02-16 NOTE — Telephone Encounter (Signed)
[facility] contacted the office to advise that patient is being no compliant with her treatment. Patient had an appointment scheduled for February 06, 2023. She cancelled that appointment because she forgot about it. Patient the rescheduled for both August 14th and 20th, she no called no showed those appointments. Advised by [facility] patient cancels or no shows every other appointment. They would like to know if patient should continue her treatment with them.

## 2023-02-16 NOTE — Telephone Encounter (Signed)
ATC patient - patient picked up phone and hung up on first attempt. ATC patient again but unable to reach. VM Box is full.  Called [facility] to request her referral remain in active until we can contact patient. MyChart message sent to pt  Chesley Mires, PharmD, MPH, BCPS, CPP Clinical Pharmacist (Rheumatology and Pulmonology)

## 2023-02-17 ENCOUNTER — Ambulatory Visit (INDEPENDENT_AMBULATORY_CARE_PROVIDER_SITE_OTHER): Payer: Medicaid Other

## 2023-02-17 DIAGNOSIS — Z3042 Encounter for surveillance of injectable contraceptive: Secondary | ICD-10-CM

## 2023-02-17 DIAGNOSIS — Z308 Encounter for other contraceptive management: Secondary | ICD-10-CM

## 2023-02-17 MED ORDER — MEDROXYPROGESTERONE ACETATE 150 MG/ML IM SUSY
150.0000 mg | PREFILLED_SYRINGE | INTRAMUSCULAR | Status: AC
Start: 2023-02-17 — End: 2023-02-17
  Administered 2023-02-17: 150 mg via INTRAMUSCULAR

## 2023-02-17 MED ORDER — MEDROXYPROGESTERONE ACETATE 104 MG/0.65ML ~~LOC~~ SUSY
104.0000 mg | PREFILLED_SYRINGE | Freq: Once | SUBCUTANEOUS | Status: AC
Start: 2023-02-17 — End: ?

## 2023-02-17 NOTE — Progress Notes (Signed)
Medroxyprogesterone given in right upper quad and tolerated well.

## 2023-03-03 ENCOUNTER — Telehealth: Payer: Self-pay | Admitting: Pharmacist

## 2023-03-03 DIAGNOSIS — M329 Systemic lupus erythematosus, unspecified: Secondary | ICD-10-CM | POA: Diagnosis not present

## 2023-03-03 NOTE — Telephone Encounter (Signed)
Received fax from [facility] regarding Saphnelo IV [phone]) infusion that patient received on 03/03/23. Dose: 300mg  Labs were not drawn.  Patient tolerated infusion without complications.  Changes/concerns since last visit: none  Next SAPHNELO infusion scheduled for 04/05/2023   Chesley Mires, PharmD, MPH, BCPS, CPP Clinical Pharmacist (Rheumatology and Pulmonology)

## 2023-03-14 ENCOUNTER — Telehealth: Payer: Self-pay | Admitting: Internal Medicine

## 2023-03-14 ENCOUNTER — Other Ambulatory Visit: Payer: Medicaid Other

## 2023-03-14 ENCOUNTER — Encounter: Payer: Self-pay | Admitting: Physical Medicine & Rehabilitation

## 2023-03-14 DIAGNOSIS — Z79899 Other long term (current) drug therapy: Secondary | ICD-10-CM | POA: Diagnosis not present

## 2023-03-14 DIAGNOSIS — R768 Other specified abnormal immunological findings in serum: Secondary | ICD-10-CM | POA: Diagnosis not present

## 2023-03-14 DIAGNOSIS — M329 Systemic lupus erythematosus, unspecified: Secondary | ICD-10-CM

## 2023-03-14 NOTE — Telephone Encounter (Signed)
Patient contacted the office requesting lab orders to be be release to [facility].   Patient plans to have labs on today 03/14/23 PT is there now

## 2023-03-14 NOTE — Telephone Encounter (Signed)
Lab Orders released to [facility].

## 2023-03-16 LAB — C3 AND C4
Complement C3, Serum: 129 mg/dL (ref 82–167)
Complement C4, Serum: 17 mg/dL (ref 12–38)

## 2023-03-16 LAB — ANTI-DNA ANTIBODY, DOUBLE-STRANDED: dsDNA Ab: 22 IU/mL — ABNORMAL HIGH (ref 0–9)

## 2023-03-16 LAB — SEDIMENTATION RATE: Sed Rate: 3 mm/hr (ref 0–32)

## 2023-04-05 DIAGNOSIS — M329 Systemic lupus erythematosus, unspecified: Secondary | ICD-10-CM | POA: Diagnosis not present

## 2023-04-07 ENCOUNTER — Encounter: Payer: Self-pay | Admitting: Internal Medicine

## 2023-04-10 ENCOUNTER — Telehealth: Payer: Self-pay | Admitting: Pharmacist

## 2023-04-10 NOTE — Progress Notes (Signed)
dsDNA partially improved to 22 down from 48. Sedimentation rate and complement tests were normal. This looks like more improvement with saphnelo than we have seen on our previous treatments so far.

## 2023-04-10 NOTE — Telephone Encounter (Signed)
Received fax from [facility] regarding Saphnelo IV [phone])  infusion that patient received on 04/05/2023. Dose: 300mg  Labs were not drawn.  Patient tolerated infusion without complications.  Changes/concerns since last visit:  none  Next SAPHNELO infusion scheduled for 05/03/2023  Chesley Mires, PharmD, MPH, BCPS, CPP Clinical Pharmacist (Rheumatology and Pulmonology)

## 2023-04-10 NOTE — Telephone Encounter (Signed)
I think I just filled this out end of last week.

## 2023-04-13 ENCOUNTER — Other Ambulatory Visit (HOSPITAL_COMMUNITY): Payer: Self-pay

## 2023-04-13 ENCOUNTER — Encounter: Payer: Medicaid Other | Attending: Physical Medicine & Rehabilitation | Admitting: Physical Medicine & Rehabilitation

## 2023-04-13 ENCOUNTER — Encounter: Payer: Self-pay | Admitting: Physical Medicine & Rehabilitation

## 2023-04-13 VITALS — BP 121/84 | HR 103 | Ht 67.0 in | Wt 189.0 lb

## 2023-04-13 DIAGNOSIS — R29818 Other symptoms and signs involving the nervous system: Secondary | ICD-10-CM | POA: Diagnosis not present

## 2023-04-13 DIAGNOSIS — M797 Fibromyalgia: Secondary | ICD-10-CM | POA: Insufficient documentation

## 2023-04-13 DIAGNOSIS — F32A Depression, unspecified: Secondary | ICD-10-CM | POA: Insufficient documentation

## 2023-04-13 DIAGNOSIS — M329 Systemic lupus erythematosus, unspecified: Secondary | ICD-10-CM | POA: Diagnosis not present

## 2023-04-13 MED ORDER — PREGABALIN 75 MG PO CAPS: 75.0000 mg | ORAL_CAPSULE | Freq: Two times a day (BID) | ORAL | 3 refills | Status: DC

## 2023-04-13 MED ORDER — PREGABALIN 75 MG PO CAPS
75.0000 mg | ORAL_CAPSULE | Freq: Two times a day (BID) | ORAL | 3 refills | Status: DC
  Filled 2023-04-13: qty 60, 30d supply, fill #0

## 2023-04-13 NOTE — Progress Notes (Signed)
Subjective:    Patient ID: Valerie Ayala, female    DOB: 1985/09/03, 37 y.o.   MRN: [id number]  HPI  HPI 04/13/2019  Valerie Ayala is a 37 y.o. year old female  who  has a past medical history of Anxiety, ASCUS with positive high risk HPV cervical (09/15/2021), Asthma, Chronic headaches, Complication of anesthesia, Dysrhythmia, Fibromyalgia, Grave's disease, H/O seasonal allergies, Headache(784.0), Pneumonia (11/02/2011), PONV (postoperative nausea and vomiting), Systemic lupus erythematosus (HCC), and Thyroid disease.   They are presenting to PM&R clinic as a new patient for pain management evaluation. They were referred by Dr. Dimple Casey rheumatology for treatment of fibromyalgia pain.  Patient reports that she first developed blisters after sun exposure about 4 years ago.  She developed a lot of pain and numbness in her hands.  She was seen by rheumatology and diagnosed with systemic lupus erythematosus.  She was later also diagnosed with fibromyalgia and Raynaud's phenomenon without gangrene.  Patient reports she continues to have a lot of pain in her hands only often feel achy.  She also has severe pain in her legs primarily from her knees down to her feet.  Even her cats rubbing against her feet and legs will cause her pain.  She feels like her joints are stiff all day.  She often has bodyaches.  She has pain in her middle and lower back.  Neck is not particularly painful.  Her right shoulder will often give her pain with movement.  She says her elbows often feel very sensitive.  She reports she will have swelling in her legs intermittently.  She reports overall weight gain due to inactivity.  Patient reports she sleeps at night but continues to be tired and asleep during the day.  Patient reports her PCP suspects sleep apnea, she says she is working on getting evaluated for this.  Patient reports she is not very active and has poor activity tolerance. She reports history of depression but says it is  overall well-managed on current medications.  She reports she tried multiple other medications in the past and current regimen is what works the best.  Patient reports she has a history of misuse of opioid medications.  She would like to avoid this type of medication.  Patient says she was reading up on naltrexone treatment.  She was previously on Suboxone/Subutex treatment and she found this to be beneficial.  Red flag symptoms: No red flags for back pain endorsed in Hx or ROS  Medications tried: Topical medications- Denies  Nsaids - Ibuprofen helps a litle Tylenol -helps a litlle Opiates   She would like to avoid due to hx of opiod abuse  Gabapentin - Used for trigeminal neuralgia- resovled so she stopped taking  Lyrica - Denies  TCAs - denies  SNRIs - Cymbalta used in the past for mood, made her angrier     Other treatments: PT- Denies Chiropractor- didn't help   TENs unit - Helped a little  Injections - Denies  Surgery- denies   Prior UDS results: No results found for: "LABOPIA", "COCAINSCRNUR", "LABBENZ", "AMPHETMU", "THCU", "LABBARB"   Pain Inventory Average Pain 5 Pain Right Now 4 My pain is intermittent, constant, burning, stabbing, tingling, and aching  In the last 24 hours, has pain interfered with the following? General activity 4 Relation with others 4 Enjoyment of life 6 What TIME of day is your pain at its worst? morning , daytime, and evening Sleep (in general) Good  Pain is worse with:  bending, sitting, and some activites Pain improves with: rest, therapy/exercise, and medication Relief from Meds: 4  walk without assistance how many minutes can you walk? 30-45 mins  disabled: date disabled 2022  weakness numbness tingling spasms dizziness depression  Any changes since last visit?  no  Any changes since last visit?  no    Family History  Problem Relation Age of Onset   Hypertension Mother    Hyperlipidemia Mother    Diabetes Mother     Sleep apnea Mother    Hypertension Father    Hyperlipidemia Father    Hypothyroidism Father    Healthy Sister    Anesthesia problems Neg Hx    Hypotension Neg Hx    Malignant hyperthermia Neg Hx    Pseudochol deficiency Neg Hx    Social History   Socioeconomic History   Marital status: Married    Spouse name: Not on file   Number of children: Not on file   Years of education: Not on file   Highest education level: Not on file  Occupational History   Not on file  Tobacco Use   Smoking status: Some Days    Current packs/day: 0.00    Average packs/day: 0.5 packs/day for 12.0 years (6.0 ttl pk-yrs)    Types: Cigarettes    Start date: 04/27/2009    Last attempt to quit: 04/27/2021    Years since quitting: 1.9    Passive exposure: Current   Smokeless tobacco: Never  Vaping Use   Vaping status: Never Used  Substance and Sexual Activity   Alcohol use: Not Currently   Drug use: No   Sexual activity: Yes    Birth control/protection: Injection  Other Topics Concern   Not on file  Social History Narrative   Not on file   Social Determinants of Health   Financial Resource Strain: Low Risk  (09/08/2021)   Overall Financial Resource Strain (CARDIA)    Difficulty of Paying Living Expenses: Not hard at all  Food Insecurity: No Food Insecurity (09/08/2021)   Hunger Vital Sign    Worried About Running Out of Food in the Last Year: Never true    Ran Out of Food in the Last Year: Never true  Transportation Needs: No Transportation Needs (09/08/2021)   PRAPARE - Administrator, Civil Service (Medical): No    Lack of Transportation (Non-Medical): No  Physical Activity: Insufficiently Active (09/08/2021)   Exercise Vital Sign    Days of Exercise per Week: 1 day    Minutes of Exercise per Session: 40 min  Stress: No Stress Concern Present (09/08/2021)   [employer] of Occupational Health - Occupational Stress Questionnaire    Feeling of Stress : Not at all  Social  Connections: Moderately Isolated (09/08/2021)   Social Connection and Isolation Panel [NHANES]    Frequency of Communication with Friends and Family: Twice a week    Frequency of Social Gatherings with Friends and Family: Once a week    Attends Religious Services: Never    Database administrator or Organizations: No    Attends Banker Meetings: Never    Marital Status: Married   Past Surgical History:  Procedure Laterality Date   teeth pulled     back teeth   THYROIDECTOMY  11/02/2011   Procedure: THYROIDECTOMY;  Surgeon: Serena Colonel, MD;  Location: [facility] OR;  Service: ENT;  Laterality: Bilateral;  TOTAL THYROIDECTOMY   TOTAL THYROIDECTOMY  11/02/2011   WISDOM TOOTH EXTRACTION  06/27/2002   Past Medical History:  Diagnosis Date   Anxiety    Takes xanax twice daily   ASCUS with positive high risk HPV cervical 09/15/2021   08/2021 +HPV 16/0ther ASCUS, needs colpo, immediate risk CIN3+ 9%   Asthma    Chronic headaches    "multiple times a day; they say it's related to thyroid"   Complication of anesthesia    Dysrhythmia    tachycardia   Fibromyalgia    Grave's disease    H/O seasonal allergies    Headache(784.0)    most days   Pneumonia 11/02/2011   "years ago"   PONV (postoperative nausea and vomiting)    Systemic lupus erythematosus (HCC)    Thyroid disease    There were no vitals taken for this visit.  Opioid Risk Score:   Fall Risk Score:  `1  Depression screen [facility] 2/9     10/26/2022   11:22 AM 07/27/2022    1:45 PM 05/25/2022    1:18 PM 03/24/2022    9:07 AM 11/19/2021   10:15 AM 09/08/2021   11:00 AM 07/28/2021    1:25 PM  Depression screen PHQ 2/9  Decreased Interest 1 1 1 3 2 1 2   Down, Depressed, Hopeless 1 1 1 3 1 1 2   PHQ - 2 Score 2 2 2 6 3 2 4   Altered sleeping 3 3 3 3 3 3 3   Tired, decreased energy 3 3 3 3 3 3 3   Change in appetite 3 3 2 3 1 1 2   Feeling bad or failure about yourself  1 2 2 3 1 1 2   Trouble concentrating 1 2 2 2 1 1 1    Moving slowly or fidgety/restless 1 2 0 2 1 0 0  Suicidal thoughts 0 1 0 2 0 0 0  PHQ-9 Score 14 18 14 24 13 11 15   Difficult doing work/chores Not difficult at all Somewhat difficult Somewhat difficult Extremely dIfficult Very difficult      Review of Systems  Constitutional:        Weight gain  Respiratory:  Positive for apnea.   Gastrointestinal:  Positive for abdominal pain and constipation.  Musculoskeletal:        Spasms  Skin:  Positive for rash.  Neurological:  Positive for dizziness, weakness and numbness.       Tingling  Psychiatric/Behavioral:         Depression  All other systems reviewed and are negative.      Objective:   Physical Exam   Gen: no distress, normal appearing HEENT: oral mucosa pink and moist, NCAT Chest: normal effort, normal rate of breathing Abd: soft, non-distended Ext: no edema Psych: pleasant, normal affect Skin: intact Neuro: Alert and oriented, follows commands, normal speech and language, good insight and awareness, able to provide coherent history Grossly 4+ out of 5 on manual muscle testing throughout, appears to be giveaway weakness limited by pain/effort Sensory exam normal for light touch and pain in all 4 limbs.  She does report tingling sensation in her legs.  No limb ataxia or cerebellar signs. No abnormal tone appreciated.  DTR normal and symmetrical in bilateral upper and lower extremities  Musculoskeletal: Decreased L-spine ROM in all directions Tender to palpation bilateral shoulders right greater than left Tenderness to palpation throughout T-spine and L-spine paraspinal muscles Mild periscapular muscle tenderness Mild pain with palpation bilateral wrists Pain with palpation throughout bilateral joints of her hands Right knee very tender to palpation right  greater than left Does not appear to have allodynia today but does have diffuse tenderness from her mid thighs down to her feet No significant bilateral greater  trochanter tenderness Slump test negative but did result in pain in her knees Spurling's test negative  No joint swelling noted today      Assessment & Plan:  1) Fibromyalgia 2) Systemic lupus erythematosus 3) Possible sleep apnea 4) Depression.  Denies HI or SI  -Discussed foods that can be helpful for pain -Order TENS unit [employer]-patient reports she may try her dad's unit first -Will start Lyrica 75 mg twice daily, discussed possible side effects, discussed that as this can cause weight gain -Low-dose naltrexone would be a good option for her.  She would like to hold off due to cost.  She previously was on Suboxone / Subutex treatment that I think she was misinterpreting is similar to the naltrexone treatment.  After discussing differences in these medications she reports she would still be interested in low-dose naltrexone if cost was not prohibitive at a later time. -I recommended she proceed with the evaluation for possible sleep apnea as treatment may be provide benefit to her pain and fatigue -Consider aquatic therapy at a later time -Discussed trying yoga or tai chi

## 2023-04-24 NOTE — Progress Notes (Signed)
Office Visit Note  Patient: Valerie Ayala             Date of Birth: Nov 15, 1985           MRN: [id number]             PCP: Dettinger, Elige Radon, MD Referring: Dettinger, Elige Radon, MD Visit Date: 05/08/2023   Subjective:  Follow-up (Patient states she has a few things to talk about. )   History of Present Illness: Valerie Ayala is a 37 y.o. female here for follow up  for systemic lupus currently on treatment with Saphnelo infusions monthly.  She saw Dr. Natale Lay for management of chronic fibromyalgia and myofascial pain most recently addition of Lyrica 75 mg.  Overall she feels symptoms are generally in exacerbation.  She had a very stressful event when her son was in the hospital as a passenger in motor vehicle collision.  For several months has increased symptoms of concentration and short-term memory difficulty, unintentional weight gain, and fatigue.  Is also experiencing urinary frequency with occasional stress incontinence.  Occasional incontinence has been the case for years but the urgency is just a recent development.  She describes it as feeling "like she has a UTI but without any pain."  Previous HPI 02/02/2023 Valerie Ayala is a 37 y.o. female here for follow up for systemic lupus currently on treatment with Saphnelo infusions monthly first dose on March 6 second infusion slightly delayed to April 16 for perioperative management.  Overall she feels better than earlier in this year but she thinks there is less benefit after the more recent Saphnelo infusions compared to after her first 2 drug treatments.  She had 1 episode of extremely painful oral ulcers since the last visit which were a new symptom for her.  Leg swelling has been intermittent not too severe and without associated skin changes.  Worse problems currently just generalized fatigue and bodyaches with limited exertion tolerance.   Previous HPI 11/04/2022 Valerie Ayala is a 37 y.o. female here for follow up for systemic lupus  currently on treatment with Saphnelo infusions monthly first dose on March 6 second infusion slightly delayed to April 16 for perioperative management.  She reports noticing a definite change after infusion with improvement in her skin rashes swelling though she remains very fatigued.  Reports some waning of the benefit she noticed during the last week or 2 prior to repeat infusion.  But currently doing still doing pretty well from treatment on the 16th of last month.  She has questions about naloxone that she read read about regarding management of her fibromyalgia due to persistent body aches and fatigue.   Previous HPI 08/05/22 Valerie Ayala is a 37 y.o. female here for follow up for systemic lupus currently off treatment due to worsening in LFTs and lack of symptom response. She continues to have joint pain and swelling in hands and feet and skin rashes.  Raynaud's symptoms are worse during wintertime so far but not having any blistering or skin peeling changes.  Not much swelling back in her legs and no new mouth or nose ulcers.  Very severe fatigue is her most problematic symptom feels like she can sleep for 12 hours or more without having improvement in energy she has been referred for sleep study.   Previous HPI 06/02/22  Valerie Ayala is a 37 y.o. female here for follow up for SLE after last visit discontinued benlysta due to loss of  efficacy and starting methotrexate 15 mg PO weekly and folic acid 1 mg daily and continuing HCQ 300 mg daily. Also 2 week prednisone taper for general exacerbation of symptoms at that time likely triggered by preceding URI. Since then she continues to feel considerable joint pain and stiffness. Most affected in her bilateral shoulders and knees but also with hand pain limiting some activities such as coloring. Symptoms are worst overnight and for a few hours each morning. She had at least one episode of bilateral leg swelling lasting several days.    Previous  HPI 03/22/22 Valerie Ayala is a 37 y.o. female here for follow up for SLE on HCQ 300 mg daily and benlysta 200 mg [place] weekly. She is currently feeling worse pretty much all over. Especially joint pains and fatigue and increased skin rashes. She had an upper respiratory illness recently she recovered since about a week ago but has other symptoms are all worse since then. Leg swelling is doing okay and no new mouth and nose ulcers.   Previous HPI 10/14/2021 Valerie Ayala is a 37 y.o. female here for follow up for SLE on HCQ 300 mg daily and Benlysta 200 mg [place] weekly. At last visit significant worsening in lower extremity swelling but normal lab markers and negative for proteinuria.  Since the last visit he continues to do very poorly.  She has ongoing leg swelling itching sometimes there is redness discoloration.  Newer problem is feeling some proximal leg weakness occasional sense of her legs giving out or locking up when walking.  She is extremely fatigued almost every day.  Sometimes she cannot even get out of bed for hours and her mood is very poor due to decreased ability to do anything.  She is also been noticing some palpitations and lightheadedness getting short of breath with very brief or mild exertion.     Previous HPI 06/15/21 Valerie Ayala is a 37 y.o. female here for follow up for SLE on HCQ 300 mg daily and Benlysta 200 mg [place] weekly.  She was doing very well after our last visit until about a month ago now feels everything is horrible.  She has significant bilateral leg swelling with pitting edema developing increased tenderness and painful burning sensation with discolorations and skin changes in both legs.  She feels very fatigued.  Some skin dryness and peeling on the hands but no significant erythematous rash outside of the lower legs.  She recently also had flu with generalized body pains prescribed very large anterior cervical adenopathy.  This improved though she still has a residual ulcer in  the mouth. Labs earlier this month showing very low TSH level with recent slight downward adjustment of her thyroid replacement.  She quit smoking with use of Chantix.   Previous HPI 12/08/20 Valerie Ayala is a 37 y.o. female here for follow up for systemic lupus on hydroxychloroquine 300 mg p.o. daily. No signfiicant improvement since starting hydroxychloroquine 8 weeks ago.  She has not yet seen ophthalmology for retinal exam but plans to do so. Leg swelling continues to come and go, residual erythema and pain and burning sensations.  She continues to feel extremely fatigued as the most noticeable symptom.  She is also had a few mouth sores headaches and swollen glands but also describes the ongoing leg swelling sometimes with pain and discoloration.  Currently her left distal leg is most affected today.   Lupus manifestations Fatigue Arthralgias Photosensitive rash Oral ulcers Raynaud's  Lupus serology ANA 1:80 speckled dsDNA 49 Complement C3, C4 wnl Vit D 19  DMARD Hx Saphnelo 08/2022-current  Review of Systems  Constitutional:  Positive for fatigue.  HENT:  Positive for mouth dryness. Negative for mouth sores.   Eyes:  Positive for dryness.  Respiratory:  Positive for shortness of breath.   Cardiovascular:  Negative for chest pain and palpitations.  Gastrointestinal:  Positive for constipation. Negative for blood in stool and diarrhea.  Endocrine: Positive for increased urination.  Genitourinary:  Negative for involuntary urination.  Musculoskeletal:  Positive for joint pain, joint pain, joint swelling, myalgias, muscle weakness, morning stiffness, muscle tenderness and myalgias. Negative for gait problem.  Skin:  Positive for color change, hair loss and sensitivity to sunlight. Negative for rash.  Allergic/Immunologic: Negative for susceptible to infections.  Neurological:  Positive for headaches. Negative for dizziness.  Hematological:  Positive for swollen glands.   Psychiatric/Behavioral:  Positive for depressed mood. Negative for sleep disturbance. The patient is not nervous/anxious.     PMFS History:  Patient Active Problem List   Diagnosis Date Noted   Fibromyalgia 08/08/2022   ASCUS with positive high risk HPV cervical 09/15/2021   History of abnormal cervical Pap smear 09/08/2021   Swelling of lower extremity 12/08/2020   High risk medication use 12/08/2020   Hyperlipidemia 12/03/2020   Hypocalcemia 09/07/2020   Vitamin D deficiency 09/07/2020   Systemic lupus (HCC) 08/21/2020   Dry mouth 08/21/2020   Tobacco use disorder 08/21/2020   Arthralgia 08/21/2020   Raynaud's phenomenon 08/21/2020   Major depression, recurrent (HCC) 04/26/2018   Adult ADHD 12/16/2014   Asthma 05/07/2013   Hypothyroidism 11/26/2012   GAD (generalized anxiety disorder) 11/26/2012    Past Medical History:  Diagnosis Date   Anxiety    Takes xanax twice daily   ASCUS with positive high risk HPV cervical 09/15/2021   08/2021 +HPV 16/0ther ASCUS, needs colpo, immediate risk CIN3+ 9%   Asthma    Chronic headaches    "multiple times a day; they say it's related to thyroid"   Complication of anesthesia    Dysrhythmia    tachycardia   Fibromyalgia    Grave's disease    H/O seasonal allergies    Headache(784.0)    most days   Pneumonia 11/02/2011   "years ago"   PONV (postoperative nausea and vomiting)    Systemic lupus erythematosus (HCC)    Thyroid disease     Family History  Problem Relation Age of Onset   Hypertension Mother    Hyperlipidemia Mother    Diabetes Mother    Sleep apnea Mother    Hypertension Father    Hyperlipidemia Father    Hypothyroidism Father    Healthy Sister    Anesthesia problems Neg Hx    Hypotension Neg Hx    Malignant hyperthermia Neg Hx    Pseudochol deficiency Neg Hx    Past Surgical History:  Procedure Laterality Date   teeth pulled     back teeth   THYROIDECTOMY  11/02/2011   Procedure: THYROIDECTOMY;   Surgeon: Serena Colonel, MD;  Location: [facility];  Service: ENT;  Laterality: Bilateral;  TOTAL THYROIDECTOMY   TOTAL THYROIDECTOMY  11/02/2011   WISDOM TOOTH EXTRACTION  06/27/2002   Social History   Social History Narrative   Not on file   Immunization History  Administered Date(s) Administered   DTaP 08/24/1984, 11/05/1984, 12/31/1984, 12/24/1985   Hepatitis B 04/26/1996, 05/31/1996, 08/30/1996   IPV 08/24/1984, 11/05/1984, 12/24/1985  Influenza,inj,Quad PF,6+ Mos 05/25/2020, 05/27/2021, 03/24/2022   Influenza-Unspecified 06/06/2011, 04/25/2012   MMR 12/24/1985   PPD Test 03/16/2015, 03/23/2015   Pneumococcal Polysaccharide-23 11/03/2011   Tdap 12/10/2010, 07/28/2021   Zoster Recombinant(Shingrix) 05/01/2023     Objective: Vital Signs: BP 117/76 (BP Location: Left Arm, Patient Position: Sitting, Cuff Size: Normal)   Pulse 69   Resp 14   Ht 5\' 7"  (1.702 m)   Wt 190 lb (86.2 kg)   BMI 29.76 kg/m    Physical Exam Eyes:     Conjunctiva/sclera: Conjunctivae normal.  Cardiovascular:     Rate and Rhythm: Normal rate and regular rhythm.  Pulmonary:     Effort: Pulmonary effort is normal.     Breath sounds: Normal breath sounds.  Musculoskeletal:     Right lower leg: No edema.     Left lower leg: No edema.  Lymphadenopathy:     Cervical: No cervical adenopathy.  Skin:    General: Skin is warm and dry.     Findings: No rash.  Neurological:     Mental Status: She is alert.  Psychiatric:        Mood and Affect: Mood normal.      Musculoskeletal Exam:  Shoulders full ROM, tenderness to pressure no palpable swelling Elbows full ROM no tenderness or swelling Wrists full ROM no tenderness or swelling Fingers full ROM General Tenderness to pressure in finger joints without palpable swelling Hip normal internal and external rotation without pain, lateral hip tenderness to pressure on both sides without radiation Knees full ROM no tenderness or swelling Ankles full ROM no  tenderness or swelling   Investigation: No additional findings.  Imaging: No results found.  Recent Labs: Lab Results  Component Value Date   WBC 8.2 01/30/2023   HGB 12.0 01/30/2023   PLT 334 01/30/2023   NA 137 01/30/2023   K 3.9 01/30/2023   CL 99 01/30/2023   CO2 22 01/30/2023   GLUCOSE 107 (H) 01/30/2023   BUN 17 01/30/2023   CREATININE 0.75 01/30/2023   BILITOT 0.2 01/30/2023   ALKPHOS 76 01/30/2023   AST 12 01/30/2023   ALT 7 01/30/2023   PROT 6.5 01/30/2023   ALBUMIN 4.4 01/30/2023   CALCIUM 8.2 (L) 01/30/2023   GFRAA 80 02/27/2020   QFTBGOLDPLUS NEGATIVE 12/08/2020    Speciality Comments: No specialty comments available.  Procedures:  No procedures performed Allergies: Patient has no known allergies.   Assessment / Plan:     Visit Diagnoses: Systemic lupus erythematosus, unspecified SLE type, unspecified organ involvement status (HCC) - Plan: Sedimentation rate, Anti-DNA antibody, double-stranded  SLE appears well-controlled overall.  Increased symptoms suspect more fibromyalgia exacerbation due to stressors particularly her son hospitalization.  Checking sed rate double-stranded DNA.  Plan to continue on Saphnelo infusion 300 mg IV monthly.  High risk medication use - Saphnelo 300 mg IV monthly. - Plan: COMPLETE METABOLIC PANEL WITH GFR  Tolerating Saphnelo with no infusion problems.  Recent blood count from October checked was normal.  Will check complete metabolic panel.  Fibromyalgia - will refer to pain management. Referral placed 02/13/2023.  The leg pain is partially improved since starting Lyrica.  Agree with continued follow-up for largely myofascial pain with joint inflammation appearing well-controlled.  Raynaud's phenomenon without gangrene  Symptoms remain very mild no particular increased with slightly cooler weather.  No digital pitting or skin lesions or other changes.  Does continue to smoke.  Not on any stimulant medicines for ADHD  history.  Urinary frequency - Plan: Urinalysis, Routine w reflex microscopic  Describes mild urinary stress incontinence and urinary frequency.  I do not see any specific new medications that be contributory.  Checking urinalysis screening for any signs of UTI.  Can treat if abnormal otherwise can benefit from urogynecology evaluation.  Orders: Orders Placed This Encounter  Procedures   Sedimentation rate   Anti-DNA antibody, double-stranded   COMPLETE METABOLIC PANEL WITH GFR   Urinalysis, Routine w reflex microscopic   No orders of the defined types were placed in this encounter.    Follow-Up Instructions: Return in about 3 months (around 08/08/2023) for SLE on saphnelo f/u 3mos.   Fuller Plan, MD  Note - This record has been created using [employer].  Chart creation errors have been sought, but may not always  have been located. Such creation errors do not reflect on  the standard of medical care.

## 2023-04-27 ENCOUNTER — Ambulatory Visit: Payer: Medicaid Other | Admitting: Family Medicine

## 2023-05-01 ENCOUNTER — Ambulatory Visit (INDEPENDENT_AMBULATORY_CARE_PROVIDER_SITE_OTHER): Payer: Medicaid Other | Admitting: Family Medicine

## 2023-05-01 ENCOUNTER — Other Ambulatory Visit (HOSPITAL_COMMUNITY): Payer: Self-pay

## 2023-05-01 ENCOUNTER — Other Ambulatory Visit: Payer: Self-pay

## 2023-05-01 ENCOUNTER — Encounter: Payer: Self-pay | Admitting: Family Medicine

## 2023-05-01 VITALS — BP 97/64 | HR 77 | Ht 67.0 in | Wt 183.0 lb

## 2023-05-01 DIAGNOSIS — F331 Major depressive disorder, recurrent, moderate: Secondary | ICD-10-CM | POA: Diagnosis not present

## 2023-05-01 DIAGNOSIS — E782 Mixed hyperlipidemia: Secondary | ICD-10-CM

## 2023-05-01 DIAGNOSIS — E890 Postprocedural hypothyroidism: Secondary | ICD-10-CM | POA: Diagnosis not present

## 2023-05-01 DIAGNOSIS — F411 Generalized anxiety disorder: Secondary | ICD-10-CM

## 2023-05-01 DIAGNOSIS — Z23 Encounter for immunization: Secondary | ICD-10-CM | POA: Diagnosis not present

## 2023-05-01 MED ORDER — SERTRALINE HCL 100 MG PO TABS: 200.0000 mg | ORAL_TABLET | Freq: Every day | ORAL | 1 refills | Status: DC

## 2023-05-01 MED ORDER — BREXPIPRAZOLE 2 MG PO TABS
2.0000 mg | ORAL_TABLET | Freq: Every day | ORAL | 1 refills | Status: DC
  Filled 2023-05-01: qty 90, 90d supply, fill #0

## 2023-05-01 MED ORDER — SERTRALINE HCL 100 MG PO TABS
200.0000 mg | ORAL_TABLET | Freq: Every day | ORAL | 1 refills | Status: DC
  Filled 2023-05-01: qty 180, 90d supply, fill #0

## 2023-05-01 MED ORDER — BREXPIPRAZOLE 2 MG PO TABS: 2.0000 mg | ORAL_TABLET | Freq: Every day | ORAL | 1 refills | Status: DC

## 2023-05-01 MED ORDER — ATORVASTATIN CALCIUM 80 MG PO TABS
80.0000 mg | ORAL_TABLET | Freq: Every day | ORAL | 1 refills | Status: DC
  Filled 2023-05-01: qty 90, 90d supply, fill #0

## 2023-05-01 MED ORDER — ATORVASTATIN CALCIUM 80 MG PO TABS: 80.0000 mg | ORAL_TABLET | Freq: Every day | ORAL | 3 refills | Status: DC

## 2023-05-01 NOTE — Progress Notes (Signed)
BP 97/64   Pulse 77   Ht 5\' 7"  (1.702 m)   Wt 183 lb (83 kg)   SpO2 98%   BMI 28.66 kg/m    Subjective:   Patient ID: Valerie Ayala, female    DOB: 07-28-1985, 37 y.o.   MRN: [id number]  HPI: Valerie Ayala is a 37 y.o. female presenting on 05/01/2023 for Medical Management of Chronic Issues and Anxiety   HPI Major depression and anxiety recheck Patient comes in today for major depression and anxiety recheck.  She currently takes Rexulti and sertraline. Patient feels like anxiety and depression is a little high right now because her son was just in a major car accident almost died but aside from that she had been doing well on the medicine.  She denies any suicidal ideations or thoughts of hurting herself.    05/01/2023    3:07 PM 04/13/2023   10:43 AM 10/26/2022   11:22 AM 07/27/2022    1:45 PM 05/25/2022    1:18 PM  Depression screen PHQ 2/9  Decreased Interest 2 2 1 1 1   Down, Depressed, Hopeless 1 1 1 1 1   PHQ - 2 Score 3 3 2 2 2   Altered sleeping 3 3 3 3 3   Tired, decreased energy 3 3 3 3 3   Change in appetite 3 3 3 3 2   Feeling bad or failure about yourself  1 1 1 2 2   Trouble concentrating 2 2 1 2 2   Moving slowly or fidgety/restless 2 2 1 2  0  Suicidal thoughts 0 0 0 1 0  PHQ-9 Score 17 17 14 18 14   Difficult doing work/chores Not difficult at all  Not difficult at all Somewhat difficult Somewhat difficult    Hypothyroidism recheck Patient is coming in for thyroid recheck today as well. They deny any issues with hair changes or heat or cold problems or diarrhea or constipation. They deny any chest pain or palpitations. They are currently on levothyroxine 200 micrograms   Hyperlipidemia Patient is coming in for recheck of his hyperlipidemia. The patient is currently taking atorvastatin. They deny any issues with myalgias or history of liver damage from it. They deny any focal numbness or weakness or chest pain.   Relevant past medical, surgical, family and social history  reviewed and updated as indicated. Interim medical history since our last visit reviewed. Allergies and medications reviewed and updated.  Review of Systems  Constitutional:  Negative for chills and fever.  HENT:  Negative for congestion, ear discharge and ear pain.   Eyes:  Negative for redness and visual disturbance.  Respiratory:  Negative for chest tightness and shortness of breath.   Cardiovascular:  Negative for chest pain and leg swelling.  Genitourinary:  Negative for difficulty urinating and dysuria.  Skin:  Negative for rash.  Neurological:  Negative for light-headedness and headaches.  Psychiatric/Behavioral:  Positive for dysphoric mood. Negative for agitation, behavioral problems, self-injury, sleep disturbance and suicidal ideas. The patient is nervous/anxious.   All other systems reviewed and are negative.   Per HPI unless specifically indicated above   Allergies as of 05/01/2023   No Known Allergies      Medication List        Accurate as of May 01, 2023  3:28 PM. If you have any questions, ask your nurse or doctor.          albuterol 108 (90 Base) MCG/ACT inhaler Commonly known as: VENTOLIN HFA Inhale 2 puffs into  the lungs every 6 (six) hours as needed. For shortness of breath   atorvastatin 80 MG tablet Commonly known as: LIPITOR Take 1 tablet (80 mg total) by mouth daily.   brexpiprazole 2 MG Tabs tablet Commonly known as: Rexulti Take 1 tablet (2 mg total) by mouth daily.   cholecalciferol 1000 units tablet Commonly known as: VITAMIN D Take 1,000 Units by mouth daily.   ibuprofen 800 MG tablet Commonly known as: ADVIL Take 800 mg by mouth every 6 (six) hours as needed.   levothyroxine 200 MCG tablet Commonly known as: SYNTHROID Take 1 tablet (200 mcg total) by mouth daily.   magic mouthwash (lidocaine, diphenhydrAMINE, alum & mag hydroxide) suspension Swish and spit 5 mLs 4 (four) times daily as needed for mouth pain.    medroxyPROGESTERone 150 MG/ML injection Commonly known as: DEPO-PROVERA Inject 1 mL (150 mg total) into the muscle every 3 (three) months.   pregabalin 75 MG capsule Commonly known as: Lyrica Take 1 capsule (75 mg total) by mouth 2 (two) times daily.   SAPHNELO IV Inject into the vein every 30 (thirty) days.   sertraline 100 MG tablet Commonly known as: ZOLOFT Take 2 tablets (200 mg total) by mouth daily.   varenicline 1 MG tablet Commonly known as: CHANTIX Take 1 tablet (1 mg total) by mouth 2 (two) times daily.         Objective:   BP 97/64   Pulse 77   Ht 5\' 7"  (1.702 m)   Wt 183 lb (83 kg)   SpO2 98%   BMI 28.66 kg/m   Wt Readings from Last 3 Encounters:  05/01/23 183 lb (83 kg)  04/13/23 189 lb (85.7 kg)  02/02/23 181 lb (82.1 kg)    Physical Exam Vitals and nursing note reviewed.  Constitutional:      General: She is not in acute distress.    Appearance: She is well-developed. She is not diaphoretic.  Eyes:     Conjunctiva/sclera: Conjunctivae normal.  Cardiovascular:     Rate and Rhythm: Normal rate and regular rhythm.     Heart sounds: Normal heart sounds. No murmur heard. Pulmonary:     Effort: Pulmonary effort is normal. No respiratory distress.     Breath sounds: Normal breath sounds. No wheezing.  Musculoskeletal:        General: No swelling.  Skin:    General: Skin is warm and dry.     Findings: No rash.  Neurological:     Mental Status: She is alert and oriented to person, place, and time.     Coordination: Coordination normal.  Psychiatric:        Behavior: Behavior normal.       Assessment & Plan:   Problem List Items Addressed This Visit       Endocrine   Hypothyroidism - Primary   Relevant Orders   TSH     Other   GAD (generalized anxiety disorder)   Relevant Medications   brexpiprazole (REXULTI) 2 MG TABS tablet   sertraline (ZOLOFT) 100 MG tablet   Major depression, recurrent (HCC)   Relevant Medications    brexpiprazole (REXULTI) 2 MG TABS tablet   sertraline (ZOLOFT) 100 MG tablet   Hyperlipidemia   Relevant Medications   atorvastatin (LIPITOR) 80 MG tablet    Patient seems to be doing mostly well.  Dealing with her son's rash but other than that she is doing well emotionally and we will recheck her thyroid levels today. Follow up  plan: Return in about 3 months (around 08/01/2023), or if symptoms worsen or fail to improve, for Thyroid recheck.  Counseling provided for all of the vaccine components Orders Placed This Encounter  Procedures   TSH    Arville Care, MD [facility] 05/01/2023, 3:28 PM

## 2023-05-02 LAB — TSH: TSH: 7.66 u[IU]/mL — ABNORMAL HIGH (ref 0.450–4.500)

## 2023-05-02 NOTE — Addendum Note (Signed)
Addended by: Dorene Sorrow on: 05/02/2023 07:58 AM   Modules accepted: Orders

## 2023-05-03 ENCOUNTER — Ambulatory Visit: Payer: Medicaid Other

## 2023-05-04 DIAGNOSIS — M329 Systemic lupus erythematosus, unspecified: Secondary | ICD-10-CM | POA: Diagnosis not present

## 2023-05-08 ENCOUNTER — Encounter: Payer: Self-pay | Admitting: Internal Medicine

## 2023-05-08 ENCOUNTER — Telehealth: Payer: Self-pay | Admitting: Pharmacist

## 2023-05-08 ENCOUNTER — Ambulatory Visit: Payer: Medicaid Other | Attending: Internal Medicine | Admitting: Internal Medicine

## 2023-05-08 VITALS — BP 117/76 | HR 69 | Resp 14 | Ht 67.0 in | Wt 190.0 lb

## 2023-05-08 DIAGNOSIS — M329 Systemic lupus erythematosus, unspecified: Secondary | ICD-10-CM

## 2023-05-08 DIAGNOSIS — M797 Fibromyalgia: Secondary | ICD-10-CM | POA: Diagnosis not present

## 2023-05-08 DIAGNOSIS — Z79899 Other long term (current) drug therapy: Secondary | ICD-10-CM | POA: Diagnosis not present

## 2023-05-08 DIAGNOSIS — R350 Frequency of micturition: Secondary | ICD-10-CM | POA: Diagnosis not present

## 2023-05-08 DIAGNOSIS — I7300 Raynaud's syndrome without gangrene: Secondary | ICD-10-CM | POA: Diagnosis not present

## 2023-05-08 NOTE — Telephone Encounter (Signed)
Received fax from [facility] regarding Saphnelo IV [phone]) infusion that patient received on 05/04/2023. Dose: 300mg  Labs were not drawn.  Patient tolerated infusion without complications.  Changes/concerns since last visit:   Next Saphnelo infusion scheduled for 05/31/2023  Chesley Mires, PharmD, MPH, BCPS, CPP Clinical Pharmacist (Rheumatology and Pulmonology)

## 2023-05-09 LAB — COMPLETE METABOLIC PANEL WITH GFR
AG Ratio: 1.6 (calc) (ref 1.0–2.5)
ALT: 18 U/L (ref 6–29)
AST: 18 U/L (ref 10–30)
Albumin: 4.1 g/dL (ref 3.6–5.1)
Alkaline phosphatase (APISO): 75 U/L (ref 31–125)
BUN: 13 mg/dL (ref 7–25)
CO2: 24 mmol/L (ref 20–32)
Calcium: 7.7 mg/dL — ABNORMAL LOW (ref 8.6–10.2)
Chloride: 105 mmol/L (ref 98–110)
Creat: 0.76 mg/dL (ref 0.50–0.97)
Globulin: 2.6 g/dL (ref 1.9–3.7)
Glucose, Bld: 91 mg/dL (ref 65–99)
Potassium: 4.1 mmol/L (ref 3.5–5.3)
Sodium: 140 mmol/L (ref 135–146)
Total Bilirubin: 0.4 mg/dL (ref 0.2–1.2)
Total Protein: 6.7 g/dL (ref 6.1–8.1)
eGFR: 103 mL/min/{1.73_m2} (ref >=60)

## 2023-05-09 LAB — URINALYSIS, ROUTINE W REFLEX MICROSCOPIC
Bilirubin Urine: NEGATIVE
Glucose, UA: NEGATIVE
Hgb urine dipstick: NEGATIVE
Hyaline Cast: NONE SEEN /[LPF]
Ketones, ur: NEGATIVE
Leukocytes,Ua: NEGATIVE
Nitrite: NEGATIVE
RBC / HPF: NONE SEEN /[HPF] (ref 0–2)
Specific Gravity, Urine: 1.024 (ref 1.001–1.035)
WBC, UA: NONE SEEN /[HPF] (ref 0–5)
pH: 6 (ref 5.0–8.0)

## 2023-05-09 LAB — MICROSCOPIC MESSAGE

## 2023-05-09 LAB — SEDIMENTATION RATE: Sed Rate: 17 mm/h (ref 0–20)

## 2023-05-09 LAB — ANTI-DNA ANTIBODY, DOUBLE-STRANDED: ds DNA Ab: 24 [IU]/mL — ABNORMAL HIGH

## 2023-05-10 ENCOUNTER — Other Ambulatory Visit (HOSPITAL_COMMUNITY): Payer: Self-pay

## 2023-05-17 ENCOUNTER — Other Ambulatory Visit: Payer: Self-pay | Admitting: Family Medicine

## 2023-05-17 DIAGNOSIS — Z3009 Encounter for other general counseling and advice on contraception: Secondary | ICD-10-CM

## 2023-05-18 ENCOUNTER — Ambulatory Visit: Payer: Medicaid Other

## 2023-05-18 ENCOUNTER — Ambulatory Visit (INDEPENDENT_AMBULATORY_CARE_PROVIDER_SITE_OTHER): Payer: Medicaid Other

## 2023-05-18 DIAGNOSIS — Z23 Encounter for immunization: Secondary | ICD-10-CM

## 2023-05-18 DIAGNOSIS — Z3042 Encounter for surveillance of injectable contraceptive: Secondary | ICD-10-CM

## 2023-05-18 DIAGNOSIS — Z308 Encounter for other contraceptive management: Secondary | ICD-10-CM

## 2023-05-18 MED ORDER — MEDROXYPROGESTERONE ACETATE 150 MG/ML IM SUSP
150.0000 mg | INTRAMUSCULAR | Status: AC
  Administered 2023-05-18 – 2024-02-19 (×4): 150 mg via INTRAMUSCULAR

## 2023-05-18 NOTE — Progress Notes (Signed)
Medroxyprogesterone injection given to left upper outer quadrant.  Patient tolerated well.  Patient was scheduled for annual physical.

## 2023-05-29 ENCOUNTER — Telehealth: Payer: Self-pay

## 2023-05-29 NOTE — Telephone Encounter (Signed)
Marlaya Laperle (Key: [id number]) Rexulti 2MG  tablets Form [employer] [facility] Electronic [place] Form [phone] NCPDP) Created 4 days ago Sent to Plan 9 minutes ago Plan Response 9 minutes ago Submit Clinical Questions less than a minute ago Determination Wait for Determination Please wait for [employer] [facility] to return a determination.

## 2023-05-30 NOTE — Telephone Encounter (Signed)
Pharmacy Patient Advocate Encounter  Received notification from [facility] that Prior Authorization for Rexulti 2MG  tablets has been APPROVED from 05/29/23 to 05/28/24   PA #/Case ID/Reference #: [id number]

## 2023-06-01 ENCOUNTER — Telehealth: Payer: Self-pay | Admitting: Pharmacist

## 2023-06-01 NOTE — Telephone Encounter (Signed)
Received fax from [facility] that patient was no-call/no-show to Saphnelo infusion appointment on 05/31/2023  Next appt is 06/27/23  Chesley Mires, PharmD, MPH, BCPS, CPP Clinical Pharmacist (Rheumatology and Pulmonology)

## 2023-06-15 ENCOUNTER — Encounter: Payer: Self-pay | Admitting: Physical Medicine & Rehabilitation

## 2023-06-15 ENCOUNTER — Encounter: Payer: Medicaid Other | Attending: Physical Medicine & Rehabilitation | Admitting: Physical Medicine & Rehabilitation

## 2023-06-15 VITALS — BP 113/64 | HR 103 | Ht 67.0 in | Wt 188.0 lb

## 2023-06-15 DIAGNOSIS — M329 Systemic lupus erythematosus, unspecified: Secondary | ICD-10-CM | POA: Diagnosis not present

## 2023-06-15 DIAGNOSIS — F32A Depression, unspecified: Secondary | ICD-10-CM | POA: Diagnosis not present

## 2023-06-15 DIAGNOSIS — M797 Fibromyalgia: Secondary | ICD-10-CM | POA: Insufficient documentation

## 2023-06-15 MED ORDER — PREGABALIN 100 MG PO CAPS: 100.0000 mg | ORAL_CAPSULE | Freq: Two times a day (BID) | ORAL | 5 refills | Status: AC

## 2023-06-15 NOTE — Progress Notes (Signed)
Subjective:    Patient ID: Valerie Ayala, female    DOB: [birth year], 37 y.o.   MRN: [id number]  HPI  HPI 04/13/2019  Valerie Ayala is a 37 y.o. year old female  who  has a past medical history of Anxiety, ASCUS with positive high risk HPV cervical (09/15/2021), Asthma, Chronic headaches, Complication of anesthesia, Dysrhythmia, Fibromyalgia, Grave's disease, H/O seasonal allergies, Headache(784.0), Pneumonia (11/02/2011), PONV (postoperative nausea and vomiting), Systemic lupus erythematosus (HCC), and Thyroid disease.   They are presenting to PM&R clinic as a new patient for pain management evaluation. They were referred by Dr. Dimple Casey rheumatology for treatment of fibromyalgia pain.  Patient reports that she first developed blisters after sun exposure about 4 years ago.  She developed a lot of pain and numbness in her hands.  She was seen by rheumatology and diagnosed with systemic lupus erythematosus.  She was later also diagnosed with fibromyalgia and Raynaud's phenomenon without gangrene.  Patient reports she continues to have a lot of pain in her hands only often feel achy.  She also has severe pain in her legs primarily from her knees down to her feet.  Even her cats rubbing against her feet and legs will cause her pain.  She feels like her joints are stiff all day.  She often has bodyaches.  She has pain in her middle and lower back.  Neck is not particularly painful.  Her right shoulder will often give her pain with movement.  She says her elbows often feel very sensitive.  She reports she will have swelling in her legs intermittently.  She reports overall weight gain due to inactivity.  Patient reports she sleeps at night but continues to be tired and asleep during the day.  Patient reports her PCP suspects sleep apnea, she says she is working on getting evaluated for this.  Patient reports she is not very active and has poor activity tolerance. She reports history of depression but says it is  overall well-managed on current medications.  She reports she tried multiple other medications in the past and current regimen is what works the best.  Patient reports she has a history of misuse of opioid medications.  She would like to avoid this type of medication.  Patient says she was reading up on naltrexone treatment.  She was previously on Suboxone/Subutex treatment and she found this to be beneficial.  Red flag symptoms: No red flags for back pain endorsed in Hx or ROS  Medications tried: Topical medications- Denies  Nsaids - Ibuprofen helps a litle Tylenol -helps a litlle Opiates   She would like to avoid due to hx of opiod abuse  Gabapentin - Used for trigeminal neuralgia- resovled so she stopped taking  Lyrica - Denies  TCAs - denies  SNRIs - Cymbalta used in the past for mood, made her angrier     Other treatments: PT- Denies Chiropractor- didn't help   TENs unit - Helped a little  Injections - Denies  Surgery- denies   Prior UDS results: No results found for: "LABOPIA", "COCAINSCRNUR", "LABBENZ", "AMPHETMU", "THCU", "LABBARB"   Interval history 06/15/2023 Pain location and quality overall unchanged from last visit however severity has improved since starting Lyrica.  She is tolerating the medication not having significant side effects.  She has not yet tried TENS unit, will consider doing this today when she sees her dad.  We discussed gradually increasing level of activity to help with pain over time.  Mood overall stable.  Pain Inventory Average Pain 6 Pain Right Now 4 My pain is intermittent, constant, sharp, burning, tingling, and aching  In the last 24 hours, has pain interfered with the following? General activity 3 Relation with others 4 Enjoyment of life 6 What TIME of day is your pain at its worst? morning , daytime, and evening Sleep (in general) Good  Pain is worse with: bending, sitting, and some activites Pain improves with: rest,  therapy/exercise, and medication Relief from Meds: 5     Family History  Problem Relation Age of Onset   Hypertension Mother    Hyperlipidemia Mother    Diabetes Mother    Sleep apnea Mother    Hypertension Father    Hyperlipidemia Father    Hypothyroidism Father    Healthy Sister    Anesthesia problems Neg Hx    Hypotension Neg Hx    Malignant hyperthermia Neg Hx    Pseudochol deficiency Neg Hx    Social History   Socioeconomic History   Marital status: Married    Spouse name: Not on file   Number of children: Not on file   Years of education: Not on file   Highest education level: Not on file  Occupational History   Not on file  Tobacco Use   Smoking status: Some Days    Current packs/day: 0.00    Average packs/day: 0.5 packs/day for 12.0 years (6.0 ttl pk-yrs)    Types: Cigarettes    Start date: 04/27/2009    Last attempt to quit: 04/27/2021    Years since quitting: 2.1    Passive exposure: Current   Smokeless tobacco: Never  Vaping Use   Vaping status: Never Used  Substance and Sexual Activity   Alcohol use: Not Currently   Drug use: No   Sexual activity: Yes    Birth control/protection: Injection  Other Topics Concern   Not on file  Social History Narrative   Not on file   Social Drivers of Health   Financial Resource Strain: Low Risk  (09/08/2021)   Overall Financial Resource Strain (CARDIA)    Difficulty of Paying Living Expenses: Not hard at all  Food Insecurity: No Food Insecurity (09/08/2021)   Hunger Vital Sign    Worried About Running Out of Food in the Last Year: Never true    Ran Out of Food in the Last Year: Never true  Transportation Needs: No Transportation Needs (09/08/2021)   PRAPARE - Administrator, Civil Service (Medical): No    Lack of Transportation (Non-Medical): No  Physical Activity: Insufficiently Active (09/08/2021)   Exercise Vital Sign    Days of Exercise per Week: 1 day    Minutes of Exercise per Session: 40 min   Stress: No Stress Concern Present (09/08/2021)   [employer] of Occupational Health - Occupational Stress Questionnaire    Feeling of Stress : Not at all  Social Connections: Moderately Isolated (09/08/2021)   Social Connection and Isolation Panel [NHANES]    Frequency of Communication with Friends and Family: Twice a week    Frequency of Social Gatherings with Friends and Family: Once a week    Attends Religious Services: Never    Database administrator or Organizations: No    Attends Banker Meetings: Never    Marital Status: Married   Past Surgical History:  Procedure Laterality Date   teeth pulled     back teeth   THYROIDECTOMY  11/02/2011   Procedure: THYROIDECTOMY;  Surgeon:  Serena Colonel, MD;  Location: [facility];  Service: ENT;  Laterality: Bilateral;  TOTAL THYROIDECTOMY   TOTAL THYROIDECTOMY  11/02/2011   WISDOM TOOTH EXTRACTION  06/27/2002   Past Medical History:  Diagnosis Date   Anxiety    Takes xanax twice daily   ASCUS with positive high risk HPV cervical 09/15/2021   08/2021 +HPV 16/0ther ASCUS, needs colpo, immediate risk CIN3+ 9%   Asthma    Chronic headaches    "multiple times a day; they say it's related to thyroid"   Complication of anesthesia    Dysrhythmia    tachycardia   Fibromyalgia    Grave's disease    H/O seasonal allergies    Headache(784.0)    most days   Pneumonia 11/02/2011   "years ago"   PONV (postoperative nausea and vomiting)    Systemic lupus erythematosus (HCC)    Thyroid disease    BP 113/64   Pulse (!) 103   Ht 5\' 7"  (1.702 m)   Wt 188 lb (85.3 kg)   SpO2 99%   BMI 29.44 kg/m   Opioid Risk Score:   Fall Risk Score:  `1  Depression screen [facility] 2/9     05/01/2023    3:07 PM 04/13/2023   10:43 AM 10/26/2022   11:22 AM 07/27/2022    1:45 PM 05/25/2022    1:18 PM 03/24/2022    9:07 AM 11/19/2021   10:15 AM  Depression screen PHQ 2/9  Decreased Interest 2 2 1 1 1 3 2   Down, Depressed, Hopeless 1 1 1 1 1 3 1    PHQ - 2 Score 3 3 2 2 2 6 3   Altered sleeping 3 3 3 3 3 3 3   Tired, decreased energy 3 3 3 3 3 3 3   Change in appetite 3 3 3 3 2 3 1   Feeling bad or failure about yourself  1 1 1 2 2 3 1   Trouble concentrating 2 2 1 2 2 2 1   Moving slowly or fidgety/restless 2 2 1 2  0 2 1  Suicidal thoughts 0 0 0 1 0 2 0  PHQ-9 Score 17 17 14 18 14 24 13   Difficult doing work/chores Not difficult at all  Not difficult at all Somewhat difficult Somewhat difficult Extremely dIfficult Very difficult    Review of Systems  Constitutional:        Weight gain  Respiratory:  Positive for apnea.   Gastrointestinal:  Positive for abdominal pain and constipation.  Musculoskeletal:        Spasms  Skin:  Positive for rash.  Neurological:  Positive for dizziness, weakness and numbness.       Tingling  Psychiatric/Behavioral:         Depression  All other systems reviewed and are negative.      Objective:   Physical Exam   Gen: no distress, normal appearing HEENT: oral mucosa pink and moist, NCAT Chest: normal effort, normal rate of breathing Abd: soft, non-distended Ext: no edema Psych: pleasant, normal affect Skin: intact Neuro: Alert and oriented, follows commands, normal speech and language, good insight and awareness, able to provide coherent history Grossly 4+ out of 5 on manual muscle testing throughout, appears to be giveaway weakness limited by pain/effort Sensory exam normal for light touch and pain in all 4 limbs.  She does report tingling sensation in her legs.  No limb ataxia or cerebellar signs. No abnormal tone appreciated.  DTR normal and symmetrical in bilateral upper and  lower extremities  Musculoskeletal: Decreased L-spine ROM in all directions Tender to palpation bilateral shoulders right greater than left Tenderness to palpation throughout T-spine and L-spine paraspinal muscles Mild periscapular muscle tenderness Mild pain with palpation bilateral wrists Pain with palpation  throughout bilateral joints of her hands Right knee very tender to palpation right greater than left Does not appear to have allodynia today but does have diffuse tenderness from her mid thighs down to her feet No significant bilateral greater trochanter tenderness Slump test negative but did result in pain in her knees Spurling's test negative  No joint swelling noted today  Sore throughout- not neck      Assessment & Plan:  1) Fibromyalgia 2) Systemic lupus erythematosus 3) Possible sleep apnea 4) Depression.  Denies HI or SI  -Discussed foods that can be helpful for pain -Order TENS unit [employer]-patient reports she may try her dad's unit first-she will see him today and try to ask about this -Increase Lyrica to 100 mg twice daily -Low-dose naltrexone would be a good option for her.  She would like to hold off due to cost.  She previously was on Suboxone / Subutex treatment that I think she was misinterpreting is similar to the naltrexone treatment.  After discussing differences in these medications she reports she would still be interested in low-dose naltrexone if cost was not prohibitive at a later time. -I recommended she proceed with the evaluation for possible sleep apnea as treatment may be provide benefit to her pain and fatigue -Consider aquatic therapy at a later time -Discussed trying yoga or tai chi

## 2023-06-20 DIAGNOSIS — M329 Systemic lupus erythematosus, unspecified: Secondary | ICD-10-CM | POA: Diagnosis not present

## 2023-06-22 ENCOUNTER — Telehealth: Payer: Self-pay | Admitting: Pharmacist

## 2023-06-22 NOTE — Telephone Encounter (Signed)
Received fax from [facility] regarding Saphnelo IV [phone])  infusion that patient received on 06/20/23. Dose: 300mg  Labs were not drawn.  Patient tolerated infusion without complications.  Changes/concerns since last visit: none  Next Saphnelo infusion scheduled for 07/18/23  Chesley Mires, PharmD, MPH, BCPS, CPP Clinical Pharmacist (Rheumatology and Pulmonology)

## 2023-07-03 ENCOUNTER — Telehealth: Payer: Self-pay | Admitting: Pharmacist

## 2023-07-03 NOTE — Telephone Encounter (Signed)
 Received fax from [facility] requesting Plan of Treatment renewal for SAPHNELO  by 07/24/2023  Signed by Dr. Jeannetta and faxed to [facility] with clinicals  Labs: CBC w diff, CMET, C3/C4 complement, anti-DNA antibody, and ESR every 3 months  Dose: 300mg  every 4 weeks Premeds: APAP 650mg  p.o. and diphenhydramine  25mg  p.o.  Fax: [phone] Phone: [phone]

## 2023-07-18 ENCOUNTER — Telehealth: Payer: Self-pay | Admitting: Pharmacist

## 2023-07-18 DIAGNOSIS — M329 Systemic lupus erythematosus, unspecified: Secondary | ICD-10-CM | POA: Diagnosis not present

## 2023-07-18 NOTE — Telephone Encounter (Signed)
Received fax from [facility] regarding Saphnelo IV [phone]) infusion that patient received on 07/18/2023. Dose: 300mg  Labs were drawn - CBC, CMP, ESR, DNA, C3/C4  Patient tolerated infusion without complications.  Changes/concerns since last visit: none  Next [place] infusion scheduled for 08/15/2023  Chesley Mires, PharmD, MPH, BCPS, CPP Clinical Pharmacist (Rheumatology and Pulmonology)

## 2023-07-25 NOTE — Progress Notes (Signed)
Office Visit Note  Patient: Valerie Ayala             Date of Birth: [birth year]           MRN: [id number]             PCP: Dettinger, Elige Radon, MD Referring: Dettinger, Elige Radon, MD Visit Date: 08/08/2023   Subjective:  Follow-up (Patient states she feels like she is getting worse. )   Discussed the use of AI scribe software for clinical note transcription with the patient, who gave verbal consent to proceed.  History of Present Illness   Valerie Ayala is a 38 y.o. female here for follow up for systemic lupus currently on treatment with Saphnelo infusions 300 mg IV monthly.    Over the past two to three months, she has experienced worsening symptoms, including persistent flu-like symptoms, fatigue, and generalized pain. Despite recent lab results from November showing improvement in lupus markers, she feels her condition is deteriorating.  She describes significant leg swelling, particularly in the left leg. This problem is recurrent, had been worse in the past but trending worse now. The swelling worsens with physical activity, such as cleaning or walking, and is accompanied by pain. She takes vitamin D daily but not calcium supplements.  Severe fatigue and muscle weakness are present, with minimal physical activity, like vacuuming, resulting in prolonged pain and inability to raise her arms. Her muscles feel depleted, and she struggles with walking, often needing support to prevent falling.  She experiences frequent urination without pain or blood, feeling the need to urinate constantly with little output each time. No symptoms typical of a bladder infection are present.  She has a history of constipation but experienced diarrhea following a recent flu episode. No nausea is present, but she notes a generally dry mouth.  Persistent itching, particularly after leg swelling subsides, leads to skin damage from scratching. 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  She has been taking Lyrica for pain management but finds it ineffective. Recently increased to 200 mg BID. She previously   She mentions a history of shingles and has received the first dose of the Shingrix vaccine, with the second dose planned soon.   Previous HPI 05/08/2023 Valerie Ayala is a 38 y.o. female here for follow up  for systemic lupus currently on treatment with Saphnelo infusions monthly.  She saw Dr. Natale Lay for management of chronic fibromyalgia and myofascial pain most recently addition of Lyrica 75 mg.  Overall she feels symptoms are generally in exacerbation.  She had a very stressful event when her son was in the hospital as a passenger in motor vehicle collision.  For several months has increased symptoms of concentration and short-term memory difficulty, unintentional weight gain, and fatigue.  Is also experiencing urinary frequency with occasional stress incontinence.  Occasional incontinence has been the case for years but the urgency is just a recent development.  She describes it as feeling "like she has a UTI but without any pain."   Previous HPI 02/02/2023 Valerie Ayala is a 38 y.o. female here for follow up for systemic lupus currently on treatment with Saphnelo infusions monthly first dose on March 6 second infusion slightly delayed to April 16 for perioperative management.  Overall she feels better than earlier in this year but she thinks there is less benefit after the more recent Saphnelo infusions compared to after her first 2 drug  treatments.  She had 1 episode of extremely painful oral ulcers since the last visit which were a new symptom for her.  Leg swelling has been intermittent not too severe and without associated skin changes.  Worse problems currently just generalized fatigue and bodyaches with limited exertion tolerance.   Previous HPI 11/04/2022 LATRENDA Ayala is a 38 y.o. female here for follow up for systemic lupus  currently on treatment with Saphnelo infusions monthly first dose on March 6 second infusion slightly delayed to April 16 for perioperative management.  She reports noticing a definite change after infusion with improvement in her skin rashes swelling though she remains very fatigued.  Reports some waning of the benefit she noticed during the last week or 2 prior to repeat infusion.  But currently doing still doing pretty well from treatment on the 16th of last month.  She has questions about naloxone that she read read about regarding management of her fibromyalgia due to persistent body aches and fatigue.   Previous HPI 08/05/22 JAKYLAH Ayala is a 38 y.o. female here for follow up for systemic lupus currently off treatment due to worsening in LFTs and lack of symptom response. She continues to have joint pain and swelling in hands and feet and skin rashes.  Raynaud's symptoms are worse during wintertime so far but not having any blistering or skin peeling changes.  Not much swelling back in her legs and no new mouth or nose ulcers.  Very severe fatigue is her most problematic symptom feels like she can sleep for 12 hours or more without having improvement in energy she has been referred for sleep study.   Previous HPI 06/02/22  Valerie Ayala is a 38 y.o. female here for follow up for SLE after last visit discontinued benlysta due to loss of efficacy and starting methotrexate 15 mg PO weekly and folic acid 1 mg daily and continuing HCQ 300 mg daily. Also 2 week prednisone taper for general exacerbation of symptoms at that time likely triggered by preceding URI. Since then she continues to feel considerable joint pain and stiffness. Most affected in her bilateral shoulders and knees but also with hand pain limiting some activities such as coloring. Symptoms are worst overnight and for a few hours each morning. She had at least one episode of bilateral leg swelling lasting several days.    Previous  HPI 03/22/22 ALLESANDRA Ayala is a 38 y.o. female here for follow up for SLE on HCQ 300 mg daily and benlysta 200 mg [place] weekly. She is currently feeling worse pretty much all over. Especially joint pains and fatigue and increased skin rashes. She had an upper respiratory illness recently she recovered since about a week ago but has other symptoms are all worse since then. Leg swelling is doing okay and no new mouth and nose ulcers.   Previous HPI 10/14/2021 MELLONY DANZIGER is a 38 y.o. female here for follow up for SLE on HCQ 300 mg daily and Benlysta 200 mg [place] weekly. At last visit significant worsening in lower extremity swelling but normal lab markers and negative for proteinuria.  Since the last visit he continues to do very poorly.  She has ongoing leg swelling itching sometimes there is redness discoloration.  Newer problem is feeling some proximal leg weakness occasional sense of her legs giving out or locking up when walking.  She is extremely fatigued almost every day.  Sometimes she cannot even get out of bed for hours and  her mood is very poor due to decreased ability to do anything.  She is also been noticing some palpitations and lightheadedness getting short of breath with very brief or mild exertion.     Previous HPI 06/15/21 JOURNI MOFFA is a 38 y.o. female here for follow up for SLE on HCQ 300 mg daily and Benlysta 200 mg [place] weekly.  She was doing very well after our last visit until about a month ago now feels everything is horrible.  She has significant bilateral leg swelling with pitting edema developing increased tenderness and painful burning sensation with discolorations and skin changes in both legs.  She feels very fatigued.  Some skin dryness and peeling on the hands but no significant erythematous rash outside of the lower legs.  She recently also had flu with generalized body pains prescribed very large anterior cervical adenopathy.  This improved though she still has a residual ulcer in  the mouth. Labs earlier this month showing very low TSH level with recent slight downward adjustment of her thyroid replacement.  She quit smoking with use of Chantix.   Previous HPI 12/08/20 FELESHIA ZUNDEL is a 38 y.o. female here for follow up for systemic lupus on hydroxychloroquine 300 mg p.o. daily. No signfiicant improvement since starting hydroxychloroquine 8 weeks ago.  She has not yet seen ophthalmology for retinal exam but plans to do so. Leg swelling continues to come and go, residual erythema and pain and burning sensations.  She continues to feel extremely fatigued as the most noticeable symptom.  She is also had a few mouth sores headaches and swollen glands but also describes the ongoing leg swelling sometimes with pain and discoloration.  Currently her left distal leg is most affected today.   Lupus manifestations Fatigue Arthralgias Photosensitive rash Oral ulcers Raynaud's   Lupus serology ANA 1:80 speckled dsDNA 49 Complement C3, C4 wnl Vit D 19   DMARD Hx Saphnelo 08/2022-current   Review of Systems  Constitutional:  Positive for fatigue.  HENT:  Positive for mouth dryness. Negative for mouth sores.   Eyes:  Positive for dryness.  Respiratory:  Positive for shortness of breath.   Cardiovascular:  Positive for palpitations. Negative for chest pain.  Gastrointestinal:  Positive for constipation and diarrhea. Negative for blood in stool.  Endocrine: Positive for increased urination.  Genitourinary:  Negative for involuntary urination.  Musculoskeletal:  Positive for joint pain, gait problem, joint pain, joint swelling, myalgias, muscle weakness, morning stiffness, muscle tenderness and myalgias.  Skin:  Positive for color change, hair loss and sensitivity to sunlight. Negative for rash.  Allergic/Immunologic: Positive for susceptible to infections.  Neurological:  Positive for headaches. Negative for dizziness.  Hematological:  Positive for swollen glands.   Psychiatric/Behavioral:  Positive for depressed mood. Negative for sleep disturbance. The patient is not nervous/anxious.     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   Fibromyalgia 08/08/2022   ASCUS with positive high risk HPV cervical 09/15/2021   History of abnormal cervical Pap smear 09/08/2021   Swelling of lower extremity 12/08/2020   High risk medication use 12/08/2020   Hyperlipidemia 12/03/2020   Hypocalcemia 09/07/2020   Vitamin D deficiency 09/07/2020   Systemic lupus (HCC) 08/21/2020   Dry mouth 08/21/2020   Tobacco use disorder 08/21/2020   Arthralgia 08/21/2020   Raynaud's phenomenon 08/21/2020   Major depression, recurrent (HCC) 04/26/2018   Adult ADHD 12/16/2014   Asthma 05/07/2013  Hypothyroidism 11/26/2012   GAD (generalized anxiety disorder) 11/26/2012    Past Medical History:  Diagnosis Date   Anxiety    Takes xanax twice daily   ASCUS with positive high risk HPV cervical 09/15/2021   08/2021 +HPV 16/0ther ASCUS, needs colpo, immediate risk CIN3+ 9%   Asthma    Chronic headaches    "multiple times a day; they say it's related to thyroid"   Complication of anesthesia    Dysrhythmia    tachycardia   Fibromyalgia    Grave's disease    H/O seasonal allergies    Headache(784.0)    most days   Pneumonia 11/02/2011   "years ago"   PONV (postoperative nausea and vomiting)    Systemic lupus erythematosus (HCC)    Thyroid disease     Family History  Problem Relation Age of Onset   Hypertension Mother    Hyperlipidemia Mother    Diabetes Mother    Sleep apnea Mother    Hypertension Father    Hyperlipidemia Father    Hypothyroidism Father    Healthy Sister    Anesthesia problems Neg Hx    Hypotension Neg Hx    Malignant hyperthermia Neg Hx    Pseudochol deficiency Neg Hx    Past Surgical History:  Procedure Laterality Date   teeth pulled     back teeth   THYROIDECTOMY  11/02/2011    Procedure: THYROIDECTOMY;  Surgeon: Serena Colonel, MD;  Location: [facility] OR;  Service: ENT;  Laterality: Bilateral;  TOTAL THYROIDECTOMY   TOTAL THYROIDECTOMY  11/02/2011   WISDOM TOOTH EXTRACTION  06/27/2002   Social History   Social History Narrative   Not on file   Immunization History  Administered Date(s) Administered   DTaP 08/24/1984, 11/05/1984, 12/31/1984, 12/24/1985   Hepatitis B 04/26/1996, 05/31/1996, 08/30/1996   IPV 08/24/1984, 11/05/1984, 12/24/1985   Influenza, Seasonal, Injecte, Preservative Fre 05/18/2023   Influenza,inj,Quad PF,6+ Mos 05/25/2020, 05/27/2021, 03/24/2022   Influenza-Unspecified 06/06/2011, 04/25/2012   MMR 12/24/1985   PPD Test 03/16/2015, 03/23/2015   Pneumococcal Polysaccharide-23 11/03/2011   Tdap 12/10/2010, 07/28/2021   Zoster Recombinant(Shingrix) 05/01/2023     Objective: Vital Signs: BP (!) 148/77 (BP Location: Left Arm, Patient Position: Sitting, Cuff Size: Large)   Pulse 60   Resp 14   Ht 5\' 7"  (1.702 m)   Wt 189 lb (85.7 kg)   BMI 29.60 kg/m    Physical Exam HENT:     Mouth/Throat:     Comments: Faint skin cracking on corners of mouth No oral or nasal ulcers visible Eyes:     Conjunctiva/sclera: Conjunctivae normal.  Cardiovascular:     Rate and Rhythm: Normal rate and regular rhythm.  Pulmonary:     Effort: Pulmonary effort is normal.     Breath sounds: Normal breath sounds.  Lymphadenopathy:     Cervical: No cervical adenopathy.  Skin:    General: Skin is warm and dry.     Comments: Trace edema b/l ankles  Neurological:     Mental Status: She is alert.  Psychiatric:        Mood and Affect: Mood normal.      Musculoskeletal Exam:  Shoulders full ROM no tenderness or swelling Elbows full ROM no tenderness or swelling Wrists full ROM no tenderness or swelling Fingers full ROM no tenderness or swelling Tenderness to pressure over low back muscles and bilateral hips, no pain radiation Knees full ROM no tenderness or  swelling Ankles full ROM no tenderness or swelling  Investigation: No additional findings.  Imaging: No results found.  Recent Labs: Lab Results  Component Value Date   WBC 8.2 01/30/2023   HGB 12.0 01/30/2023   PLT 334 01/30/2023   NA 140 05/08/2023   K 4.1 05/08/2023   CL 105 05/08/2023   CO2 24 05/08/2023   GLUCOSE 91 05/08/2023   BUN 13 05/08/2023   CREATININE 0.76 05/08/2023   BILITOT 0.4 05/08/2023   ALKPHOS 76 01/30/2023   AST 18 05/08/2023   ALT 18 05/08/2023   PROT 6.7 05/08/2023   ALBUMIN 4.4 01/30/2023   CALCIUM 7.7 (L) 05/08/2023   GFRAA 80 02/27/2020   QFTBGOLDPLUS NEGATIVE 12/08/2020    Speciality Comments: No specialty comments available.  Procedures:  No procedures performed Allergies: Patient has no known allergies.   Assessment / Plan:     Visit Diagnoses: Systemic lupus erythematosus, unspecified SLE type, unspecified organ involvement status (HCC) - Plan: Anti-DNA antibody, double-stranded, C3 and C4, Sedimentation rate Worsening symptoms despite improved lab markers. Persistent fatigue, musculoskeletal pain, and weakness. Recent flu infection may have triggered a flare. -Consider reinitiating Hydroxychloroquine in combination with Saphnelo, pending lab results. -Continue saphnelo 300 mg IV monthly -Consider short course of steroids if lab results suggest active flare.  High risk medication use - Saphnelo infusion 300 mg IV monthly. - Plan: CBC with Differential/Platelet, COMPLETE METABOLIC PANEL WITH GFR Medication tolerated fine with no recent serious illness. No infusion reaction. -Checking CBC and CMP for medication monitoring and considering adding back csDMARD.  Fibromyalgia - Lyrica  Angular cheilitis - Plan: Iron, TIBC and Ferritin Panel, Vitamin B2(Riboflavin),Plasma, Zinc Recurrent, associated with dry skin and mouth. No current treatment. -Provide patient with information about condition and symptomatic management. -Check iron,  zinc, and vitamin B2 (riboflavin) levels due to potential association with angular cheilitis.   Vitamin D deficiency - Plan: VITAMIN D 25 Hydroxy (Vit-D Deficiency, Fractures) On daily supplement. Recent low calcium level which was previously associated with low vitamin D. -Recheck calcium and vitamin D levels.  Lower extremity edema Recurrent, associated with activity. No current diuretic therapy. -Check metabolic panel to ensure normal electrolyte balance. -Consider trial of Lasix (furosemide) as needed for symptomatic relief of edema, pending lab results.   Orders: Orders Placed This Encounter  Procedures   Iron, TIBC and Ferritin Panel   Vitamin B2(Riboflavin),Plasma   Zinc   CBC with Differential/Platelet   COMPLETE METABOLIC PANEL WITH GFR   Anti-DNA antibody, double-stranded   C3 and C4   VITAMIN D 25 Hydroxy (Vit-D Deficiency, Fractures)   Sedimentation rate   No orders of the defined types were placed in this encounter.    Follow-Up Instructions: Return in about 3 months (around 11/05/2023) for SLE on saphnelo ?HCQ/?AZA f/u 3mos.   Fuller Plan, MD  Note - This record has been created using [employer].  Chart creation errors have been sought, but may not always  have been located. Such creation errors do not reflect on  the standard of medical care.

## 2023-08-03 ENCOUNTER — Ambulatory Visit: Payer: Medicaid Other | Admitting: Family Medicine

## 2023-08-08 ENCOUNTER — Encounter: Payer: Self-pay | Admitting: Internal Medicine

## 2023-08-08 ENCOUNTER — Ambulatory Visit: Payer: Medicaid Other | Attending: Internal Medicine | Admitting: Internal Medicine

## 2023-08-08 VITALS — BP 148/77 | HR 60 | Resp 14 | Ht 67.0 in | Wt 189.0 lb

## 2023-08-08 DIAGNOSIS — R350 Frequency of micturition: Secondary | ICD-10-CM | POA: Diagnosis not present

## 2023-08-08 DIAGNOSIS — K130 Diseases of lips: Secondary | ICD-10-CM | POA: Insufficient documentation

## 2023-08-08 DIAGNOSIS — E559 Vitamin D deficiency, unspecified: Secondary | ICD-10-CM

## 2023-08-08 DIAGNOSIS — I7300 Raynaud's syndrome without gangrene: Secondary | ICD-10-CM | POA: Diagnosis not present

## 2023-08-08 DIAGNOSIS — M329 Systemic lupus erythematosus, unspecified: Secondary | ICD-10-CM | POA: Diagnosis not present

## 2023-08-08 DIAGNOSIS — Z79899 Other long term (current) drug therapy: Secondary | ICD-10-CM

## 2023-08-08 DIAGNOSIS — M797 Fibromyalgia: Secondary | ICD-10-CM

## 2023-08-14 ENCOUNTER — Other Ambulatory Visit: Payer: Self-pay | Admitting: Family Medicine

## 2023-08-14 ENCOUNTER — Ambulatory Visit: Payer: Medicaid Other | Admitting: Family Medicine

## 2023-08-14 ENCOUNTER — Encounter: Payer: Self-pay | Admitting: Family Medicine

## 2023-08-14 VITALS — BP 120/87 | HR 115 | Ht 67.0 in | Wt 199.0 lb

## 2023-08-14 DIAGNOSIS — F331 Major depressive disorder, recurrent, moderate: Secondary | ICD-10-CM

## 2023-08-14 DIAGNOSIS — Z23 Encounter for immunization: Secondary | ICD-10-CM | POA: Diagnosis not present

## 2023-08-14 DIAGNOSIS — Z0000 Encounter for general adult medical examination without abnormal findings: Secondary | ICD-10-CM

## 2023-08-14 DIAGNOSIS — E890 Postprocedural hypothyroidism: Secondary | ICD-10-CM

## 2023-08-14 DIAGNOSIS — Z3009 Encounter for other general counseling and advice on contraception: Secondary | ICD-10-CM

## 2023-08-14 DIAGNOSIS — F411 Generalized anxiety disorder: Secondary | ICD-10-CM

## 2023-08-14 DIAGNOSIS — M329 Systemic lupus erythematosus, unspecified: Secondary | ICD-10-CM | POA: Diagnosis not present

## 2023-08-14 DIAGNOSIS — E782 Mixed hyperlipidemia: Secondary | ICD-10-CM

## 2023-08-14 DIAGNOSIS — Z0001 Encounter for general adult medical examination with abnormal findings: Secondary | ICD-10-CM | POA: Diagnosis not present

## 2023-08-14 MED ORDER — BREXPIPRAZOLE 3 MG PO TABS: 3.0000 mg | ORAL_TABLET | Freq: Every day | ORAL | 3 refills | Status: AC

## 2023-08-14 MED ORDER — ALBUTEROL SULFATE HFA 108 (90 BASE) MCG/ACT IN AERS: 2.0000 | INHALATION_SPRAY | Freq: Four times a day (QID) | RESPIRATORY_TRACT | 1 refills | Status: AC | PRN

## 2023-08-14 NOTE — Progress Notes (Signed)
BP 120/87   Pulse (!) 115   Ht 5\' 7"  (1.702 m)   Wt 199 lb (90.3 kg)   SpO2 97%   BMI 31.17 kg/m    Subjective:   Patient ID: Valerie Ayala, female    DOB: [birth year], 38 y.o.   MRN: [id number]  HPI: Valerie Ayala is a 38 y.o. female presenting on 08/14/2023 for Medical Management of Chronic Issues (CPE, no pap) and Anxiety   HPI Physical exam Patient is coming in today for physical exam and recheck of chronic medical issues.  She is due for Pap smear since her last abnormal was in 2023.  Hyperlipidemia Patient is coming in for recheck of his hyperlipidemia. The patient is currently taking Lipitor. They deny any issues with myalgias or history of liver damage from it. They deny any focal numbness or weakness or chest pain.   Hypothyroidism recheck Patient is coming in for thyroid recheck today as well. They deny any issues with hair changes or heat or cold problems or diarrhea or constipation. They deny any chest pain or palpitations. They are currently on levothyroxine 200 micrograms   Anxiety and depression recheck Patient is coming in today for anxiety and depression recheck.  She is currently taking Rexulti and Zoloft.  And she feels like she is not doing as well and feeling more depressed.  She has been have a lot of problems with her lupus and rheumatological illnesses and causing a lot of achiness and that is made her feel more down.  She denies any suicidal ideations or thoughts of hurting herself    08/14/2023    2:25 PM 05/01/2023    3:07 PM 04/13/2023   10:43 AM 10/26/2022   11:22 AM 07/27/2022    1:45 PM  Depression screen PHQ 2/9  Decreased Interest 2 2 2 1 1   Down, Depressed, Hopeless 2 1 1 1 1   PHQ - 2 Score 4 3 3 2 2   Altered sleeping 3 3 3 3 3   Tired, decreased energy 3 3 3 3 3   Change in appetite 3 3 3 3 3   Feeling bad or failure about yourself  1 1 1 1 2   Trouble concentrating 2 2 2 1 2   Moving slowly or fidgety/restless 1 2 2 1 2   Suicidal thoughts 0 0 0 0 1   PHQ-9 Score 17 17 17 14 18   Difficult doing work/chores Extremely dIfficult Not difficult at all  Not difficult at all Somewhat difficult      Relevant past medical, surgical, family and social history reviewed and updated as indicated. Interim medical history since our last visit reviewed. Allergies and medications reviewed and updated.  Review of Systems  Constitutional:  Negative for chills and fever.  HENT:  Negative for congestion, ear discharge and ear pain.   Eyes:  Negative for redness and visual disturbance.  Respiratory:  Negative for chest tightness and shortness of breath.   Cardiovascular:  Negative for chest pain and leg swelling.  Genitourinary:  Negative for difficulty urinating and dysuria.  Musculoskeletal:  Positive for myalgias. Negative for back pain and gait problem.  Skin:  Negative for rash.  Neurological:  Negative for dizziness, light-headedness and headaches.  Psychiatric/Behavioral:  Negative for agitation and behavioral problems.   All other systems reviewed and are negative.   Per HPI unless specifically indicated above   Allergies as of 08/14/2023   No Known Allergies      Medication List  Accurate as of August 14, 2023  3:09 PM. If you have any questions, ask your nurse or doctor.          albuterol 108 (90 Base) MCG/ACT inhaler Commonly known as: VENTOLIN HFA Inhale 2 puffs into the lungs every 6 (six) hours as needed. For shortness of breath   atorvastatin 80 MG tablet Commonly known as: LIPITOR Take 1 tablet (80 mg total) by mouth daily.   Brexpiprazole 3 MG Tabs Commonly known as: Rexulti Take 1 tablet (3 mg total) by mouth daily. What changed:  medication strength how much to take Changed by: Elige Radon Breyer Tejera   cholecalciferol 1000 units tablet Commonly known as: VITAMIN D Take 1,000 Units by mouth daily.   ibuprofen 800 MG tablet Commonly known as: ADVIL Take 800 mg by mouth every 6 (six) hours as needed.    levothyroxine 200 MCG tablet Commonly known as: SYNTHROID Take 1 tablet (200 mcg total) by mouth daily.   magic mouthwash (lidocaine, diphenhydrAMINE, alum & mag hydroxide) suspension Swish and spit 5 mLs 4 (four) times daily as needed for mouth pain.   medroxyPROGESTERone 150 MG/ML injection Commonly known as: DEPO-PROVERA Inject 1 mL (150 mg total) into the muscle every 3 (three) months.   pregabalin 100 MG capsule Commonly known as: Lyrica Take 1 capsule (100 mg total) by mouth 2 (two) times daily.   SAPHNELO IV Inject into the vein every 30 (thirty) days.   sertraline 100 MG tablet Commonly known as: ZOLOFT Take 2 tablets (200 mg total) by mouth daily.   varenicline 1 MG tablet Commonly known as: CHANTIX Take 1 tablet (1 mg total) by mouth 2 (two) times daily.         Objective:   BP 120/87   Pulse (!) 115   Ht 5\' 7"  (1.702 m)   Wt 199 lb (90.3 kg)   SpO2 97%   BMI 31.17 kg/m   Wt Readings from Last 3 Encounters:  08/14/23 199 lb (90.3 kg)  08/08/23 189 lb (85.7 kg)  06/15/23 188 lb (85.3 kg)    Physical Exam Vitals and nursing note reviewed.  Constitutional:      General: She is not in acute distress.    Appearance: She is well-developed. She is not diaphoretic.  Eyes:     Conjunctiva/sclera: Conjunctivae normal.  Cardiovascular:     Rate and Rhythm: Normal rate and regular rhythm.     Heart sounds: Normal heart sounds. No murmur heard. Pulmonary:     Effort: Pulmonary effort is normal. No respiratory distress.     Breath sounds: Normal breath sounds. No wheezing.  Musculoskeletal:        General: No swelling or tenderness. Normal range of motion.  Skin:    General: Skin is warm and dry.     Findings: No rash.  Neurological:     Mental Status: She is alert and oriented to person, place, and time.     Coordination: Coordination normal.  Psychiatric:        Mood and Affect: Mood is anxious and depressed.        Behavior: Behavior normal.        Assessment & Plan:   Problem List Items Addressed This Visit       Endocrine   Hypothyroidism   Relevant Orders   TSH     Other   GAD (generalized anxiety disorder)   Relevant Medications   Brexpiprazole (REXULTI) 3 MG TABS   Major depression, recurrent (HCC)  Relevant Medications   Brexpiprazole (REXULTI) 3 MG TABS   Hyperlipidemia   Relevant Orders   Lipid panel   Other Visit Diagnoses       Physical exam    -  Primary       Continue current medicine increase Rexulti to 3 mg daily. Follow up plan: Return in about 3 months (around 11/11/2023), or if symptoms worsen or fail to improve, for Anxiety depression recheck.  Counseling provided for all of the vaccine components Orders Placed This Encounter  Procedures   TSH   Lipid panel    Arville Care, MD [facility] 08/14/2023, 3:09 PM

## 2023-08-15 ENCOUNTER — Telehealth: Payer: Self-pay | Admitting: Pharmacist

## 2023-08-15 ENCOUNTER — Ambulatory Visit: Payer: Medicaid Other

## 2023-08-15 LAB — C3 AND C4
C3 Complement: 172 mg/dL (ref 83–193)
C4 Complement: 19 mg/dL (ref 15–57)

## 2023-08-15 LAB — IRON,TIBC AND FERRITIN PANEL
%SAT: 14 % — ABNORMAL LOW (ref 16–45)
Ferritin: 16 ng/mL (ref 16–154)
Iron: 58 ug/dL (ref 40–190)
TIBC: 408 ug/dL (ref 250–450)

## 2023-08-15 LAB — COMPLETE METABOLIC PANEL WITH GFR
AG Ratio: 1.9 (calc) (ref 1.0–2.5)
ALT: 17 U/L (ref 6–29)
AST: 53 U/L — ABNORMAL HIGH (ref 10–30)
Albumin: 5 g/dL (ref 3.6–5.1)
Alkaline phosphatase (APISO): 80 U/L (ref 31–125)
BUN: 15 mg/dL (ref 7–25)
CO2: 28 mmol/L (ref 20–32)
Calcium: 8.5 mg/dL — ABNORMAL LOW (ref 8.6–10.2)
Chloride: 99 mmol/L (ref 98–110)
Creat: 0.93 mg/dL (ref 0.50–0.97)
Globulin: 2.7 g/dL (ref 1.9–3.7)
Glucose, Bld: 89 mg/dL (ref 65–99)
Potassium: 3.4 mmol/L — ABNORMAL LOW (ref 3.5–5.3)
Sodium: 139 mmol/L (ref 135–146)
Total Bilirubin: 0.8 mg/dL (ref 0.2–1.2)
Total Protein: 7.7 g/dL (ref 6.1–8.1)
eGFR: 80 mL/min/{1.73_m2} (ref >=60)

## 2023-08-15 LAB — CBC WITH DIFFERENTIAL/PLATELET
Absolute Lymphocytes: 3964 {cells}/uL — ABNORMAL HIGH (ref 850–3900)
Absolute Monocytes: 562 {cells}/uL (ref 200–950)
Basophils Absolute: 127 {cells}/uL (ref 0–200)
Basophils Relative: 1.2 %
Eosinophils Absolute: 286 {cells}/uL (ref 15–500)
Eosinophils Relative: 2.7 %
HCT: 41.9 % (ref 35.0–45.0)
Hemoglobin: 13.9 g/dL (ref 11.7–15.5)
MCH: 28.4 pg (ref 27.0–33.0)
MCHC: 33.2 g/dL (ref 32.0–36.0)
MCV: 85.5 fL (ref 80.0–100.0)
MPV: 10.4 fL (ref 7.5–12.5)
Monocytes Relative: 5.3 %
Neutro Abs: 5660 {cells}/uL (ref 1500–7800)
Neutrophils Relative %: 53.4 %
Platelets: 440 10*3/uL — ABNORMAL HIGH (ref 140–400)
RBC: 4.9 10*6/uL (ref 3.80–5.10)
RDW: 13.5 % (ref 11.0–15.0)
Total Lymphocyte: 37.4 %
WBC: 10.6 10*3/uL (ref 3.8–10.8)

## 2023-08-15 LAB — LIPID PANEL
Chol/HDL Ratio: 4.6 {ratio} — ABNORMAL HIGH (ref 0.0–4.4)
Cholesterol, Total: 157 mg/dL (ref 100–199)
HDL: 34 mg/dL — ABNORMAL LOW (ref >39)
LDL Chol Calc (NIH): 82 mg/dL (ref 0–99)
Triglycerides: 248 mg/dL — ABNORMAL HIGH (ref 0–149)
VLDL Cholesterol Cal: 41 mg/dL — ABNORMAL HIGH (ref 5–40)

## 2023-08-15 LAB — ANTI-DNA ANTIBODY, DOUBLE-STRANDED: ds DNA Ab: 23 [IU]/mL — ABNORMAL HIGH

## 2023-08-15 LAB — VITAMIN D 25 HYDROXY (VIT D DEFICIENCY, FRACTURES): Vit D, 25-Hydroxy: 45 ng/mL (ref 30–100)

## 2023-08-15 LAB — VITAMIN B2(RIBOFLAVIN),PLASMA: Vitamin B2(Riboflavin),Plasma: <5 nmol/L — ABNORMAL LOW (ref 6.2–39.0)

## 2023-08-15 LAB — TSH: TSH: 4.96 u[IU]/mL — ABNORMAL HIGH (ref 0.450–4.500)

## 2023-08-15 LAB — ZINC: Zinc: 71 ug/dL (ref 60–130)

## 2023-08-15 LAB — SEDIMENTATION RATE: Sed Rate: 22 mm/h — ABNORMAL HIGH (ref 0–20)

## 2023-08-15 NOTE — Telephone Encounter (Signed)
Received fax from [facility] regarding Saphnelo IV [phone]) infusion that patient received on 08/14/2023. Dose: 300mg  Labs were not drawn.  Patient tolerated infusion without complications.  Changes/concerns since last visit: none  Next Saphnelo infusion scheduled for 09/12/2023  Chesley Mires, PharmD, MPH, BCPS, CPP Clinical Pharmacist (Rheumatology and Pulmonology)

## 2023-08-17 ENCOUNTER — Encounter: Payer: Self-pay | Admitting: Family Medicine

## 2023-08-17 ENCOUNTER — Encounter: Payer: Medicaid Other | Admitting: Physical Medicine & Rehabilitation

## 2023-08-22 ENCOUNTER — Other Ambulatory Visit: Payer: Self-pay

## 2023-08-22 MED ORDER — LEVOTHYROXINE SODIUM 25 MCG PO TABS: 12.5000 ug | ORAL_TABLET | Freq: Every day | ORAL | 1 refills | Status: DC

## 2023-08-24 ENCOUNTER — Other Ambulatory Visit: Payer: Self-pay | Admitting: Family Medicine

## 2023-08-24 DIAGNOSIS — F331 Major depressive disorder, recurrent, moderate: Secondary | ICD-10-CM

## 2023-08-24 DIAGNOSIS — F411 Generalized anxiety disorder: Secondary | ICD-10-CM

## 2023-08-29 ENCOUNTER — Ambulatory Visit: Payer: Medicaid Other

## 2023-08-30 ENCOUNTER — Ambulatory Visit (INDEPENDENT_AMBULATORY_CARE_PROVIDER_SITE_OTHER)

## 2023-08-30 DIAGNOSIS — Z308 Encounter for other contraceptive management: Secondary | ICD-10-CM | POA: Diagnosis not present

## 2023-08-30 LAB — PREGNANCY, URINE: Preg Test, Ur: NEGATIVE

## 2023-08-30 NOTE — Progress Notes (Signed)
 Urine pregnancy today since pt is over due for injection. Results are negative.  Depo injection given in left upper outer quadrant without difficulty.  Next depo injection scheduled for 5/21

## 2023-09-11 ENCOUNTER — Encounter: Payer: Self-pay | Admitting: Family Medicine

## 2023-09-12 ENCOUNTER — Ambulatory Visit: Payer: Medicaid Other

## 2023-09-20 DIAGNOSIS — M329 Systemic lupus erythematosus, unspecified: Secondary | ICD-10-CM | POA: Diagnosis not present

## 2023-09-21 ENCOUNTER — Encounter: Payer: Medicaid Other | Attending: Physical Medicine & Rehabilitation | Admitting: Physical Medicine & Rehabilitation

## 2023-09-21 ENCOUNTER — Telehealth: Payer: Self-pay | Admitting: Pharmacist

## 2023-09-21 DIAGNOSIS — M797 Fibromyalgia: Secondary | ICD-10-CM | POA: Insufficient documentation

## 2023-09-21 DIAGNOSIS — M329 Systemic lupus erythematosus, unspecified: Secondary | ICD-10-CM | POA: Insufficient documentation

## 2023-09-21 DIAGNOSIS — F32A Depression, unspecified: Secondary | ICD-10-CM | POA: Insufficient documentation

## 2023-09-21 NOTE — Telephone Encounter (Signed)
 Received fax from [facility] regarding Saphnelo IV [phone]) infusion that patient received on 09/20/23. Dose: 300mg  Labs were not drawn.  Patient tolerated infusion without complications.  Changes/concerns since last visit: none  Next Saphnelo infusion scheduled for 10/20/23  Chesley Mires, PharmD, MPH, BCPS, CPP Clinical Pharmacist (Rheumatology and Pulmonology)

## 2023-10-26 DIAGNOSIS — M329 Systemic lupus erythematosus, unspecified: Secondary | ICD-10-CM | POA: Diagnosis not present

## 2023-10-26 NOTE — Progress Notes (Deleted)
 Office Visit Note  Patient: Valerie Ayala             Date of Birth: February 03, 1986           MRN: [id number]             PCP: Dettinger, Lucio Sabin, MD Referring: Dettinger, Lucio Sabin, MD Visit Date: 11/07/2023   Subjective:  No chief complaint on file.   History of Present Illness: Valerie Ayala is a 38 y.o. female here for follow up for systemic lupus currently on treatment with Saphnelo  infusions 300 mg IV monthly.    Previous HPI 08/08/2023 Valerie Ayala is a 38 y.o. female here for follow up for systemic lupus currently on treatment with Saphnelo  infusions 300 mg IV monthly.     Over the past two to three months, she has experienced worsening symptoms, including persistent flu-like symptoms, fatigue, and generalized pain. Despite recent lab results from November showing improvement in lupus markers, she feels her condition is deteriorating.   She describes significant leg swelling, particularly in the left leg. This problem is recurrent, had been worse in the past but trending worse now. The swelling worsens with physical activity, such as cleaning or walking, and is accompanied by pain. She takes vitamin D  daily but not calcium  supplements.   Severe fatigue and muscle weakness are present, with minimal physical activity, like vacuuming, resulting in prolonged pain and inability to raise her arms. Her muscles feel depleted, and she struggles with walking, often needing support to prevent falling.   She experiences frequent urination without pain or blood, feeling the need to urinate constantly with little output each time. No symptoms typical of a bladder infection are present.   She has a history of constipation but experienced diarrhea following a recent flu episode. No nausea is present, but she notes a generally dry mouth.   Persistent itching, particularly after leg swelling subsides, leads to skin damage from scratching. 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   She has been taking Lyrica  for pain management but finds it ineffective. Recently increased to 200 mg BID. She previously    She mentions a history of shingles and has received the first dose of the Shingrix  vaccine, with the second dose planned soon.     Previous HPI 05/08/2023 Valerie Ayala is a 38 y.o. female here for follow up  for systemic lupus currently on treatment with Saphnelo  infusions monthly.  She saw Dr. Rayleen Cal for management of chronic fibromyalgia and myofascial pain most recently addition of Lyrica  75 mg.  Overall she feels symptoms are generally in exacerbation.  She had a very stressful event when her son was in the hospital as a passenger in motor vehicle collision.  For several months has increased symptoms of concentration and short-term memory difficulty, unintentional weight gain, and fatigue.  Is also experiencing urinary frequency with occasional stress incontinence.  Occasional incontinence has been the case for years but the urgency is just a recent development.  She describes it as feeling "like she has a UTI but without any pain."   Previous HPI 02/02/2023 Valerie Ayala is a 38 y.o. female here for follow up for systemic lupus currently on treatment with Saphnelo  infusions monthly first dose on March 6 second infusion slightly delayed to April 16 for perioperative management.  Overall she feels better than earlier in this year but she thinks there is less benefit after  the more recent Saphnelo  infusions compared to after her first 2 drug treatments.  She had 1 episode of extremely painful oral ulcers since the last visit which were a new symptom for her.  Leg swelling has been intermittent not too severe and without associated skin changes.  Worse problems currently just generalized fatigue and bodyaches with limited exertion tolerance.   Previous HPI 11/04/2022 Valerie Ayala is a 38 y.o. female here for follow up for systemic lupus  currently on treatment with Saphnelo  infusions monthly first dose on March 6 second infusion slightly delayed to April 16 for perioperative management.  She reports noticing a definite change after infusion with improvement in her skin rashes swelling though she remains very fatigued.  Reports some waning of the benefit she noticed during the last week or 2 prior to repeat infusion.  But currently doing still doing pretty well from treatment on the 16th of last month.  She has questions about naloxone that she read read about regarding management of her fibromyalgia due to persistent body aches and fatigue.   Previous HPI 08/05/22 Valerie Ayala is a 38 y.o. female here for follow up for systemic lupus currently off treatment due to worsening in LFTs and lack of symptom response. She continues to have joint pain and swelling in hands and feet and skin rashes.  Raynaud's symptoms are worse during wintertime so far but not having any blistering or skin peeling changes.  Not much swelling back in her legs and no new mouth or nose ulcers.  Very severe fatigue is her most problematic symptom feels like she can sleep for 12 hours or more without having improvement in energy she has been referred for sleep study.   Previous HPI 06/02/22  Valerie Ayala is a 38 y.o. female here for follow up for SLE after last visit discontinued benlysta  due to loss of efficacy and starting methotrexate  15 mg PO weekly and folic acid  1 mg daily and continuing HCQ 300 mg daily. Also 2 week prednisone  taper for general exacerbation of symptoms at that time likely triggered by preceding URI. Since then she continues to feel considerable joint pain and stiffness. Most affected in her bilateral shoulders and knees but also with hand pain limiting some activities such as coloring. Symptoms are worst overnight and for a few hours each morning. She had at least one episode of bilateral leg swelling lasting several days.    Previous  HPI 03/22/22 Valerie Ayala is a 38 y.o. female here for follow up for SLE on HCQ 300 mg daily and benlysta  200 mg [place] weekly. She is currently feeling worse pretty much all over. Especially joint pains and fatigue and increased skin rashes. She had an upper respiratory illness recently she recovered since about a week ago but has other symptoms are all worse since then. Leg swelling is doing okay and no new mouth and nose ulcers.   Previous HPI 10/14/2021 Valerie Ayala is a 38 y.o. female here for follow up for SLE on HCQ 300 mg daily and Benlysta  200 mg [place] weekly. At last visit significant worsening in lower extremity swelling but normal lab markers and negative for proteinuria.  Since the last visit he continues to do very poorly.  She has ongoing leg swelling itching sometimes there is redness discoloration.  Newer problem is feeling some proximal leg weakness occasional sense of her legs giving out or locking up when walking.  She is extremely fatigued almost every day.  Sometimes she cannot even get out of bed for hours and her mood is very poor due to decreased ability to do anything.  She is also been noticing some palpitations and lightheadedness getting short of breath with very brief or mild exertion.     Previous HPI 06/15/21 Valerie Ayala is a 38 y.o. female here for follow up for SLE on HCQ 300 mg daily and Benlysta  200 mg [place] weekly.  She was doing very well after our last visit until about a month ago now feels everything is horrible.  She has significant bilateral leg swelling with pitting edema developing increased tenderness and painful burning sensation with discolorations and skin changes in both legs.  She feels very fatigued.  Some skin dryness and peeling on the hands but no significant erythematous rash outside of the lower legs.  She recently also had flu with generalized body pains prescribed very large anterior cervical adenopathy.  This improved though she still has a residual ulcer in  the mouth. Labs earlier this month showing very low TSH level with recent slight downward adjustment of her thyroid  replacement.  She quit smoking with use of Chantix .   Previous HPI 12/08/20 Valerie Ayala is a 38 y.o. female here for follow up for systemic lupus on hydroxychloroquine  300 mg p.o. daily. No signfiicant improvement since starting hydroxychloroquine  8 weeks ago.  She has not yet seen ophthalmology for retinal exam but plans to do so. Leg swelling continues to come and go, residual erythema and pain and burning sensations.  She continues to feel extremely fatigued as the most noticeable symptom.  She is also had a few mouth sores headaches and swollen glands but also describes the ongoing leg swelling sometimes with pain and discoloration.  Currently her left distal leg is most affected today.   Lupus manifestations Fatigue Arthralgias Photosensitive rash Oral ulcers Raynaud's   Lupus serology ANA 1:80 speckled dsDNA 49 Complement C3, C4 wnl Vit D 19   DMARD Hx Saphnelo  08/2022-current   No Rheumatology ROS completed.   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   Fibromyalgia 08/08/2022   ASCUS with positive high risk HPV cervical 09/15/2021   History of abnormal cervical Pap smear 09/08/2021   Swelling of lower extremity 12/08/2020   High risk medication use 12/08/2020   Hyperlipidemia 12/03/2020   Hypocalcemia 09/07/2020   Vitamin D  deficiency 09/07/2020   Systemic lupus (HCC) 08/21/2020   Dry mouth 08/21/2020   Tobacco use disorder 08/21/2020   Arthralgia 08/21/2020   Raynaud's phenomenon 08/21/2020   Major depression, recurrent (HCC) 04/26/2018   Adult ADHD 12/16/2014   Asthma 05/07/2013   Hypothyroidism 11/26/2012   GAD (generalized anxiety disorder) 11/26/2012    Past Medical History:  Diagnosis Date   Anxiety    Takes xanax  twice daily   ASCUS with positive high risk HPV cervical 09/15/2021    08/2021 +HPV 16/0ther ASCUS, needs colpo, immediate risk CIN3+ 9%   Asthma    Chronic headaches    "multiple times a day; they say it's related to thyroid "   Complication of anesthesia    Dysrhythmia    tachycardia   Fibromyalgia    Grave's disease    H/O seasonal allergies    Headache(784.0)    most days   Pneumonia 11/02/2011   "years ago"   PONV (postoperative nausea and vomiting)    Systemic lupus erythematosus (HCC)    Thyroid  disease  Family History  Problem Relation Age of Onset   Hypertension Mother    Hyperlipidemia Mother    Diabetes Mother    Sleep apnea Mother    Hypertension Father    Hyperlipidemia Father    Hypothyroidism Father    Healthy Sister    Anesthesia problems Neg Hx    Hypotension Neg Hx    Malignant hyperthermia Neg Hx    Pseudochol deficiency Neg Hx    Past Surgical History:  Procedure Laterality Date   teeth pulled     back teeth   THYROIDECTOMY  11/02/2011   Procedure: THYROIDECTOMY;  Surgeon: Janita Mellow, MD;  Location: [facility];  Service: ENT;  Laterality: Bilateral;  TOTAL THYROIDECTOMY   TOTAL THYROIDECTOMY  11/02/2011   WISDOM TOOTH EXTRACTION  06/27/2002   Social History   Social History Narrative   Not on file   Immunization History  Administered Date(s) Administered   DTaP 08/24/1984, 11/05/1984, 12/31/1984, 12/24/1985   Hepatitis B 04/26/1996, 05/31/1996, 08/30/1996   IPV 08/24/1984, 11/05/1984, 12/24/1985   Influenza, Seasonal, Injecte, Preservative Fre 05/18/2023   Influenza,inj,Quad PF,6+ Mos 05/25/2020, 05/27/2021, 03/24/2022   Influenza-Unspecified 06/06/2011, 04/25/2012   MMR 12/24/1985   PPD Test 03/16/2015, 03/23/2015   Pneumococcal Polysaccharide-23 11/03/2011   Tdap 12/10/2010, 07/28/2021   Zoster Recombinant(Shingrix ) 05/01/2023, 08/14/2023     Objective: Vital Signs: There were no vitals taken for this visit.   Physical Exam   Musculoskeletal Exam: ***  CDAI Exam: CDAI Score: -- Patient Global: --;  Provider Global: -- Swollen: --; Tender: -- Joint Exam 11/07/2023   No joint exam has been documented for this visit   There is currently no information documented on the homunculus. Go to the Rheumatology activity and complete the homunculus joint exam.  Investigation: No additional findings.  Imaging: No results found.  Recent Labs: Lab Results  Component Value Date   WBC 10.6 08/08/2023   HGB 13.9 08/08/2023   PLT 440 (H) 08/08/2023   NA 139 08/08/2023   K 3.4 (L) 08/08/2023   CL 99 08/08/2023   CO2 28 08/08/2023   GLUCOSE 89 08/08/2023   BUN 15 08/08/2023   CREATININE 0.93 08/08/2023   BILITOT 0.8 08/08/2023   ALKPHOS 76 01/30/2023   AST 53 (H) 08/08/2023   ALT 17 08/08/2023   PROT 7.7 08/08/2023   ALBUMIN 4.4 01/30/2023   CALCIUM  8.5 (L) 08/08/2023   GFRAA 80 02/27/2020   QFTBGOLDPLUS NEGATIVE 12/08/2020    Speciality Comments: No specialty comments available.  Procedures:  No procedures performed Allergies: Patient has no known allergies.   Assessment / Plan:     Visit Diagnoses: No diagnosis found.  ***  Orders: No orders of the defined types were placed in this encounter.  No orders of the defined types were placed in this encounter.    Follow-Up Instructions: No follow-ups on file.   Glena Landau, RT  Note - This record has been created using [employer].  Chart creation errors have been sought, but may not always  have been located. Such creation errors do not reflect on  the standard of medical care.

## 2023-10-30 ENCOUNTER — Telehealth: Payer: Self-pay | Admitting: Pharmacist

## 2023-10-30 NOTE — Telephone Encounter (Signed)
 Received fax from [facility] regarding Saphnelo  IV ([id number]) infusion that patient received on 10/26/23. Dose: 300mg  Labs were drawn: BC/CMO/C3-C4 complement  Patient tolerated infusion without complications.  Changes/concerns since last visit: none  Next Saphnelo  infusion scheduled for 11/23/23  Geraldene Kleine, PharmD, MPH, BCPS, CPP Clinical Pharmacist (Rheumatology and Pulmonology)

## 2023-11-03 ENCOUNTER — Encounter (HOSPITAL_COMMUNITY): Payer: Self-pay

## 2023-11-07 ENCOUNTER — Ambulatory Visit: Payer: Medicaid Other | Admitting: Internal Medicine

## 2023-11-07 DIAGNOSIS — E559 Vitamin D deficiency, unspecified: Secondary | ICD-10-CM

## 2023-11-07 DIAGNOSIS — K130 Diseases of lips: Secondary | ICD-10-CM

## 2023-11-07 DIAGNOSIS — M329 Systemic lupus erythematosus, unspecified: Secondary | ICD-10-CM

## 2023-11-07 DIAGNOSIS — M7989 Other specified soft tissue disorders: Secondary | ICD-10-CM

## 2023-11-07 DIAGNOSIS — Z79899 Other long term (current) drug therapy: Secondary | ICD-10-CM

## 2023-11-07 DIAGNOSIS — M797 Fibromyalgia: Secondary | ICD-10-CM

## 2023-11-10 ENCOUNTER — Ambulatory Visit: Payer: Medicaid Other | Admitting: Family Medicine

## 2023-11-10 ENCOUNTER — Ambulatory Visit: Payer: Medicaid Other

## 2023-11-10 ENCOUNTER — Encounter: Payer: Self-pay | Admitting: Family Medicine

## 2023-11-10 VITALS — BP 124/73 | HR 90 | Temp 98.0°F | Ht 67.0 in | Wt 203.0 lb

## 2023-11-10 DIAGNOSIS — E782 Mixed hyperlipidemia: Secondary | ICD-10-CM | POA: Diagnosis not present

## 2023-11-10 DIAGNOSIS — E890 Postprocedural hypothyroidism: Secondary | ICD-10-CM | POA: Diagnosis not present

## 2023-11-10 DIAGNOSIS — F331 Major depressive disorder, recurrent, moderate: Secondary | ICD-10-CM

## 2023-11-10 DIAGNOSIS — R739 Hyperglycemia, unspecified: Secondary | ICD-10-CM

## 2023-11-10 DIAGNOSIS — Z23 Encounter for immunization: Secondary | ICD-10-CM | POA: Diagnosis not present

## 2023-11-10 DIAGNOSIS — F411 Generalized anxiety disorder: Secondary | ICD-10-CM | POA: Diagnosis not present

## 2023-11-10 LAB — BAYER DCA HB A1C WAIVED: HB A1C (BAYER DCA - WAIVED): 5.5 % (ref 4.8–5.6)

## 2023-11-10 MED ORDER — LEVOTHYROXINE SODIUM 200 MCG PO TABS: 200.0000 ug | ORAL_TABLET | Freq: Every day | ORAL | 1 refills | Status: DC

## 2023-11-10 MED ORDER — LEVOTHYROXINE SODIUM 25 MCG PO TABS: 12.5000 ug | ORAL_TABLET | Freq: Every day | ORAL | 1 refills | Status: DC

## 2023-11-10 NOTE — Progress Notes (Signed)
 BP 124/73   Pulse 90   Temp 98 F (36.7 C)   Ht 5\' 7"  (1.702 m)   Wt 203 lb (92.1 kg)   SpO2 99%   BMI 31.79 kg/m    Subjective:   Patient ID: Valerie Ayala, female    DOB: 10/09/1985, 38 y.o.   MRN: [id number]  HPI: Valerie Ayala is a 38 y.o. female presenting on 11/10/2023 for Medical Management of Chronic Issues and Anxiety   HPI Anxiety recheck Patient is currently taking Rexulti  and Zoloft  for anxiety and depression.  Patient feels that her mood patient is down.  She is thinks her anxiety is doing okay but just generally healthwise she feels like her motivation and energy and her blood sugars have been up at home and that has been affecting her overall that she has been feeling.  She denies any suicidal ideation or thoughts of hurting herself    11/10/2023    3:42 PM 08/14/2023    2:25 PM 05/01/2023    3:07 PM 04/13/2023   10:43 AM 10/26/2022   11:22 AM  Depression screen PHQ 2/9  Decreased Interest 2 2 2 2 1   Down, Depressed, Hopeless 2 2 1 1 1   PHQ - 2 Score 4 4 3 3 2   Altered sleeping 2 3 3 3 3   Tired, decreased energy 2 3 3 3 3   Change in appetite 2 3 3 3 3   Feeling bad or failure about yourself  1 1 1 1 1   Trouble concentrating 1 2 2 2 1   Moving slowly or fidgety/restless 1 1 2 2 1   Suicidal thoughts 0 0 0 0 0  PHQ-9 Score 13 17 17 17 14   Difficult doing work/chores Very difficult Extremely dIfficult Not difficult at all  Not difficult at all    Hypothyroidism recheck Patient is coming in for thyroid  recheck today as well. They deny any issues with hair changes or heat or cold problems or diarrhea or constipation. They deny any chest pain or palpitations. They are currently on levothyroxine  12.5 mcg, supposed to be taking 212.5 micrograms   Hyperlipidemia Patient is coming in for recheck of his hyperlipidemia. The patient is currently taking atorvastatin . They deny any issues with myalgias or history of liver damage from it. They deny any focal numbness or weakness or  chest pain.   Relevant past medical, surgical, family and social history reviewed and updated as indicated. Interim medical history since our last visit reviewed. Allergies and medications reviewed and updated.  Review of Systems  Constitutional:  Negative for chills and fever.  Eyes:  Negative for visual disturbance.  Respiratory:  Negative for chest tightness and shortness of breath.   Cardiovascular:  Negative for chest pain and leg swelling.  Musculoskeletal:  Negative for back pain and gait problem.  Skin:  Negative for rash.  Neurological:  Negative for dizziness, light-headedness and headaches.  Psychiatric/Behavioral:  Negative for agitation and behavioral problems.   All other systems reviewed and are negative.   Per HPI unless specifically indicated above   Allergies as of 11/10/2023   No Known Allergies      Medication List        Accurate as of Nov 10, 2023  3:58 PM. If you have any questions, ask your nurse or doctor.          albuterol  108 (90 Base) MCG/ACT inhaler Commonly known as: VENTOLIN  HFA Inhale 2 puffs into the lungs every 6 (six) hours as  needed. For shortness of breath   atorvastatin  80 MG tablet Commonly known as: LIPITOR Take 1 tablet (80 mg total) by mouth daily.   Brexpiprazole  3 MG Tabs Commonly known as: Rexulti  Take 1 tablet (3 mg total) by mouth daily.   cholecalciferol 1000 units tablet Commonly known as: VITAMIN D  Take 1,000 Units by mouth daily.   ibuprofen  800 MG tablet Commonly known as: ADVIL  Take 800 mg by mouth every 6 (six) hours as needed.   levothyroxine  25 MCG tablet Commonly known as: SYNTHROID  Take 0.5 tablets (12.5 mcg total) by mouth daily. Take 1/2 tablet daily What changed: Another medication with the same name was changed. Make sure you understand how and when to take each. Changed by: Lucio Sabin Ardis Lawley   levothyroxine  200 MCG tablet Commonly known as: SYNTHROID  Take 1 tablet (200 mcg total) by mouth  daily. Take 200+ the 12-1/2 mcg for a total of 212.5 mcg What changed: additional instructions Changed by: Lucio Sabin Katheleen Stella   magic mouthwash (lidocaine , diphenhydrAMINE , alum & mag hydroxide) suspension Swish and spit 5 mLs 4 (four) times daily as needed for mouth pain.   medroxyPROGESTERone  150 MG/ML injection Commonly known as: DEPO-PROVERA  Inject 1 mL (150 mg total) into the muscle every 3 (three) months.   pregabalin  100 MG capsule Commonly known as: Lyrica  Take 1 capsule (100 mg total) by mouth 2 (two) times daily.   SAPHNELO  IV Inject into the vein every 30 (thirty) days.   sertraline  100 MG tablet Commonly known as: ZOLOFT  Take 2 tablets (200 mg total) by mouth daily.   varenicline  1 MG tablet Commonly known as: CHANTIX  Take 1 tablet (1 mg total) by mouth 2 (two) times daily.         Objective:   BP 124/73   Pulse 90   Temp 98 F (36.7 C)   Ht 5\' 7"  (1.702 m)   Wt 203 lb (92.1 kg)   SpO2 99%   BMI 31.79 kg/m   Wt Readings from Last 3 Encounters:  11/10/23 203 lb (92.1 kg)  08/14/23 199 lb (90.3 kg)  08/08/23 189 lb (85.7 kg)    Physical Exam Vitals and nursing note reviewed.  Constitutional:      General: She is not in acute distress.    Appearance: She is well-developed. She is not diaphoretic.  Eyes:     Conjunctiva/sclera: Conjunctivae normal.  Cardiovascular:     Rate and Rhythm: Normal rate and regular rhythm.     Heart sounds: Normal heart sounds. No murmur heard. Pulmonary:     Effort: Pulmonary effort is normal. No respiratory distress.     Breath sounds: Normal breath sounds. No wheezing.  Musculoskeletal:        General: No swelling.  Skin:    General: Skin is warm and dry.     Findings: No rash.  Neurological:     Mental Status: She is alert and oriented to person, place, and time.     Coordination: Coordination normal.  Psychiatric:        Behavior: Behavior normal.       Assessment & Plan:   Problem List Items  Addressed This Visit       Endocrine   Hypothyroidism - Primary   Relevant Medications   levothyroxine  (SYNTHROID ) 25 MCG tablet   levothyroxine  (SYNTHROID ) 200 MCG tablet   Other Relevant Orders   TSH     Other   GAD (generalized anxiety disorder)   Relevant Orders   CBC  with Differential/Platelet   Major depression, recurrent (HCC)   Relevant Orders   CBC with Differential/Platelet   CMP14+EGFR   Hyperlipidemia   Relevant Orders   CBC with Differential/Platelet   CMP14+EGFR   Lipid panel   Other Visit Diagnoses       Elevated blood sugar       Relevant Orders   Bayer DCA Hb A1c Waived       Will check blood work and thyroid  levels, will resume her back on the higher dose of 212.5 mcg, sent those to the pharmacy.  Wanted to come back in 1 to 2 months to recheck her thyroid . Follow up plan: Return if symptoms worsen or fail to improve, for 69-month thyroid  recheck.  Counseling provided for all of the vaccine components Orders Placed This Encounter  Procedures   CBC with Differential/Platelet   CMP14+EGFR   Lipid panel   TSH   Bayer DCA Hb A1c Waived    Jolyne Needs, MD [facility] 11/10/2023, 3:58 PM

## 2023-11-11 ENCOUNTER — Other Ambulatory Visit: Payer: Self-pay | Admitting: Family Medicine

## 2023-11-11 DIAGNOSIS — Z3009 Encounter for other general counseling and advice on contraception: Secondary | ICD-10-CM

## 2023-11-11 LAB — CBC WITH DIFFERENTIAL/PLATELET
Basophils Absolute: 0.2 10*3/uL (ref 0.0–0.2)
Basos: 1 %
EOS (ABSOLUTE): 0.4 10*3/uL (ref 0.0–0.4)
Eos: 4 %
Hematocrit: 40.1 % (ref 34.0–46.6)
Hemoglobin: 13.2 g/dL (ref 11.1–15.9)
Immature Grans (Abs): 0.1 10*3/uL (ref 0.0–0.1)
Immature Granulocytes: 1 %
Lymphocytes Absolute: 5.5 10*3/uL — ABNORMAL HIGH (ref 0.7–3.1)
Lymphs: 48 %
MCH: 28.5 pg (ref 26.6–33.0)
MCHC: 32.9 g/dL (ref 31.5–35.7)
MCV: 87 fL (ref 79–97)
Monocytes Absolute: 0.5 10*3/uL (ref 0.1–0.9)
Monocytes: 4 %
Neutrophils Absolute: 4.7 10*3/uL (ref 1.4–7.0)
Neutrophils: 42 %
Platelets: 323 10*3/uL (ref 150–450)
RBC: 4.63 x10E6/uL (ref 3.77–5.28)
RDW: 17 % — ABNORMAL HIGH (ref 11.7–15.4)
WBC: 11.3 10*3/uL — ABNORMAL HIGH (ref 3.4–10.8)

## 2023-11-11 LAB — LIPID PANEL
Chol/HDL Ratio: 10.2 ratio — ABNORMAL HIGH (ref 0.0–4.4)
Cholesterol, Total: 335 mg/dL — ABNORMAL HIGH (ref 100–199)
HDL: 33 mg/dL — ABNORMAL LOW (ref >39)
LDL Chol Calc (NIH): 259 mg/dL — ABNORMAL HIGH (ref 0–99)
Triglycerides: 209 mg/dL — ABNORMAL HIGH (ref 0–149)
VLDL Cholesterol Cal: 43 mg/dL — ABNORMAL HIGH (ref 5–40)

## 2023-11-11 LAB — CMP14+EGFR
ALT: 15 IU/L (ref 0–32)
AST: 29 IU/L (ref 0–40)
Albumin: 4.9 g/dL (ref 3.9–4.9)
Alkaline Phosphatase: 93 IU/L (ref 44–121)
BUN/Creatinine Ratio: 9 (ref 9–23)
BUN: 11 mg/dL (ref 6–20)
Bilirubin Total: 0.7 mg/dL (ref 0.0–1.2)
CO2: 24 mmol/L (ref 20–29)
Calcium: 7.9 mg/dL — ABNORMAL LOW (ref 8.7–10.2)
Chloride: 98 mmol/L (ref 96–106)
Creatinine, Ser: 1.27 mg/dL — ABNORMAL HIGH (ref 0.57–1.00)
Globulin, Total: 2.6 g/dL (ref 1.5–4.5)
Glucose: 90 mg/dL (ref 70–99)
Potassium: 3.6 mmol/L (ref 3.5–5.2)
Sodium: 139 mmol/L (ref 134–144)
Total Protein: 7.5 g/dL (ref 6.0–8.5)
eGFR: 55 mL/min/{1.73_m2} — ABNORMAL LOW (ref >59)

## 2023-11-11 LAB — TSH: TSH: 266 u[IU]/mL — ABNORMAL HIGH (ref 0.450–4.500)

## 2023-11-13 DIAGNOSIS — F411 Generalized anxiety disorder: Secondary | ICD-10-CM | POA: Diagnosis not present

## 2023-11-13 DIAGNOSIS — R739 Hyperglycemia, unspecified: Secondary | ICD-10-CM | POA: Diagnosis not present

## 2023-11-13 DIAGNOSIS — Z23 Encounter for immunization: Secondary | ICD-10-CM

## 2023-11-13 DIAGNOSIS — F331 Major depressive disorder, recurrent, moderate: Secondary | ICD-10-CM | POA: Diagnosis not present

## 2023-11-13 DIAGNOSIS — E782 Mixed hyperlipidemia: Secondary | ICD-10-CM | POA: Diagnosis not present

## 2023-11-13 DIAGNOSIS — E890 Postprocedural hypothyroidism: Secondary | ICD-10-CM | POA: Diagnosis not present

## 2023-11-15 ENCOUNTER — Ambulatory Visit

## 2023-11-15 ENCOUNTER — Ambulatory Visit: Payer: Self-pay | Admitting: Family Medicine

## 2023-11-17 ENCOUNTER — Ambulatory Visit

## 2023-11-23 DIAGNOSIS — M329 Systemic lupus erythematosus, unspecified: Secondary | ICD-10-CM | POA: Diagnosis not present

## 2023-11-24 ENCOUNTER — Telehealth: Payer: Self-pay | Admitting: Pharmacist

## 2023-11-24 NOTE — Telephone Encounter (Signed)
 Received fax from [facility] regarding Saphnelo  IV [phone])  infusion that patient received on 11/23/23. Dose: 300mg  Labs were not drawn.  Patient tolerated infusion without complications.  Changes/concerns since last visit: none  Next Saphnelo  infusion scheduled for 12/21/23  Geraldene Kleine, PharmD, MPH, BCPS, CPP Clinical Pharmacist (Rheumatology and Pulmonology)

## 2023-11-27 ENCOUNTER — Ambulatory Visit (INDEPENDENT_AMBULATORY_CARE_PROVIDER_SITE_OTHER): Admitting: *Deleted

## 2023-11-27 DIAGNOSIS — Z308 Encounter for other contraceptive management: Secondary | ICD-10-CM | POA: Diagnosis not present

## 2023-11-27 NOTE — Progress Notes (Signed)
Patient is in office today for a nurse visit for Birth Control Injection. Patient Injection was given in the  Right upper quad. gluteus. Patient tolerated injection well.

## 2023-12-14 NOTE — Progress Notes (Signed)
 Office Visit Note  Patient: Valerie Ayala             Date of Birth: 1985-12-17           MRN: [id number]             PCP: Dettinger, Fonda LABOR, MD Referring: Dettinger, Fonda LABOR, MD Visit Date: 12/27/2023   Subjective:  Follow-up   History of Present Illness:  Discussed the use of AI scribe software for clinical note transcription with the patient, who gave verbal consent to proceed.  History of Present Illness   Valerie Ayala is a 38 y.o. female here for follow up for systemic lupus currently on treatment with Saphnelo  infusions 300 mg IV monthly.    She has been experiencing persistent and worsening angular cheilitis. Recent laboratory results revealed a low level of vitamin B2 (riboflavin). She is borderline iron deficient as well.  She has been receiving infusions for lupus, which have led to partial improvement in her lab markers, specifically a decrease in her double-stranded DNA titer. Despite this, she does not feel significantly better post-infusions. She experiences unpredictable leg swelling, which has been less frequent recently. She has gained weight, although she reports eating infrequently, sometimes only once a day, and has not noticed a change in her activity level.  This morning, she experienced chest pain for the first time, located centrally, lasting about thirty minutes. She initially thought she was having a heart attack. She also reports swelling in her hands, making it difficult to remove her rings. She experiences occasional acid reflux, for which she uses Tums, but does not take regular medication for it.  She has been experiencing severe itching on her legs, arms, and back, along with dry skin. She has not noticed any particular changes in her environment that could account for the dry skin.       Previous HPI 08/08/2023 Valerie Ayala is a 38 y.o. female here for follow up for systemic lupus currently on treatment with Saphnelo  infusions 300 mg IV monthly.      Over the past two to three months, she has experienced worsening symptoms, including persistent flu-like symptoms, fatigue, and generalized pain. Despite recent lab results from November showing improvement in lupus markers, she feels her condition is deteriorating.   She describes significant leg swelling, particularly in the left leg. This problem is recurrent, had been worse in the past but trending worse now. The swelling worsens with physical activity, such as cleaning or walking, and is accompanied by pain. She takes vitamin D  daily but not calcium  supplements.   Severe fatigue and muscle weakness are present, with minimal physical activity, like vacuuming, resulting in prolonged pain and inability to raise her arms. Her muscles feel depleted, and she struggles with walking, often needing support to prevent falling.   She experiences frequent urination without pain or blood, feeling the need to urinate constantly with little output each time. No symptoms typical of a bladder infection are present.   She has a history of constipation but experienced diarrhea following a recent flu episode. No nausea is present, but she notes a generally dry mouth.   Persistent itching, particularly after leg swelling subsides, leads to skin damage from scratching. No recent mouth sores, but she reports dry skin and mouth, contributing to angular cheilitis.   She has been taking Lyrica  for pain management but finds it ineffective. Recently increased to 200 mg BID. She previously    She mentions a  history of shingles and has received the first dose of the Shingrix  vaccine, with the second dose planned soon.     Previous HPI 05/08/2023 Valerie Ayala is a 38 y.o. female here for follow up  for systemic lupus currently on treatment with Saphnelo  infusions monthly.  She saw Dr. Urbano for management of chronic fibromyalgia and myofascial pain most recently addition of Lyrica  75 mg.  Overall she feels symptoms  are generally in exacerbation.  She had a very stressful event when her son was in the hospital as a passenger in motor vehicle collision.  For several months has increased symptoms of concentration and short-term memory difficulty, unintentional weight gain, and fatigue.  Is also experiencing urinary frequency with occasional stress incontinence.  Occasional incontinence has been the case for years but the urgency is just a recent development.  She describes it as feeling like she has a UTI but without any pain.   Previous HPI 02/02/2023 Valerie Ayala is a 38 y.o. female here for follow up for systemic lupus currently on treatment with Saphnelo  infusions monthly first dose on March 6 second infusion slightly delayed to April 16 for perioperative management.  Overall she feels better than earlier in this year but she thinks there is less benefit after the more recent Saphnelo  infusions compared to after her first 2 drug treatments.  She had 1 episode of extremely painful oral ulcers since the last visit which were a new symptom for her.  Leg swelling has been intermittent not too severe and without associated skin changes.  Worse problems currently just generalized fatigue and bodyaches with limited exertion tolerance.   Previous HPI 11/04/2022 Valerie Ayala is a 38 y.o. female here for follow up for systemic lupus currently on treatment with Saphnelo  infusions monthly first dose on March 6 second infusion slightly delayed to April 16 for perioperative management.  She reports noticing a definite change after infusion with improvement in her skin rashes swelling though she remains very fatigued.  Reports some waning of the benefit she noticed during the last week or 2 prior to repeat infusion.  But currently doing still doing pretty well from treatment on the 16th of last month.  She has questions about naloxone that she read read about regarding management of her fibromyalgia due to persistent body aches and  fatigue.   Previous HPI 08/05/22 Valerie Ayala is a 38 y.o. female here for follow up for systemic lupus currently off treatment due to worsening in LFTs and lack of symptom response. She continues to have joint pain and swelling in hands and feet and skin rashes.  Raynaud's symptoms are worse during wintertime so far but not having any blistering or skin peeling changes.  Not much swelling back in her legs and no new mouth or nose ulcers.  Very severe fatigue is her most problematic symptom feels like she can sleep for 12 hours or more without having improvement in energy she has been referred for sleep study.   Previous HPI 06/02/22  Valerie Ayala is a 38 y.o. female here for follow up for SLE after last visit discontinued benlysta  due to loss of efficacy and starting methotrexate  15 mg PO weekly and folic acid  1 mg daily and continuing HCQ 300 mg daily. Also 2 week prednisone  taper for general exacerbation of symptoms at that time likely triggered by preceding URI. Since then she continues to feel considerable joint pain and stiffness. Most affected in her bilateral shoulders and  knees but also with hand pain limiting some activities such as coloring. Symptoms are worst overnight and for a few hours each morning. She had at least one episode of bilateral leg swelling lasting several days.    Previous HPI 03/22/22 Valerie Ayala is a 38 y.o. female here for follow up for SLE on HCQ 300 mg daily and benlysta  200 mg [place] weekly. She is currently feeling worse pretty much all over. Especially joint pains and fatigue and increased skin rashes. She had an upper respiratory illness recently she recovered since about a week ago but has other symptoms are all worse since then. Leg swelling is doing okay and no new mouth and nose ulcers.   Previous HPI 10/14/2021 Valerie Ayala is a 38 y.o. female here for follow up for SLE on HCQ 300 mg daily and Benlysta  200 mg [place] weekly. At last visit significant worsening in lower  extremity swelling but normal lab markers and negative for proteinuria.  Since the last visit he continues to do very poorly.  She has ongoing leg swelling itching sometimes there is redness discoloration.  Newer problem is feeling some proximal leg weakness occasional sense of her legs giving out or locking up when walking.  She is extremely fatigued almost every day.  Sometimes she cannot even get out of bed for hours and her mood is very poor due to decreased ability to do anything.  She is also been noticing some palpitations and lightheadedness getting short of breath with very brief or mild exertion.     Previous HPI 06/15/21 Valerie Ayala is a 38 y.o. female here for follow up for SLE on HCQ 300 mg daily and Benlysta  200 mg [place] weekly.  She was doing very well after our last visit until about a month ago now feels everything is horrible.  She has significant bilateral leg swelling with pitting edema developing increased tenderness and painful burning sensation with discolorations and skin changes in both legs.  She feels very fatigued.  Some skin dryness and peeling on the hands but no significant erythematous rash outside of the lower legs.  She recently also had flu with generalized body pains prescribed very large anterior cervical adenopathy.  This improved though she still has a residual ulcer in the mouth. Labs earlier this month showing very low TSH level with recent slight downward adjustment of her thyroid  replacement.  She quit smoking with use of Chantix .   Previous HPI 12/08/20 Valerie Ayala is a 39 y.o. female here for follow up for systemic lupus on hydroxychloroquine  300 mg p.o. daily. No signfiicant improvement since starting hydroxychloroquine  8 weeks ago.  She has not yet seen ophthalmology for retinal exam but plans to do so. Leg swelling continues to come and go, residual erythema and pain and burning sensations.  She continues to feel extremely fatigued as the most noticeable symptom.   She is also had a few mouth sores headaches and swollen glands but also describes the ongoing leg swelling sometimes with pain and discoloration.  Currently her left distal leg is most affected today.   Lupus manifestations Fatigue Arthralgias Photosensitive rash Oral ulcers Raynaud's   Lupus serology ANA 1:80 speckled dsDNA 49 Complement C3, C4 wnl Vit D 19   DMARD Hx Saphnelo  08/2022-current   Review of Systems  Constitutional:  Positive for fatigue.  HENT:  Positive for mouth dryness. Negative for mouth sores.   Eyes:  Positive for dryness.  Respiratory:  Negative for shortness  of breath.   Cardiovascular:  Positive for chest pain. Negative for palpitations.  Gastrointestinal:  Positive for constipation. Negative for blood in stool and diarrhea.  Endocrine: Positive for increased urination.  Genitourinary:  Negative for involuntary urination.  Musculoskeletal:  Positive for joint pain, gait problem, joint pain, joint swelling, myalgias, muscle weakness, morning stiffness, muscle tenderness and myalgias.  Skin:  Positive for color change, hair loss and sensitivity to sunlight. Negative for rash.  Allergic/Immunologic: Positive for susceptible to infections.  Neurological:  Negative for dizziness and headaches.  Hematological:  Positive for swollen glands.  Psychiatric/Behavioral:  Positive for depressed mood. Negative for sleep disturbance. The patient is nervous/anxious.     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   Fibromyalgia 08/08/2022   ASCUS with positive high risk HPV cervical 09/15/2021   History of abnormal cervical Pap smear 09/08/2021   Swelling of lower extremity 12/08/2020   High risk medication use 12/08/2020   Hyperlipidemia 12/03/2020   Hypocalcemia 09/07/2020   Vitamin D  deficiency 09/07/2020   Systemic lupus (HCC) 08/21/2020   Dry mouth 08/21/2020   Tobacco use disorder 08/21/2020    Arthralgia 08/21/2020   Raynaud's phenomenon 08/21/2020   Major depression, recurrent (HCC) 04/26/2018   Adult ADHD 12/16/2014   Asthma 05/07/2013   Hypothyroidism 11/26/2012   GAD (generalized anxiety disorder) 11/26/2012    Past Medical History:  Diagnosis Date   Anxiety    Takes xanax  twice daily   ASCUS with positive high risk HPV cervical 09/15/2021   08/2021 +HPV 16/0ther ASCUS, needs colpo, immediate risk CIN3+ 9%   Asthma    Chronic headaches    multiple times a day; they say it's related to thyroid    Complication of anesthesia    Dysrhythmia    tachycardia   Fibromyalgia    Grave's disease    H/O seasonal allergies    Headache(784.0)    most days   Pneumonia 11/02/2011   years ago   PONV (postoperative nausea and vomiting)    Systemic lupus erythematosus (HCC)    Thyroid  disease     Family History  Problem Relation Age of Onset   Hypertension Mother    Hyperlipidemia Mother    Diabetes Mother    Sleep apnea Mother    Hypertension Father    Hyperlipidemia Father    Hypothyroidism Father    Healthy Sister    Anesthesia problems Neg Hx    Hypotension Neg Hx    Malignant hyperthermia Neg Hx    Pseudochol deficiency Neg Hx    Past Surgical History:  Procedure Laterality Date   teeth pulled     back teeth   THYROIDECTOMY  11/02/2011   Procedure: THYROIDECTOMY;  Surgeon: Ida Loader, MD;  Location: [facility] OR;  Service: ENT;  Laterality: Bilateral;  TOTAL THYROIDECTOMY   TOTAL THYROIDECTOMY  11/02/2011   WISDOM TOOTH EXTRACTION  06/27/2002   Social History   Social History Narrative   Not on file   Immunization History  Administered Date(s) Administered   DTaP 08/24/1984, 11/05/1984, 12/31/1984, 12/24/1985   Hepatitis B 04/26/1996, 05/31/1996, 08/30/1996   IPV 08/24/1984, 11/05/1984, 12/24/1985   Influenza, Seasonal, Injecte, Preservative Fre 05/18/2023   Influenza,inj,Quad PF,6+ Mos 05/25/2020, 05/27/2021, 03/24/2022   Influenza-Unspecified  06/06/2011, 04/25/2012   MMR 12/24/1985   PNEUMOCOCCAL CONJUGATE-20 11/13/2023   PPD Test 03/16/2015, 03/23/2015   Pneumococcal Polysaccharide-23 11/03/2011   Tdap 12/10/2010, 07/28/2021   Zoster Recombinant(Shingrix ) 05/01/2023, 08/14/2023  Objective: Vital Signs: BP 103/70 (BP Location: Left Arm, Patient Position: Sitting, Cuff Size: Normal)   Pulse 73   Resp 14   Ht 5' 7 (1.702 m)   Wt 193 lb (87.5 kg)   BMI 30.23 kg/m    Physical Exam HENT:     Nose: Nose normal.     Mouth/Throat:     Pharynx: Oropharynx is clear.     Comments: Small areas of erythema and skin cracking at corners of mouth, no oral ulcers Eyes:     Conjunctiva/sclera: Conjunctivae normal.  Cardiovascular:     Rate and Rhythm: Normal rate and regular rhythm.  Pulmonary:     Effort: Pulmonary effort is normal.     Breath sounds: Normal breath sounds.  Lymphadenopathy:     Cervical: No cervical adenopathy.  Skin:    General: Skin is warm and dry.  Neurological:     Mental Status: She is alert.  Psychiatric:        Mood and Affect: Mood normal.      Musculoskeletal Exam:  Shoulders full ROM no tenderness or swelling Elbows full ROM no tenderness or swelling Wrists full ROM no tenderness or swelling Fingers full ROM no tenderness or swelling Tenderness to pressure in low back muscles and on lateral hips b/l, no radiation Knees full ROM no tenderness or swelling Ankles full ROM no tenderness or swelling   Investigation: No additional findings.  Imaging: No results found.  Recent Labs: Lab Results  Component Value Date   WBC 11.3 (H) 11/10/2023   HGB 13.2 11/10/2023   PLT 323 11/10/2023   NA 139 11/10/2023   K 3.6 11/10/2023   CL 98 11/10/2023   CO2 24 11/10/2023   GLUCOSE 90 11/10/2023   BUN 11 11/10/2023   CREATININE 1.27 (H) 11/10/2023   BILITOT 0.7 11/10/2023   ALKPHOS 93 11/10/2023   AST 29 11/10/2023   ALT 15 11/10/2023   PROT 7.5 11/10/2023   ALBUMIN 4.9 11/10/2023    CALCIUM  7.9 (L) 11/10/2023   GFRAA 80 02/27/2020   QFTBGOLDPLUS NEGATIVE 12/08/2020    Speciality Comments: No specialty comments available.  Procedures:  No procedures performed Allergies: Patient has no known allergies.   Assessment / Plan:     Visit Diagnoses: Systemic lupus erythematosus, unspecified SLE type, unspecified organ involvement status (HCC) Lupus with persistent positive double-stranded DNA despite partial improvement with Saphnelo  infusions.  There is no appreciable synovitis or pedal edema on exam today.  Skin complaints do not seem typical for cutaneous lupus and no oral nasal ulcers. -Rechecking double-stranded ENA antibody titer for disease activity monitoring - Continue Saphnelo  300 mg IV monthly - Resume hydroxychloroquine  200 mg p.o. daily  High risk medication use - Saphnelo  infusion 300 mg IV monthly. Has been tolerating infusions with no reported adverse effect.  No serious interval infections. 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 No risk of toxicity with supplementation as it is a water-soluble vitamin. - Provide information on riboflavin supplementation, over-the-counter drug information provided  Dry skin and itching Persistent dry skin and itching, possibly related to riboflavin deficiency. - Provide information on riboflavin supplementation.  Weight gain Unexplained weight gain despite low food intake and no change in activity level. Recent weight loss post-infusion possibly due to fluid changes.   Orders: No orders of the  defined types were placed in this encounter.  No orders of the defined types were placed in this encounter.    Follow-Up Instructions: No follow-ups on file.   Lonni LELON Ester,  MD  Note - This record has been created using [employer].  Chart creation errors have been sought, but may not always  have been located. Such creation errors do not reflect on  the standard of medical care.

## 2023-12-25 DIAGNOSIS — M329 Systemic lupus erythematosus, unspecified: Secondary | ICD-10-CM | POA: Diagnosis not present

## 2023-12-26 ENCOUNTER — Telehealth: Payer: Self-pay | Admitting: Pharmacist

## 2023-12-26 NOTE — Telephone Encounter (Signed)
 Received fax from [facility] regarding Saphnelo  IV [phone]) infusion that patient received on 12/25/2023. Dose: 300mg  Labs were not drawn.  Patient tolerated infusion without complications.  Changes/concerns since last visit: none noted  Next Saphnelo  infusion scheduled for 7/28/202  Aurielle Slingerland, PharmD, MPH, BCPS, CPP Clinical Pharmacist (Rheumatology and Pulmonology)

## 2023-12-27 ENCOUNTER — Encounter: Payer: Self-pay | Admitting: Internal Medicine

## 2023-12-27 ENCOUNTER — Ambulatory Visit: Attending: Internal Medicine | Admitting: Internal Medicine

## 2023-12-27 ENCOUNTER — Other Ambulatory Visit (HOSPITAL_COMMUNITY): Payer: Self-pay

## 2023-12-27 VITALS — BP 103/70 | HR 73 | Resp 14 | Ht 67.0 in | Wt 193.0 lb

## 2023-12-27 DIAGNOSIS — K130 Diseases of lips: Secondary | ICD-10-CM | POA: Diagnosis not present

## 2023-12-27 DIAGNOSIS — M797 Fibromyalgia: Secondary | ICD-10-CM | POA: Insufficient documentation

## 2023-12-27 DIAGNOSIS — E559 Vitamin D deficiency, unspecified: Secondary | ICD-10-CM | POA: Diagnosis not present

## 2023-12-27 DIAGNOSIS — M329 Systemic lupus erythematosus, unspecified: Secondary | ICD-10-CM | POA: Diagnosis not present

## 2023-12-27 DIAGNOSIS — Z79899 Other long term (current) drug therapy: Secondary | ICD-10-CM | POA: Diagnosis not present

## 2023-12-27 MED ORDER — HYDROXYCHLOROQUINE SULFATE 200 MG PO TABS: 200.0000 mg | ORAL_TABLET | Freq: Every day | ORAL | 0 refills | Status: DC

## 2023-12-27 MED ORDER — HYDROXYCHLOROQUINE SULFATE 200 MG PO TABS
200.0000 mg | ORAL_TABLET | Freq: Every day | ORAL | 0 refills | Status: DC
  Filled 2023-12-27: qty 90, 90d supply, fill #0

## 2023-12-27 NOTE — Patient Instructions (Addendum)
 I recommend adding a vitamin B2 supplement or a B-complex vitamin containing B2 daily for at least 4 weeks for the current cheilitis and skin overall. Any of these supplements usually contain plenty whether it is anywhere 50-400mg , since this is a water soluble vitamin and limits absorption.

## 2023-12-28 LAB — CBC WITH DIFFERENTIAL/PLATELET
Absolute Lymphocytes: 3524 {cells}/uL (ref 850–3900)
Absolute Monocytes: 463 {cells}/uL (ref 200–950)
Basophils Absolute: 125 {cells}/uL (ref 0–200)
Basophils Relative: 1.4 %
Eosinophils Absolute: 258 {cells}/uL (ref 15–500)
Eosinophils Relative: 2.9 %
HCT: 41.3 % (ref 35.0–45.0)
Hemoglobin: 13.5 g/dL (ref 11.7–15.5)
MCH: 29.7 pg (ref 27.0–33.0)
MCHC: 32.7 g/dL (ref 32.0–36.0)
MCV: 91 fL (ref 80.0–100.0)
MPV: 10.1 fL (ref 7.5–12.5)
Monocytes Relative: 5.2 %
Neutro Abs: 4530 {cells}/uL (ref 1500–7800)
Neutrophils Relative %: 50.9 %
Platelets: 302 10*3/uL (ref 140–400)
RBC: 4.54 10*6/uL (ref 3.80–5.10)
RDW: 17.1 % — ABNORMAL HIGH (ref 11.0–15.0)
Total Lymphocyte: 39.6 %
WBC: 8.9 10*3/uL (ref 3.8–10.8)

## 2023-12-28 LAB — COMPREHENSIVE METABOLIC PANEL WITH GFR
AG Ratio: 1.7 (calc) (ref 1.0–2.5)
ALT: 11 U/L (ref 6–29)
AST: 16 U/L (ref 10–30)
Albumin: 4.5 g/dL (ref 3.6–5.1)
Alkaline phosphatase (APISO): 64 U/L (ref 31–125)
BUN: 8 mg/dL (ref 7–25)
CO2: 27 mmol/L (ref 20–32)
Calcium: 7.8 mg/dL — ABNORMAL LOW (ref 8.6–10.2)
Chloride: 103 mmol/L (ref 98–110)
Creat: 0.95 mg/dL (ref 0.50–0.97)
Globulin: 2.6 g/dL (ref 1.9–3.7)
Glucose, Bld: 81 mg/dL (ref 65–99)
Potassium: 3.7 mmol/L (ref 3.5–5.3)
Sodium: 140 mmol/L (ref 135–146)
Total Bilirubin: 0.9 mg/dL (ref 0.2–1.2)
Total Protein: 7.1 g/dL (ref 6.1–8.1)
eGFR: 78 mL/min/{1.73_m2} (ref >=60)

## 2023-12-28 LAB — ANTI-DNA ANTIBODY, DOUBLE-STRANDED: ds DNA Ab: 32 [IU]/mL — ABNORMAL HIGH

## 2024-01-11 ENCOUNTER — Ambulatory Visit: Admitting: Family Medicine

## 2024-01-15 ENCOUNTER — Ambulatory Visit: Admitting: Family Medicine

## 2024-01-16 ENCOUNTER — Encounter: Payer: Self-pay | Admitting: Family Medicine

## 2024-01-23 ENCOUNTER — Telehealth: Payer: Self-pay | Admitting: Pharmacist

## 2024-01-23 NOTE — Telephone Encounter (Signed)
 Received fax from [facility]. Patient was a no-call no-show to Saphnelo  infusion that was scheduled for 01/22/2024  Sherry Pennant, PharmD, MPH, BCPS, CPP Clinical Pharmacist (Rheumatology and Pulmonology)

## 2024-01-26 DIAGNOSIS — M329 Systemic lupus erythematosus, unspecified: Secondary | ICD-10-CM | POA: Diagnosis not present

## 2024-01-29 ENCOUNTER — Telehealth: Payer: Self-pay | Admitting: Pharmacist

## 2024-01-29 NOTE — Telephone Encounter (Signed)
 Received fax from [facility] regarding Saphnelo  IV [phone]) infusion that patient received on 01/26/2024. Dose: 300mg  Labs were drawn - CBC, CMP, ESR, antiDNA antibody, C3 and C4  Patient tolerated infusion without complications.  Changes/concerns since last visit: none  Next Saphnelo  infusion scheduled for 02/23/2024  Sherry Pennant, PharmD, MPH, BCPS, CPP Clinical Pharmacist (Rheumatology and Pulmonology)

## 2024-01-30 ENCOUNTER — Telehealth: Payer: Self-pay

## 2024-01-30 NOTE — Telephone Encounter (Signed)
 Received labs from [facility] for the patient. Labs placed on Dr. Joyce desk for review.

## 2024-02-09 ENCOUNTER — Other Ambulatory Visit: Payer: Self-pay | Admitting: Family Medicine

## 2024-02-09 DIAGNOSIS — E890 Postprocedural hypothyroidism: Secondary | ICD-10-CM

## 2024-02-19 ENCOUNTER — Ambulatory Visit (INDEPENDENT_AMBULATORY_CARE_PROVIDER_SITE_OTHER): Admitting: *Deleted

## 2024-02-19 DIAGNOSIS — Z3042 Encounter for surveillance of injectable contraceptive: Secondary | ICD-10-CM

## 2024-02-19 NOTE — Progress Notes (Signed)
 Patient is in office today for a nurse visit for Birth Control Injection. Patient Injection was given in the  Left upper quad. gluteus. Patient tolerated injection well.

## 2024-03-04 DIAGNOSIS — M329 Systemic lupus erythematosus, unspecified: Secondary | ICD-10-CM | POA: Diagnosis not present

## 2024-03-04 LAB — LAB REPORT - SCANNED: EGFR: 82

## 2024-03-20 NOTE — Progress Notes (Signed)
 Office Visit Note  Patient: Valerie Ayala             Date of Birth: [birth year]           MRN: [id number]             PCP: Dettinger, Fonda LABOR, MD Referring: Dettinger, Fonda LABOR, MD Visit Date: 04/03/2024   Subjective:  Medical Management of Chronic Issues (Patient has been researching and believes she may have cushings syndrome , would like to be tested if possible. ) and Lupus (Would like something to help with her energy )  Discussed the use of AI scribe software for clinical note transcription with the patient, who gave verbal consent to proceed.  History of Present Illness   Valerie Ayala is a 38 y.o. female here for follow up for systemic lupus currently on treatment with Saphnelo  infusions 300 mg IV monthly and HCQ 200 mg daily.    She experiences severe fatigue, describing it as feeling 'tired and tired and tired' with associated arthralgia in her elbows and knees. She has gained 70 pounds, complains of gain primarily in the abdominal area, and notes a 'moon face' appearance. She is concerned about the possibility of Cushing's syndrome, as her mother had similar symptoms. No recent steroid use, with the last prednisone  use about a year ago. No unusual supplements taken.  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 Her recent calcium  level was 7.4 mg/dL. She takes over-the-counter calcium  and vitamin D  supplements but feels it is insufficient. She has not seen an endocrinologist in years, and her primary care doctor manages her thyroid  medication.  Her thyroid  function was previously affected by a pharmacy error, where she was given 25 micrograms instead of the prescribed 225 micrograms of her thyroid  medication. She has been on the correct dosage for about three months now.  She has a history of ADHD and previously used Adderall  but stopped due to heart palpitations. She uses cannabis for pain management, which affects  her ability to obtain Adderall .      Previous HPI 12/27/2023 Valerie Ayala is a 38 y.o. female here for follow up for systemic lupus currently on treatment with Saphnelo  infusions 300 mg IV monthly.     She has been experiencing persistent and worsening angular cheilitis. Recent laboratory results revealed a low level of vitamin B2 (riboflavin). She is borderline iron deficient as well.   She has been receiving infusions for lupus, which have led to partial improvement in her lab markers, specifically a decrease in her double-stranded DNA titer. Despite this, she does not feel significantly better post-infusions. She experiences unpredictable leg swelling, which has been less frequent recently. She has gained weight, although she reports eating infrequently, sometimes only once a day, and has not noticed a change in her activity level.   This morning, she experienced chest pain for the first time, located centrally, lasting about thirty minutes. She initially thought she was having a heart attack. She also reports swelling in her hands, making it difficult to remove her rings. She experiences occasional acid reflux, for which she uses Tums, but does not take regular medication for it.   She has been experiencing severe itching on her legs, arms, and back, along with dry skin. She has not noticed any particular changes in her environment that could account for the dry skin.         Previous HPI 08/08/2023  Valerie Ayala is a 38 y.o. female here for follow up for systemic lupus currently on treatment with Saphnelo  infusions 300 mg IV monthly.     Over the past two to three months, she has experienced worsening symptoms, including persistent flu-like symptoms, fatigue, and generalized pain. Despite recent lab results from November showing improvement in lupus markers, she feels her condition is deteriorating.   She describes significant leg swelling, particularly in the left leg. This problem is  recurrent, had been worse in the past but trending worse now. The swelling worsens with physical activity, such as cleaning or walking, and is accompanied by pain. She takes vitamin D  daily but not calcium  supplements.   Severe fatigue and muscle weakness are present, with minimal physical activity, like vacuuming, resulting in prolonged pain and inability to raise her arms. Her muscles feel depleted, and she struggles with walking, often needing support to prevent falling.   She experiences frequent urination without pain or blood, feeling the need to urinate constantly with little output each time. No symptoms typical of a bladder infection are present.   She has a history of constipation but experienced diarrhea following a recent flu episode. No nausea is present, but she notes a generally dry mouth.   Persistent itching, particularly after leg swelling subsides, leads to skin damage from scratching. No recent mouth sores, but she reports dry skin and mouth, contributing to angular cheilitis.   She has been taking Lyrica  for pain management but finds it ineffective. Recently increased to 200 mg BID. She previously    She mentions a history of shingles and has received the first dose of the Shingrix  vaccine, with the second dose planned soon.     Previous HPI 05/08/2023 Valerie Ayala is a 38 y.o. female here for follow up  for systemic lupus currently on treatment with Saphnelo  infusions monthly.  She saw Dr. Urbano for management of chronic fibromyalgia and myofascial pain most recently addition of Lyrica  75 mg.  Overall she feels symptoms are generally in exacerbation.  She had a very stressful event when her son was in the hospital as a passenger in motor vehicle collision.  For several months has increased symptoms of concentration and short-term memory difficulty, unintentional weight gain, and fatigue.  Is also experiencing urinary frequency with occasional stress incontinence.   Occasional incontinence has been the case for years but the urgency is just a recent development.  She describes it as feeling like she has a UTI but without any pain.   Previous HPI 02/02/2023 Valerie Ayala is a 38 y.o. female here for follow up for systemic lupus currently on treatment with Saphnelo  infusions monthly first dose on March 6 second infusion slightly delayed to April 16 for perioperative management.  Overall she feels better than earlier in this year but she thinks there is less benefit after the more recent Saphnelo  infusions compared to after her first 2 drug treatments.  She had 1 episode of extremely painful oral ulcers since the last visit which were a new symptom for her.  Leg swelling has been intermittent not too severe and without associated skin changes.  Worse problems currently just generalized fatigue and bodyaches with limited exertion tolerance.   Previous HPI 11/04/2022 Valerie Ayala is a 38 y.o. female here for follow up for systemic lupus currently on treatment with Saphnelo  infusions monthly first dose on March 6 second infusion slightly delayed to April 16 for perioperative management.  She reports noticing  a definite change after infusion with improvement in her skin rashes swelling though she remains very fatigued.  Reports some waning of the benefit she noticed during the last week or 2 prior to repeat infusion.  But currently doing still doing pretty well from treatment on the 16th of last month.  She has questions about naloxone that she read read about regarding management of her fibromyalgia due to persistent body aches and fatigue.   Previous HPI 08/05/22 Valerie Ayala is a 38 y.o. female here for follow up for systemic lupus currently off treatment due to worsening in LFTs and lack of symptom response. She continues to have joint pain and swelling in hands and feet and skin rashes.  Raynaud's symptoms are worse during wintertime so far but not having any blistering or  skin peeling changes.  Not much swelling back in her legs and no new mouth or nose ulcers.  Very severe fatigue is her most problematic symptom feels like she can sleep for 12 hours or more without having improvement in energy she has been referred for sleep study.   Previous HPI 06/02/22  Valerie Ayala is a 38 y.o. female here for follow up for SLE after last visit discontinued benlysta  due to loss of efficacy and starting methotrexate  15 mg PO weekly and folic acid  1 mg daily and continuing HCQ 300 mg daily. Also 2 week prednisone  taper for general exacerbation of symptoms at that time likely triggered by preceding URI. Since then she continues to feel considerable joint pain and stiffness. Most affected in her bilateral shoulders and knees but also with hand pain limiting some activities such as coloring. Symptoms are worst overnight and for a few hours each morning. She had at least one episode of bilateral leg swelling lasting several days.    Previous HPI 03/22/22 Valerie Ayala is a 38 y.o. female here for follow up for SLE on HCQ 300 mg daily and benlysta  200 mg [place] weekly. She is currently feeling worse pretty much all over. Especially joint pains and fatigue and increased skin rashes. She had an upper respiratory illness recently she recovered since about a week ago but has other symptoms are all worse since then. Leg swelling is doing okay and no new mouth and nose ulcers.   Previous HPI 10/14/2021 Valerie Ayala is a 37 y.o. female here for follow up for SLE on HCQ 300 mg daily and Benlysta  200 mg [place] weekly. At last visit significant worsening in lower extremity swelling but normal lab markers and negative for proteinuria.  Since the last visit he continues to do very poorly.  She has ongoing leg swelling itching sometimes there is redness discoloration.  Newer problem is feeling some proximal leg weakness occasional sense of her legs giving out or locking up when walking.  She is extremely fatigued  almost every day.  Sometimes she cannot even get out of bed for hours and her mood is very poor due to decreased ability to do anything.  She is also been noticing some palpitations and lightheadedness getting short of breath with very brief or mild exertion.     Previous HPI 06/15/21 Valerie Ayala is a 38 y.o. female here for follow up for SLE on HCQ 300 mg daily and Benlysta  200 mg [place] weekly.  She was doing very well after our last visit until about a month ago now feels everything is horrible.  She has significant bilateral leg swelling with pitting edema developing  increased tenderness and painful burning sensation with discolorations and skin changes in both legs.  She feels very fatigued.  Some skin dryness and peeling on the hands but no significant erythematous rash outside of the lower legs.  She recently also had flu with generalized body pains prescribed very large anterior cervical adenopathy.  This improved though she still has a residual ulcer in the mouth. Labs earlier this month showing very low TSH level with recent slight downward adjustment of her thyroid  replacement.  She quit smoking with use of Chantix .   Previous HPI 12/08/20 Valerie Ayala is a 38 y.o. female here for follow up for systemic lupus on hydroxychloroquine  300 mg p.o. daily. No signfiicant improvement since starting hydroxychloroquine  8 weeks ago.  She has not yet seen ophthalmology for retinal exam but plans to do so. Leg swelling continues to come and go, residual erythema and pain and burning sensations.  She continues to feel extremely fatigued as the most noticeable symptom.  She is also had a few mouth sores headaches and swollen glands but also describes the ongoing leg swelling sometimes with pain and discoloration.  Currently her left distal leg is most affected today.   Lupus manifestations Fatigue Arthralgias Photosensitive rash Oral ulcers Raynaud's   Lupus serology ANA 1:80 speckled dsDNA 49 Complement  C3, C4 wnl Vit D 19   DMARD Hx Saphnelo  08/2022-current     Review of Systems  Constitutional:  Positive for fatigue.  HENT:  Positive for mouth dryness. Negative for mouth sores.   Eyes:  Positive for dryness.  Respiratory:  Positive for shortness of breath.   Cardiovascular:  Positive for palpitations. Negative for chest pain.  Gastrointestinal:  Positive for blood in stool, constipation and diarrhea.  Endocrine: Positive for increased urination.  Genitourinary:  Positive for involuntary urination.  Musculoskeletal:  Positive for joint pain, gait problem, joint pain, joint swelling, myalgias, muscle weakness, morning stiffness, muscle tenderness and myalgias.  Skin:  Positive for color change, hair loss and sensitivity to sunlight. Negative for rash.  Allergic/Immunologic: Positive for susceptible to infections.  Neurological:  Positive for dizziness and headaches.  Hematological:  Positive for swollen glands.  Psychiatric/Behavioral:  Negative for depressed mood and sleep disturbance. The patient is not nervous/anxious.     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   Fibromyalgia 08/08/2022   ASCUS with positive high risk HPV cervical 09/15/2021   History of abnormal cervical Pap smear 09/08/2021   Swelling of lower extremity 12/08/2020   High risk medication use 12/08/2020   Hyperlipidemia 12/03/2020   Hypocalcemia 09/07/2020   Vitamin D  deficiency 09/07/2020   Systemic lupus (HCC) 08/21/2020   Dry mouth 08/21/2020   Tobacco use disorder 08/21/2020   Arthralgia 08/21/2020   Raynaud's phenomenon 08/21/2020   Major depression, recurrent 04/26/2018   Adult ADHD 12/16/2014   Asthma 05/07/2013   Hypothyroidism 11/26/2012   GAD (generalized anxiety disorder) 11/26/2012    Past Medical History:  Diagnosis Date   Anxiety    Takes xanax  twice daily   ASCUS with positive high risk HPV cervical 09/15/2021   08/2021  +HPV 16/0ther ASCUS, needs colpo, immediate risk CIN3+ 9%   Asthma    Chronic headaches    multiple times a day; they say it's related to thyroid    Complication of anesthesia    Dysrhythmia    tachycardia   Fibromyalgia    Grave's disease    H/O seasonal  allergies    Headache(784.0)    most days   Pneumonia 11/02/2011   years ago   PONV (postoperative nausea and vomiting)    Systemic lupus erythematosus (HCC)    Thyroid  disease     Family History  Problem Relation Age of Onset   Hypertension Mother    Hyperlipidemia Mother    Diabetes Mother    Sleep apnea Mother    Hypertension Father    Hyperlipidemia Father    Hypothyroidism Father    Healthy Sister    Anesthesia problems Neg Hx    Hypotension Neg Hx    Malignant hyperthermia Neg Hx    Pseudochol deficiency Neg Hx    Past Surgical History:  Procedure Laterality Date   teeth pulled     back teeth   THYROIDECTOMY  11/02/2011   Procedure: THYROIDECTOMY;  Surgeon: Ida Loader, MD;  Location: [facility] OR;  Service: ENT;  Laterality: Bilateral;  TOTAL THYROIDECTOMY   TOTAL THYROIDECTOMY  11/02/2011   WISDOM TOOTH EXTRACTION  06/27/2002   Social History   Social History Narrative   Not on file   Immunization History  Administered Date(s) Administered   DTaP 08/24/1984, 11/05/1984, 12/31/1984, 12/24/1985   Hepatitis B 04/26/1996, 05/31/1996, 08/30/1996   IPV 08/24/1984, 11/05/1984, 12/24/1985   Influenza, Seasonal, Injecte, Preservative Fre 05/18/2023   Influenza,inj,Quad PF,6+ Mos 05/25/2020, 05/27/2021, 03/24/2022   Influenza-Unspecified 06/06/2011, 04/25/2012   MMR 12/24/1985   PNEUMOCOCCAL CONJUGATE-20 11/13/2023   PPD Test 03/16/2015, 03/23/2015   Pneumococcal Polysaccharide-23 11/03/2011   Tdap 12/10/2010, 07/28/2021   Zoster Recombinant(Shingrix ) 05/01/2023, 08/14/2023     Objective: Vital Signs: BP 116/75   Pulse (!) 102   Temp 97.7 F (36.5 C)   Resp 16   Ht 5' 7 (1.702 m)   Wt 204 lb (92.5 kg)    BMI 31.95 kg/m    Physical Exam Eyes:     Conjunctiva/sclera: Conjunctivae normal.  Cardiovascular:     Rate and Rhythm: Normal rate and regular rhythm.  Pulmonary:     Effort: Pulmonary effort is normal.     Breath sounds: Normal breath sounds.  Lymphadenopathy:     Cervical: No cervical adenopathy.  Skin:    General: Skin is warm and dry.  Neurological:     Mental Status: She is alert.  Psychiatric:        Mood and Affect: Mood normal.      Musculoskeletal Exam:  Shoulders full ROM no tenderness or swelling Elbows full ROM no tenderness or swelling Wrists full ROM no tenderness or swelling Fingers full ROM no tenderness or swelling Tenderness to pressure in low back muscles and on lateral hips b/l, no radiation Knees full ROM no tenderness or swelling Ankles full ROM no tenderness or swelling   Investigation: No additional findings.  Imaging: No results found.  Recent Labs: Lab Results  Component Value Date   WBC 8.9 12/27/2023   HGB 13.5 12/27/2023   PLT 302 12/27/2023   NA 140 12/27/2023   K 3.7 12/27/2023   CL 103 12/27/2023   CO2 27 12/27/2023   GLUCOSE 81 12/27/2023   BUN 8 12/27/2023   CREATININE 0.95 12/27/2023   BILITOT 0.9 12/27/2023   ALKPHOS 93 11/10/2023   AST 16 12/27/2023   ALT 11 12/27/2023   PROT 7.1 12/27/2023   ALBUMIN 4.9 11/10/2023   CALCIUM  7.8 (L) 12/27/2023   GFRAA 80 02/27/2020   QFTBGOLDPLUS NEGATIVE 12/08/2020    Speciality Comments: No specialty comments available.  Procedures:  No procedures performed Allergies: Patient has no known allergies.   Assessment / Plan:     Visit Diagnoses: Systemic lupus erythematosus, unspecified SLE type, unspecified organ involvement status (HCC) Chronic condition with persistent fatigue, joint pain, and weight gain. Managed with hydroxychloroquine  and saphnelo  infusions with overall modest improvement so far. No contraindications for CNS stimulants, but not managed by this  provider. - Continue hydroxychloroquine  200 mg daily. - Continue saphenlo infusions 300 mg IV monthly - Coordinate with primary care or psychiatrist for potential CNS stimulant management.  High risk medication use - Saphnelo  infusion 300 mg IV monthly  Fibromyalgia Chronic pain and fatigue consistent with fibromyalgia. Current treatment includes [place] infusions without significant symptom relief.  Fatigue, under evaluation for Cushing syndrome Severe fatigue with concern for Cushing syndrome due to weight gain and moon face. Differential includes Cushing syndrome, thyroid  dysfunction, and hypocalcemia. Recent thyroid  medication error corrected, but fatigue persists. Low calcium  levels noted, potentially contributing to symptoms. - Order morning cortisol and ACTH levels to evaluate for Cushing syndrome. - Coordinate with primary care for blood tests. - Monitor calcium  levels.  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 Low calcium  levels potentially contributing to fatigue and other symptoms. Current calcium  and vitamin D  supplementation may be inadequate. - Increase calcium  and vitamin D  supplementation. - Monitor calcium  levels closely.        Orders: No orders of the defined types were placed in this encounter.  No orders of the defined types were placed in this encounter.    Follow-Up Instructions: Return in about 3 months (around 07/04/2024) for SLE on HCQ/SAPHf/u 3mos.   Lonni LELON Ester, MD  Note - This record has been created using [employer].  Chart creation errors have been sought, but may not always  have been located. Such creation errors do not reflect on  the standard of medical care.

## 2024-03-28 ENCOUNTER — Other Ambulatory Visit: Payer: Self-pay | Admitting: Internal Medicine

## 2024-03-28 DIAGNOSIS — M329 Systemic lupus erythematosus, unspecified: Secondary | ICD-10-CM

## 2024-03-28 NOTE — Telephone Encounter (Signed)
 Last Fill: 12/27/2023  Eye exam: not on file  Labs:  12/27/2023 RDW 17.1, Calcium  7.8  Next Visit: 04/03/2024  Last Visit: 12/27/2023  IK:Dbduzfpr lupus erythematosus, unspecified SLE type, unspecified organ involvement status   Current Dose per office note 12/27/2023: hydroxychloroquine  200 mg p.o. daily   Spoke with patient she has not had her PLQ eye exam. Patient states she will call around to find an eye doctor who can do it for her.   Okay to refill Plaquenil ?

## 2024-04-03 ENCOUNTER — Ambulatory Visit: Attending: Internal Medicine | Admitting: Internal Medicine

## 2024-04-03 ENCOUNTER — Encounter: Payer: Self-pay | Admitting: Internal Medicine

## 2024-04-03 VITALS — BP 116/75 | HR 102 | Temp 97.7°F | Resp 16 | Ht 67.0 in | Wt 204.0 lb

## 2024-04-03 DIAGNOSIS — M329 Systemic lupus erythematosus, unspecified: Secondary | ICD-10-CM | POA: Insufficient documentation

## 2024-04-03 DIAGNOSIS — M797 Fibromyalgia: Secondary | ICD-10-CM | POA: Diagnosis not present

## 2024-04-03 DIAGNOSIS — K130 Diseases of lips: Secondary | ICD-10-CM | POA: Insufficient documentation

## 2024-04-03 DIAGNOSIS — Z79899 Other long term (current) drug therapy: Secondary | ICD-10-CM | POA: Diagnosis not present

## 2024-04-29 ENCOUNTER — Encounter: Payer: Self-pay | Admitting: Family Medicine

## 2024-04-29 ENCOUNTER — Encounter: Payer: Self-pay | Admitting: Internal Medicine

## 2024-04-29 DIAGNOSIS — Z79899 Other long term (current) drug therapy: Secondary | ICD-10-CM

## 2024-04-29 DIAGNOSIS — M329 Systemic lupus erythematosus, unspecified: Secondary | ICD-10-CM

## 2024-04-30 DIAGNOSIS — M329 Systemic lupus erythematosus, unspecified: Secondary | ICD-10-CM | POA: Diagnosis not present

## 2024-05-01 ENCOUNTER — Telehealth: Payer: Self-pay | Admitting: Pharmacist

## 2024-05-01 NOTE — Telephone Encounter (Signed)
 Received fax from [facility] regarding Saphnelo  IV [phone]) infusion that patient received on 04/30/2024. Dose: 300mg  Labs were not drawn.  Patient tolerated infusion without complications.  Changes/concerns since last visit:   Next Saphnelo  infusion scheduled for 05/30/2024  Sherry Pennant, PharmD, MPH, BCPS, CPP Clinical Pharmacist [facility])

## 2024-05-07 ENCOUNTER — Other Ambulatory Visit: Payer: Self-pay | Admitting: Family Medicine

## 2024-05-07 DIAGNOSIS — Z3009 Encounter for other general counseling and advice on contraception: Secondary | ICD-10-CM

## 2024-05-08 ENCOUNTER — Other Ambulatory Visit

## 2024-05-08 ENCOUNTER — Ambulatory Visit: Payer: Self-pay | Admitting: *Deleted

## 2024-05-08 DIAGNOSIS — M329 Systemic lupus erythematosus, unspecified: Secondary | ICD-10-CM

## 2024-05-08 DIAGNOSIS — Z3042 Encounter for surveillance of injectable contraceptive: Secondary | ICD-10-CM | POA: Diagnosis not present

## 2024-05-08 DIAGNOSIS — Z79899 Other long term (current) drug therapy: Secondary | ICD-10-CM | POA: Diagnosis not present

## 2024-05-08 MED ORDER — MEDROXYPROGESTERONE ACETATE 150 MG/ML IM SUSP
150.0000 mg | INTRAMUSCULAR | Status: AC
  Administered 2024-05-08 – 2024-08-02 (×2): 150 mg via INTRAMUSCULAR

## 2024-05-08 NOTE — Progress Notes (Signed)
Patient is in office today for a nurse visit for Birth Control Injection. Patient Injection was given in the  Right upper quad. gluteus. Patient tolerated injection well.

## 2024-05-09 LAB — CORTISOL: Cortisol: 11.2 ug/dL (ref 6.2–19.4)

## 2024-05-09 LAB — ACTH: ACTH: 75.3 pg/mL — ABNORMAL HIGH (ref 7.2–63.3)

## 2024-05-22 ENCOUNTER — Encounter: Payer: Self-pay | Admitting: Family Medicine

## 2024-05-22 ENCOUNTER — Other Ambulatory Visit (HOSPITAL_BASED_OUTPATIENT_CLINIC_OR_DEPARTMENT_OTHER): Payer: Self-pay

## 2024-05-22 ENCOUNTER — Other Ambulatory Visit: Payer: Self-pay | Admitting: Family Medicine

## 2024-05-22 ENCOUNTER — Ambulatory Visit: Admitting: Family Medicine

## 2024-05-22 VITALS — BP 112/80 | HR 100 | Ht 67.0 in | Wt 196.0 lb

## 2024-05-22 DIAGNOSIS — E890 Postprocedural hypothyroidism: Secondary | ICD-10-CM

## 2024-05-22 DIAGNOSIS — E782 Mixed hyperlipidemia: Secondary | ICD-10-CM | POA: Diagnosis not present

## 2024-05-22 DIAGNOSIS — M329 Systemic lupus erythematosus, unspecified: Secondary | ICD-10-CM | POA: Diagnosis not present

## 2024-05-22 DIAGNOSIS — F909 Attention-deficit hyperactivity disorder, unspecified type: Secondary | ICD-10-CM | POA: Diagnosis not present

## 2024-05-22 DIAGNOSIS — Z23 Encounter for immunization: Secondary | ICD-10-CM

## 2024-05-22 DIAGNOSIS — F411 Generalized anxiety disorder: Secondary | ICD-10-CM

## 2024-05-22 DIAGNOSIS — F331 Major depressive disorder, recurrent, moderate: Secondary | ICD-10-CM

## 2024-05-22 LAB — LIPID PANEL

## 2024-05-22 MED ORDER — AMPHETAMINE-DEXTROAMPHET ER 20 MG PO CP24: 20.0000 mg | ORAL_CAPSULE | ORAL | 0 refills | Status: DC

## 2024-05-22 MED ORDER — LEVOTHYROXINE SODIUM 25 MCG PO TABS: 12.5000 ug | ORAL_TABLET | Freq: Every day | ORAL | 1 refills | Status: DC

## 2024-05-22 MED ORDER — LEVOTHYROXINE SODIUM 200 MCG PO TABS: 200.0000 ug | ORAL_TABLET | Freq: Every day | ORAL | 1 refills | Status: AC

## 2024-05-22 NOTE — Progress Notes (Signed)
 BP 112/80   Pulse 100   Ht 5' 7 (1.702 m)   Wt 196 lb (88.9 kg)   SpO2 96%   BMI 30.70 kg/m    Subjective:   Patient ID: Valerie Ayala, female    DOB: 1986-02-13, 38 y.o.   MRN: [id number]  HPI: Valerie Ayala is a 38 y.o. female presenting on 05/22/2024 for Medical Management of Chronic Issues, Anxiety, and Hypothyroidism   Discussed the use of AI scribe software for clinical note transcription with the patient, who gave verbal consent to proceed.  History of Present Illness   Valerie Ayala is a 38 year old female with hypothyroidism and lupus who presents for medication management and evaluation of fatigue.  Thyroid  hormone replacement therapy - No thyroid  gland, requiring lifelong thyroid  hormone replacement - Out of thyroid  medication for approximately four days - Recent medication dosage mix-up resulted in taking a lower dose than prescribed - Currently on correct dose: 200 micrograms plus half of a 25 microgram tablet daily  Fatigue - Persistent fatigue impacting daily activities - Exploring management options for fatigue, including information from lupus support groups - Stimulant medications have previously improved energy and quality of life, enabling participation in activities such as picking up her children from school  Mood and anxiety symptoms - Currently taking Rexulti  and Zoloft  200 mg for mood and anxiety - Anxiety and depression are generally manageable with current regimen - Occasional low mood days  Chronic pain and autoimmune disease management - Diagnosed with lupus, currently taking Plaquenil  and Lyrica  - No specific lupus-related symptoms detailed at this visit  Medication adherence and monitoring - Currently taking multiple medications: Rexulti , cholesterol medication, Depo-Provera , Zoloft  200 mg, Suboxone, Plaquenil , and Lyrica  - Receiving Suboxone management through [facility] in [place], [place]  - Undergoes drug testing every two weeks to  ensure compliance with prescribed medications; drug tests do not screen for marijuana or ketamine           05/22/2024   10:39 AM 11/10/2023    3:42 PM 08/14/2023    2:25 PM 05/01/2023    3:07 PM 04/13/2023   10:43 AM  Depression screen PHQ 2/9  Decreased Interest 1 2 2 2 2   Down, Depressed, Hopeless 1 2 2 1 1   PHQ - 2 Score 2 4 4 3 3   Altered sleeping 3 2 3 3 3   Tired, decreased energy 3 2 3 3 3   Change in appetite 3 2 3 3 3   Feeling bad or failure about yourself  0 1 1 1 1   Trouble concentrating 1 1 2 2 2   Moving slowly or fidgety/restless 0 1 1 2 2   Suicidal thoughts 0 0 0 0 0  PHQ-9 Score 12 13  17  17  17    Difficult doing work/chores Somewhat difficult Very difficult Extremely dIfficult Not difficult at all      Data saved with a previous flowsheet row definition      Relevant past medical, surgical, family and social history reviewed and updated as indicated. Interim medical history since our last visit reviewed. Allergies and medications reviewed and updated.  Review of Systems  Constitutional:  Negative for chills and fever.  Eyes:  Negative for redness and visual disturbance.  Respiratory:  Negative for chest tightness and shortness of breath.   Cardiovascular:  Negative for chest pain and leg swelling.  Skin:  Negative for rash.  Neurological:  Negative for dizziness, light-headedness and headaches.  Psychiatric/Behavioral:  Positive for  decreased concentration and dysphoric mood. Negative for agitation, behavioral problems, self-injury, sleep disturbance and suicidal ideas. The patient is nervous/anxious. The patient is not hyperactive.   All other systems reviewed and are negative.   Per HPI unless specifically indicated above   Allergies as of 05/22/2024   No Known Allergies      Medication List        Accurate as of May 22, 2024 11:02 AM. If you have any questions, ask your nurse or doctor.          STOP taking these medications     varenicline  1 MG tablet Commonly known as: CHANTIX  Stopped by: Fonda LABOR Riven Beebe       TAKE these medications    albuterol  108 (90 Base) MCG/ACT inhaler Commonly known as: VENTOLIN  HFA Inhale 2 puffs into the lungs every 6 (six) hours as needed. For shortness of breath   amphetamine -dextroamphetamine  20 MG 24 hr capsule Commonly known as: ADDERALL  XR Take 1 capsule (20 mg total) by mouth every morning. Started by: Fonda LABOR Myeisha Kruser   atorvastatin  80 MG tablet Commonly known as: LIPITOR Take 1 tablet (80 mg total) by mouth daily.   Brexpiprazole  3 MG Tabs Commonly known as: Rexulti  Take 1 tablet (3 mg total) by mouth daily.   cholecalciferol 1000 units tablet Commonly known as: VITAMIN D  Take 1,000 Units by mouth daily.   hydroxychloroquine  200 MG tablet Commonly known as: PLAQUENIL  TAKE ONE TABLET BY MOUTH EVERY DAY   ibuprofen  800 MG tablet Commonly known as: ADVIL  Take 800 mg by mouth every 6 (six) hours as needed.   levothyroxine  25 MCG tablet Commonly known as: SYNTHROID  Take 0.5 tablets (12.5 mcg total) by mouth daily before breakfast. (With the 200 mcg daily,total of 212.5 mcg) What changed: additional instructions Changed by: Fonda LABOR Latavia Goga   levothyroxine  200 MCG tablet Commonly known as: SYNTHROID  Take 1 tablet (200 mcg total) by mouth daily. Take 200+ the 12-1/2 mcg for a total of 212.5 mcg What changed: additional instructions Changed by: Fonda LABOR Lataunya Ruud   magic mouthwash (lidocaine , diphenhydrAMINE , alum & mag hydroxide) suspension Swish and spit 5 mLs 4 (four) times daily as needed for mouth pain.   medroxyPROGESTERone  150 MG/ML injection Commonly known as: DEPO-PROVERA  Inject 1 mL (150 mg total) into the muscle every 3 (three) months.   pregabalin  100 MG capsule Commonly known as: Lyrica  Take 1 capsule (100 mg total) by mouth 2 (two) times daily.   SAPHNELO  IV Inject into the vein every 30 (thirty) days.   sertraline  100 MG  tablet Commonly known as: ZOLOFT  Take 2 tablets (200 mg total) by mouth daily.   Suboxone 4-1 MG Film Generic drug: Buprenorphine HCl-Naloxone HCl Place 4 mg of opioid under the tongue 3 (three) times daily.         Objective:   BP 112/80   Pulse 100   Ht 5' 7 (1.702 m)   Wt 196 lb (88.9 kg)   SpO2 96%   BMI 30.70 kg/m   Wt Readings from Last 3 Encounters:  05/22/24 196 lb (88.9 kg)  04/03/24 204 lb (92.5 kg)  12/27/23 193 lb (87.5 kg)    Physical Exam Vitals and nursing note reviewed.  Constitutional:      General: She is not in acute distress.    Appearance: She is well-developed. She is not diaphoretic.  Eyes:     Conjunctiva/sclera: Conjunctivae normal.  Cardiovascular:     Rate and Rhythm: Normal rate and regular  rhythm.     Heart sounds: Normal heart sounds. No murmur heard. Pulmonary:     Effort: Pulmonary effort is normal. No respiratory distress.     Breath sounds: Normal breath sounds. No wheezing.  Musculoskeletal:        General: No swelling.  Skin:    General: Skin is warm and dry.     Findings: No rash.  Neurological:     Mental Status: She is alert and oriented to person, place, and time.     Coordination: Coordination normal.  Psychiatric:        Behavior: Behavior normal.                 Assessment & Plan:   Problem List Items Addressed This Visit       Endocrine   Hypothyroidism - Primary   Relevant Medications   levothyroxine  (SYNTHROID ) 25 MCG tablet   levothyroxine  (SYNTHROID ) 200 MCG tablet   Other Relevant Orders   Thyroid  Panel With TSH     Other   GAD (generalized anxiety disorder)   Relevant Orders   CBC With Diff/Platelet   CMP14+EGFR   Adult ADHD   Relevant Medications   amphetamine -dextroamphetamine  (ADDERALL  XR) 20 MG 24 hr capsule   Other Relevant Orders   ToxASSURE Select 13 (MW), Urine   Major depression, recurrent   Hyperlipidemia   Relevant Orders   CBC With Diff/Platelet   CMP14+EGFR   Lipid  panel   Systemic lupus (HCC)       Postprocedural hypothyroidism Recent medication mix-up corrected. Current dosing of 200 mcg plus 12.5 mcg levothyroxine  is appropriate. - Ensured pharmacy dispenses correct levothyroxine  dosage.  Systemic lupus erythematosus Managed with Plaquenil . Fatigue persists; stimulant use considered after reviewing records. - Review chart and previous records for potential stimulant prescription.  Mixed hyperlipidemia Cholesterol levels checked today to assess status. - Checked cholesterol levels.  Depression and generalized anxiety disorder Managed with Zoloft  and Rexulti . Mood stable, situational low mood noted. Thyroid  medication mix-up may have affected mood. - Continue Zoloft  and Rexulti .  Chronic pain syndrome - Continue Lyrica  as prescribed.  Opioid dependence on maintenance therapy Managed with Suboxone. Regular drug testing every two weeks. Monitoring prescribed medications. - Ensure [facility] provider sends drug test results to primary care provider.  Contraceptive management (Depo-Provera ) Ongoing management with Depo-Provera . Cycle control satisfactory. - Continue Depo-Provera  injections as scheduled.      Will start Adderall  for adult ADHD at a low dose and see how she does.  Will do a urine drug screen and contract today and repeat in 4 weeks.    Follow up plan: Return in about 4 weeks (around 06/19/2024), or if symptoms worsen or fail to improve, for Adult ADHD recheck.  Counseling provided for all of the vaccine components Orders Placed This Encounter  Procedures   CBC With Diff/Platelet   CMP14+EGFR   Lipid panel   Thyroid  Panel With TSH   ToxASSURE Select 13 (MW), Urine    Fonda Levins, MD [facility] 05/22/2024, 11:02 AM

## 2024-05-23 LAB — CBC WITH DIFF/PLATELET
Basophils Absolute: 0.1 x10E3/uL (ref 0.0–0.2)
Basos: 1 %
EOS (ABSOLUTE): 0.3 x10E3/uL (ref 0.0–0.4)
Eos: 3 %
Hematocrit: 44.6 % (ref 34.0–46.6)
Hemoglobin: 14.7 g/dL (ref 11.1–15.9)
Immature Grans (Abs): 0 x10E3/uL (ref 0.0–0.1)
Immature Granulocytes: 0 %
Lymphocytes Absolute: 4 x10E3/uL — ABNORMAL HIGH (ref 0.7–3.1)
Lymphs: 39 %
MCH: 30 pg (ref 26.6–33.0)
MCHC: 33 g/dL (ref 31.5–35.7)
MCV: 91 fL (ref 79–97)
Monocytes Absolute: 0.4 x10E3/uL (ref 0.1–0.9)
Monocytes: 4 %
Neutrophils Absolute: 5.4 x10E3/uL (ref 1.4–7.0)
Neutrophils: 53 %
Platelets: 362 x10E3/uL (ref 150–450)
RBC: 4.9 x10E6/uL (ref 3.77–5.28)
RDW: 15 % (ref 11.7–15.4)
WBC: 10.2 x10E3/uL (ref 3.4–10.8)

## 2024-05-23 LAB — CMP14+EGFR
ALT: 8 IU/L (ref 0–32)
AST: 15 IU/L (ref 0–40)
Albumin: 4.5 g/dL (ref 3.9–4.9)
Alkaline Phosphatase: 86 IU/L (ref 41–116)
BUN/Creatinine Ratio: 8 — AB (ref 9–23)
BUN: 7 mg/dL (ref 6–20)
Bilirubin Total: 0.5 mg/dL (ref 0.0–1.2)
CO2: 21 mmol/L (ref 20–29)
Calcium: 8 mg/dL — AB (ref 8.7–10.2)
Chloride: 101 mmol/L (ref 96–106)
Creatinine, Ser: 0.93 mg/dL (ref 0.57–1.00)
Globulin, Total: 2.6 g/dL (ref 1.5–4.5)
Glucose: 94 mg/dL (ref 70–99)
Potassium: 3.9 mmol/L (ref 3.5–5.2)
Sodium: 141 mmol/L (ref 134–144)
Total Protein: 7.1 g/dL (ref 6.0–8.5)
eGFR: 80 mL/min/1.73 (ref >59)

## 2024-05-23 LAB — THYROID PANEL WITH TSH
Free Thyroxine Index: 1.4 (ref 1.2–4.9)
T3 Uptake Ratio: 20 % — AB (ref 24–39)
T4, Total: 7.1 ug/dL (ref 4.5–12.0)
TSH: 24.1 u[IU]/mL — AB (ref 0.450–4.500)

## 2024-05-23 LAB — LIPID PANEL
Cholesterol, Total: 160 mg/dL (ref 100–199)
HDL: 34 mg/dL — AB (ref >39)
LDL CALC COMMENT:: 4.7 ratio — AB (ref 0.0–4.4)
LDL Chol Calc (NIH): 106 mg/dL — AB (ref 0–99)
Triglycerides: 111 mg/dL (ref 0–149)
VLDL Cholesterol Cal: 20 mg/dL (ref 5–40)

## 2024-05-25 LAB — TOXASSURE SELECT 13 (MW), URINE

## 2024-05-27 ENCOUNTER — Ambulatory Visit: Payer: Self-pay | Admitting: Family Medicine

## 2024-05-27 ENCOUNTER — Other Ambulatory Visit (HOSPITAL_COMMUNITY): Payer: Self-pay

## 2024-05-27 MED ORDER — ADDERALL XR 20 MG PO CP24
20.0000 mg | ORAL_CAPSULE | Freq: Every day | ORAL | 0 refills | Status: DC
  Filled 2024-05-27: qty 30, 30d supply, fill #0

## 2024-05-29 ENCOUNTER — Other Ambulatory Visit (HOSPITAL_COMMUNITY): Payer: Self-pay

## 2024-06-04 ENCOUNTER — Other Ambulatory Visit (HOSPITAL_COMMUNITY): Payer: Self-pay

## 2024-06-05 ENCOUNTER — Other Ambulatory Visit (HOSPITAL_COMMUNITY): Payer: Self-pay

## 2024-06-05 DIAGNOSIS — M329 Systemic lupus erythematosus, unspecified: Secondary | ICD-10-CM | POA: Diagnosis not present

## 2024-06-14 ENCOUNTER — Ambulatory Visit: Admitting: Family Medicine

## 2024-06-14 ENCOUNTER — Other Ambulatory Visit (HOSPITAL_COMMUNITY): Payer: Self-pay

## 2024-06-14 VITALS — BP 108/73 | HR 81 | Ht 67.0 in | Wt 203.0 lb

## 2024-06-14 DIAGNOSIS — F909 Attention-deficit hyperactivity disorder, unspecified type: Secondary | ICD-10-CM | POA: Diagnosis not present

## 2024-06-14 DIAGNOSIS — E890 Postprocedural hypothyroidism: Secondary | ICD-10-CM

## 2024-06-14 LAB — THYROID PANEL WITH TSH
Free Thyroxine Index: 2 (ref 1.2–4.9)
T3 Uptake Ratio: 23 % — ABNORMAL LOW (ref 24–39)
T4, Total: 8.9 ug/dL (ref 4.5–12.0)
TSH: 36 u[IU]/mL — ABNORMAL HIGH (ref 0.450–4.500)

## 2024-06-14 MED ORDER — ADDERALL XR 20 MG PO CP24
20.0000 mg | ORAL_CAPSULE | Freq: Every day | ORAL | 0 refills | Status: DC
  Filled 2024-06-19: qty 30, 30d supply, fill #0

## 2024-06-14 NOTE — Progress Notes (Signed)
 "  BP 108/73   Pulse 81   Ht 5' 7 (1.702 m)   Wt 203 lb (92.1 kg)   SpO2 98%   BMI 31.79 kg/m    Subjective:   Patient ID: Valerie Ayala, female    DOB: [birth year], 38 y.o.   MRN: [id number]  HPI: Valerie Ayala is a 38 y.o. female presenting on 06/14/2024 for Medical Management of Chronic Issues (5m Adderall  f/u)   Discussed the use of AI scribe software for clinical note transcription with the patient, who gave verbal consent to proceed.  History of Present Illness   Valerie Ayala is a 38 year old female who presents for a recheck of her thyroid  levels.  Thyroid  dysfunction - Recently restarted thyroid  medication at doses of 200 mcg and 12.5 mcg - Mild improvement in symptoms since resuming medication  Excessive daytime sleepiness - Persistent sleepiness despite starting Adderall  approximately one week ago - Adderall  provides some improvement in focus and task completion, but sleepiness remains difficult to manage  Substance use and medication-assisted treatment - Continues to attend a Suboxone clinic - Recent drug screen positive for THC, attributed to use of legal THC products purchased at a gas station in [place].  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 Interim medical history since our last visit reviewed. Allergies and medications reviewed and updated.  Review of Systems  Constitutional:  Negative for chills and fever.  Eyes:  Negative  for visual disturbance.  Respiratory:  Negative for chest tightness and shortness of breath.   Cardiovascular:  Negative for chest pain and leg swelling.  Genitourinary:  Negative for difficulty urinating and dysuria.  Musculoskeletal:  Negative for back pain and gait problem.  Skin:  Negative for rash.  Neurological:  Negative for dizziness, light-headedness and headaches.  Psychiatric/Behavioral:  Negative for agitation and behavioral problems.   All other systems reviewed and are negative.   Per HPI unless specifically indicated above   Allergies as of 06/14/2024   No Known Allergies      Medication List        Accurate as of June 14, 2024  9:26 AM. If you have any questions, ask your nurse or doctor.          Adderall  XR 20 MG 24 hr capsule Generic drug: amphetamine -dextroamphetamine  Take 1 capsule (20 mg total) by mouth daily. Start taking on: June 19, 2024 What changed: These instructions start on June 19, 2024. If you are unsure what to do until then, ask your doctor or other care provider. Changed by: Fonda Levins, MD   albuterol  [phone] Base) MCG/ACT inhaler Commonly known as: VENTOLIN  HFA Inhale 2 puffs into the lungs every 6 (six) hours as needed. For shortness of breath   atorvastatin  80 MG tablet Commonly known as: LIPITOR Take 1 tablet (80 mg total) by mouth daily.   Brexpiprazole  3 MG Tabs Commonly known as: Rexulti  Take 1 tablet (3 mg total) by mouth daily.   cholecalciferol 1000 units tablet Commonly  known as: VITAMIN D  Take 1,000 Units by mouth daily.   hydroxychloroquine  200 MG tablet Commonly known as: PLAQUENIL  TAKE ONE TABLET BY MOUTH EVERY DAY   ibuprofen  800 MG tablet Commonly known as: ADVIL  Take 800 mg by mouth every 6 (six) hours as needed.   levothyroxine  25 MCG tablet Commonly known as: SYNTHROID  Take 0.5 tablets (12.5 mcg total) by mouth daily before breakfast. (With the 200 mcg daily,total of 212.5 mcg)    levothyroxine  200 MCG tablet Commonly known as: SYNTHROID  Take 1 tablet (200 mcg total) by mouth daily. Take 200+ the 12-1/2 mcg for a total of 212.5 mcg   magic mouthwash (lidocaine , diphenhydrAMINE , alum & mag hydroxide) suspension Swish and spit 5 mLs 4 (four) times daily as needed for mouth pain.   medroxyPROGESTERone  150 MG/ML injection Commonly known as: DEPO-PROVERA  Inject 1 mL (150 mg total) into the muscle every 3 (three) months.   pregabalin  100 MG capsule Commonly known as: Lyrica  Take 1 capsule (100 mg total) by mouth 2 (two) times daily.   SAPHNELO  IV Inject into the vein every 30 (thirty) days.   sertraline  100 MG tablet Commonly known as: ZOLOFT  TAKE TWO TABLETS BY MOUTH EVERY DAY   Suboxone 4-1 MG Film Generic drug: Buprenorphine HCl-Naloxone HCl Place 4 mg of opioid under the tongue 3 (three) times daily.         Objective:   BP 108/73   Pulse 81   Ht 5' 7 (1.702 m)   Wt 203 lb (92.1 kg)   SpO2 98%   BMI 31.79 kg/m   Wt Readings from Last 3 Encounters:  06/14/24 203 lb (92.1 kg)  05/22/24 196 lb (88.9 kg)  04/03/24 204 lb (92.5 kg)    Physical Exam Physical Exam   NECK: Thyroid  normal, no masses palpable. CHEST: Lungs clear to auscultation bilaterally. CARDIOVASCULAR: Heart regular rate and rhythm.         Assessment & Plan:   Problem List Items Addressed This Visit       Endocrine   Hypothyroidism   Relevant Orders   Thyroid  Panel With TSH     Other   Adult ADHD - Primary   Relevant Medications   ADDERALL  XR 20 MG 24 hr capsule (Start on 06/19/2024)       Postoperative hypothyroidism Thyroid  function was previously abnormal. She restarted medication with 200 mcg and 12.5 mcg doses. Reports improvement. - Rechecked thyroid  function today. - Adjust medication dosage based on results.  Adult attention-deficit hyperactivity disorder Started Adderall  one week ago. Reports improved focus but significant sleepiness  persists.  Opioid dependence on maintenance therapy Attends Suboxone clinic. Recent drug screen showed THC presence. Discussed THC interactions with Suboxone and ADHD medications. - Advised to avoid THC products due to potential interactions. - Encouraged use of non-THC alternatives.  General health maintenance Due for a mammogram. Discussed mobile mammogram services. - Schedule mammogram. - Consider mobile mammogram services if preferred.          Follow up plan: Return in about 5 weeks (around 07/19/2024), or if symptoms worsen or fail to improve, for ADHD.  Counseling provided for all of the vaccine components Orders Placed This Encounter  Procedures   Thyroid  Panel With TSH    Fonda Levins, MD [facility] 06/14/2024, 9:26 AM     "

## 2024-06-19 ENCOUNTER — Other Ambulatory Visit (HOSPITAL_COMMUNITY): Payer: Self-pay

## 2024-06-24 ENCOUNTER — Other Ambulatory Visit (HOSPITAL_COMMUNITY): Payer: Self-pay

## 2024-06-24 ENCOUNTER — Ambulatory Visit: Payer: Self-pay | Admitting: Family Medicine

## 2024-06-24 ENCOUNTER — Other Ambulatory Visit: Payer: Self-pay

## 2024-06-24 DIAGNOSIS — E890 Postprocedural hypothyroidism: Secondary | ICD-10-CM

## 2024-06-24 MED ORDER — LEVOTHYROXINE SODIUM 25 MCG PO TABS
25.0000 ug | ORAL_TABLET | Freq: Every day | ORAL | 3 refills | Status: DC
  Filled 2024-06-24: qty 30, 30d supply, fill #0

## 2024-06-25 ENCOUNTER — Other Ambulatory Visit (HOSPITAL_COMMUNITY): Payer: Self-pay

## 2024-07-01 NOTE — Progress Notes (Unsigned)
 "  Office Visit Note  Patient: Valerie Ayala             Date of Birth: 08-04-1985           MRN: [id number]             PCP: Dettinger, Fonda LABOR, MD Referring: Dettinger, Fonda LABOR, MD Visit Date: 07/09/2024   Subjective:  No chief complaint on file.   History of Present Illness: Valerie Ayala is a 39 y.o. female here for follow up for systemic lupus currently on treatment with Saphnelo  infusions 300 mg IV monthly and HCQ 200 mg daily.    Previous HPI 04/03/2024 Valerie Ayala is a 39 y.o. female here for follow up for systemic lupus currently on treatment with Saphnelo  infusions 300 mg IV monthly and HCQ 200 mg daily.     She experiences severe fatigue, describing it as feeling 'tired and tired and tired' with associated arthralgia in her elbows and knees. She has gained 70 pounds, complains of gain primarily in the abdominal area, and notes a 'moon face' appearance. She is concerned about the possibility of Cushing's syndrome, as her mother had similar symptoms. No recent steroid use, with the last prednisone  use about a year ago. No unusual supplements taken.   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 Her recent calcium  level was 7.4 mg/dL. She takes over-the-counter calcium  and vitamin D  supplements but feels it is insufficient. She has not seen an endocrinologist in years, and her primary care doctor manages her thyroid  medication.   Her thyroid  function was previously affected by a pharmacy error, where she was given 25 micrograms instead of the prescribed 225 micrograms of her thyroid  medication. She has been on the correct dosage for about three months now.   She has a history of ADHD and previously used Adderall  but stopped due to heart palpitations. She uses cannabis for pain management, which affects her ability to obtain Adderall .       Previous HPI 12/27/2023 Valerie Ayala is a 39 y.o. female here for follow up for  systemic lupus currently on treatment with Saphnelo  infusions 300 mg IV monthly.     She has been experiencing persistent and worsening angular cheilitis. Recent laboratory results revealed a low level of vitamin B2 (riboflavin ). She is borderline iron deficient as well.   She has been receiving infusions for lupus, which have led to partial improvement in her lab markers, specifically a decrease in her double-stranded DNA titer. Despite this, she does not feel significantly better post-infusions. She experiences unpredictable leg swelling, which has been less frequent recently. She has gained weight, although she reports eating infrequently, sometimes only once a day, and has not noticed a change in her activity level.   This morning, she experienced chest pain for the first time, located centrally, lasting about thirty minutes. She initially thought she was having a heart attack. She also reports swelling in her hands, making it difficult to remove her rings. She experiences occasional acid reflux, for which she uses Tums, but does not take regular medication for it.   She has been experiencing severe itching on her legs, arms, and back, along with dry skin. She has not noticed any particular changes in her environment that could account for the dry skin.         Previous HPI 08/08/2023 Valerie Ayala is a 39 y.o. female here for follow  up for systemic lupus currently on treatment with Saphnelo  infusions 300 mg IV monthly.     Over the past two to three months, she has experienced worsening symptoms, including persistent flu-like symptoms, fatigue, and generalized pain. Despite recent lab results from November showing improvement in lupus markers, she feels her condition is deteriorating.   She describes significant leg swelling, particularly in the left leg. This problem is recurrent, had been worse in the past but trending worse now. The swelling worsens with physical activity, such as cleaning or  walking, and is accompanied by pain. She takes vitamin D  daily but not calcium  supplements.   Severe fatigue and muscle weakness are present, with minimal physical activity, like vacuuming, resulting in prolonged pain and inability to raise her arms. Her muscles feel depleted, and she struggles with walking, often needing support to prevent falling.   She experiences frequent urination without pain or blood, feeling the need to urinate constantly with little output each time. No symptoms typical of a bladder infection are present.   She has a history of constipation but experienced diarrhea following a recent flu episode. No nausea is present, but she notes a generally dry mouth.   Persistent itching, particularly after leg swelling subsides, leads to skin damage from scratching. No recent mouth sores, but she reports dry skin and mouth, contributing to angular cheilitis.   She has been taking Lyrica  for pain management but finds it ineffective. Recently increased to 200 mg BID. She previously    She mentions a history of shingles and has received the first dose of the Shingrix  vaccine, with the second dose planned soon.     Previous HPI 05/08/2023 Valerie Ayala is a 38 y.o. female here for follow up  for systemic lupus currently on treatment with Saphnelo  infusions monthly.  She saw Dr. Urbano for management of chronic fibromyalgia and myofascial pain most recently addition of Lyrica  75 mg.  Overall she feels symptoms are generally in exacerbation.  She had a very stressful event when her son was in the hospital as a passenger in motor vehicle collision.  For several months has increased symptoms of concentration and short-term memory difficulty, unintentional weight gain, and fatigue.  Is also experiencing urinary frequency with occasional stress incontinence.  Occasional incontinence has been the case for years but the urgency is just a recent development.  She describes it as feeling like  she has a UTI but without any pain.   Previous HPI 02/02/2023 Valerie Ayala is a 39 y.o. female here for follow up for systemic lupus currently on treatment with Saphnelo  infusions monthly first dose on March 6 second infusion slightly delayed to April 16 for perioperative management.  Overall she feels better than earlier in this year but she thinks there is less benefit after the more recent Saphnelo  infusions compared to after her first 2 drug treatments.  She had 1 episode of extremely painful oral ulcers since the last visit which were a new symptom for her.  Leg swelling has been intermittent not too severe and without associated skin changes.  Worse problems currently just generalized fatigue and bodyaches with limited exertion tolerance.   Previous HPI 11/04/2022 ERICIA MOXLEY is a 39 y.o. female here for follow up for systemic lupus currently on treatment with Saphnelo  infusions monthly first dose on March 6 second infusion slightly delayed to April 16 for perioperative management.  She reports noticing a definite change after infusion with improvement in her skin rashes  swelling though she remains very fatigued.  Reports some waning of the benefit she noticed during the last week or 2 prior to repeat infusion.  But currently doing still doing pretty well from treatment on the 16th of last month.  She has questions about naloxone that she read read about regarding management of her fibromyalgia due to persistent body aches and fatigue.   Previous HPI 08/05/22 MARTINE BLEECKER is a 39 y.o. female here for follow up for systemic lupus currently off treatment due to worsening in LFTs and lack of symptom response. She continues to have joint pain and swelling in hands and feet and skin rashes.  Raynaud's symptoms are worse during wintertime so far but not having any blistering or skin peeling changes.  Not much swelling back in her legs and no new mouth or nose ulcers.  Very severe fatigue is her most  problematic symptom feels like she can sleep for 12 hours or more without having improvement in energy she has been referred for sleep study.   Previous HPI 06/02/22  WAJIHA VERSTEEG is a 39 y.o. female here for follow up for SLE after last visit discontinued benlysta  due to loss of efficacy and starting methotrexate  15 mg PO weekly and folic acid  1 mg daily and continuing HCQ 300 mg daily. Also 2 week prednisone  taper for general exacerbation of symptoms at that time likely triggered by preceding URI. Since then she continues to feel considerable joint pain and stiffness. Most affected in her bilateral shoulders and knees but also with hand pain limiting some activities such as coloring. Symptoms are worst overnight and for a few hours each morning. She had at least one episode of bilateral leg swelling lasting several days.    Previous HPI 03/22/22 KESLEY MULLENS is a 39 y.o. female here for follow up for SLE on HCQ 300 mg daily and benlysta  200 mg Olla weekly. She is currently feeling worse pretty much all over. Especially joint pains and fatigue and increased skin rashes. She had an upper respiratory illness recently she recovered since about a week ago but has other symptoms are all worse since then. Leg swelling is doing okay and no new mouth and nose ulcers.   Previous HPI 10/14/2021 NEFERTARI REBMAN is a 39 y.o. female here for follow up for SLE on HCQ 300 mg daily and Benlysta  200 mg [place] weekly. At last visit significant worsening in lower extremity swelling but normal lab markers and negative for proteinuria.  Since the last visit he continues to do very poorly.  She has ongoing leg swelling itching sometimes there is redness discoloration.  Newer problem is feeling some proximal leg weakness occasional sense of her legs giving out or locking up when walking.  She is extremely fatigued almost every day.  Sometimes she cannot even get out of bed for hours and her mood is very poor due to decreased ability to do  anything.  She is also been noticing some palpitations and lightheadedness getting short of breath with very brief or mild exertion.     Previous HPI 06/15/21 LAFREDA CASEBEER is a 39 y.o. female here for follow up for SLE on HCQ 300 mg daily and Benlysta  200 mg Harris weekly.  She was doing very well after our last visit until about a month ago now feels everything is horrible.  She has significant bilateral leg swelling with pitting edema developing increased tenderness and painful burning sensation with discolorations and skin changes  in both legs.  She feels very fatigued.  Some skin dryness and peeling on the hands but no significant erythematous rash outside of the lower legs.  She recently also had flu with generalized body pains prescribed very large anterior cervical adenopathy.  This improved though she still has a residual ulcer in the mouth. Labs earlier this month showing very low TSH level with recent slight downward adjustment of her thyroid  replacement.  She quit smoking with use of Chantix .   Previous HPI 12/08/20 THELDA GAGAN is a 39 y.o. female here for follow up for systemic lupus on hydroxychloroquine  300 mg p.o. daily. No signfiicant improvement since starting hydroxychloroquine  8 weeks ago.  She has not yet seen ophthalmology for retinal exam but plans to do so. Leg swelling continues to come and go, residual erythema and pain and burning sensations.  She continues to feel extremely fatigued as the most noticeable symptom.  She is also had a few mouth sores headaches and swollen glands but also describes the ongoing leg swelling sometimes with pain and discoloration.  Currently her left distal leg is most affected today.   Lupus manifestations Fatigue Arthralgias Photosensitive rash Oral ulcers Raynaud's   Lupus serology ANA 1:80 speckled dsDNA 49 Complement C3, C4 wnl Vit D 19   DMARD Hx Saphnelo  08/2022-current   No Rheumatology ROS completed.   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   Fibromyalgia 08/08/2022   ASCUS with positive high risk HPV cervical 09/15/2021   History of abnormal cervical Pap smear 09/08/2021   Swelling of lower extremity 12/08/2020   High risk medication use 12/08/2020   Hyperlipidemia 12/03/2020   Hypocalcemia 09/07/2020   Vitamin D  deficiency 09/07/2020   Systemic lupus (HCC) 08/21/2020   Dry mouth 08/21/2020   Tobacco use disorder 08/21/2020   Arthralgia 08/21/2020   Raynaud's phenomenon 08/21/2020   Major depression, recurrent 04/26/2018   Adult ADHD 12/16/2014   Asthma 05/07/2013   Hypothyroidism 11/26/2012   GAD (generalized anxiety disorder) 11/26/2012    Past Medical History:  Diagnosis Date   Anxiety    Takes xanax  twice daily   ASCUS with positive high risk HPV cervical 09/15/2021   08/2021 +HPV 16/0ther ASCUS, needs colpo, immediate risk CIN3+ 9%   Asthma    Chronic headaches    multiple times a day; they say it's related to thyroid    Complication of anesthesia    Dysrhythmia    tachycardia   Fibromyalgia    Grave's disease    H/O seasonal allergies    Headache(784.0)    most days   Pneumonia 11/02/2011   years ago   PONV (postoperative nausea and vomiting)    Systemic lupus erythematosus (HCC)    Thyroid  disease     Family History  Problem Relation Age of Onset   Hypertension Mother    Hyperlipidemia Mother    Diabetes Mother    Sleep apnea Mother    Hypertension Father    Hyperlipidemia Father    Hypothyroidism Father    Healthy Sister    Anesthesia problems Neg Hx    Hypotension Neg Hx    Malignant hyperthermia Neg Hx    Pseudochol deficiency Neg Hx    Past Surgical History:  Procedure Laterality Date   teeth pulled     back teeth   THYROIDECTOMY  11/02/2011   Procedure: THYROIDECTOMY;  Surgeon: Ida Loader, MD;  Location: [facility] OR;  Service: ENT;  Laterality: Bilateral;  TOTAL THYROIDECTOMY   TOTAL THYROIDECTOMY   11/02/2011   WISDOM TOOTH EXTRACTION  06/27/2002   Social History   Social History Narrative   Not on file   Immunization History  Administered Date(s) Administered   DTaP 08/24/1984, 11/05/1984, 12/31/1984, 12/24/1985   Hepatitis B 04/26/1996, 05/31/1996, 08/30/1996   IPV 08/24/1984, 11/05/1984, 12/24/1985   Influenza, Seasonal, Injecte, Preservative Fre 05/18/2023, 05/22/2024   Influenza,inj,Quad PF,6+ Mos 05/25/2020, 05/27/2021, 03/24/2022   Influenza-Unspecified 06/06/2011, 04/25/2012   MMR 12/24/1985   PNEUMOCOCCAL CONJUGATE-20 11/13/2023   PPD Test 03/16/2015, 03/23/2015   Pneumococcal Polysaccharide-23 11/03/2011   Tdap 12/10/2010, 07/28/2021   Zoster Recombinant(Shingrix ) 05/01/2023, 08/14/2023     Objective: Vital Signs: There were no vitals taken for this visit.   Physical Exam   Musculoskeletal Exam: ***  CDAI Exam: CDAI Score: -- Patient Global: --; Provider Global: -- Swollen: --; Tender: -- Joint Exam 07/09/2024   No joint exam has been documented for this visit   There is currently no information documented on the homunculus. Go to the Rheumatology activity and complete the homunculus joint exam.  Investigation: No additional findings.  Imaging: No results found.  Recent Labs: Lab Results  Component Value Date   WBC 10.2 05/22/2024   HGB 14.7 05/22/2024   PLT 362 05/22/2024   NA 141 05/22/2024   K 3.9 05/22/2024   CL 101 05/22/2024   CO2 21 05/22/2024   GLUCOSE 94 05/22/2024   BUN 7 05/22/2024   CREATININE 0.93 05/22/2024   BILITOT 0.5 05/22/2024   ALKPHOS 86 05/22/2024   AST 15 05/22/2024   ALT 8 05/22/2024   PROT 7.1 05/22/2024   ALBUMIN 4.5 05/22/2024   CALCIUM  8.0 (L) 05/22/2024   GFRAA 80 02/27/2020   QFTBGOLDPLUS NEGATIVE 12/08/2020    Speciality Comments: No specialty comments available.  Procedures:  No procedures performed Allergies: Patient has no known allergies.   Assessment / Plan:     Visit Diagnoses: No  diagnosis found.  ***  Orders: No orders of the defined types were placed in this encounter.  No orders of the defined types were placed in this encounter.    Follow-Up Instructions: No follow-ups on file.   Sahej Hauswirth M Bryse Blanchette, CMA  Note - This record has been created using Animal nutritionist.  Chart creation errors have been sought, but may not always  have been located. Such creation errors do not reflect on  the standard of medical care. "

## 2024-07-02 ENCOUNTER — Telehealth: Payer: Self-pay | Admitting: *Deleted

## 2024-07-02 NOTE — Telephone Encounter (Signed)
 Treatment plan for Saphnelo  printed for [facility]. Will need Dr. Joyce signature.  Sherry Pennant, PharmD, MPH, BCPS, CPP Clinical Pharmacist

## 2024-07-02 NOTE — Telephone Encounter (Signed)
 Received a call from [facility]. They advised they need new orders from patient's Saphnelo  infusion. Patient has an appointment on 07/05/2024. If they do not receive them they will have to cancel the appointment.

## 2024-07-04 NOTE — Telephone Encounter (Signed)
 Dr Jeannetta signed this paperwork and Reena faxed it either Monday or Tuesday

## 2024-07-04 NOTE — Telephone Encounter (Signed)
 I do apologize about the confusion. Reena had faxed it to the 800# that was on the paperwork and not to the [employer] for [facility], she is sending it and the office note to the [place] fax for you guys now.

## 2024-07-04 NOTE — Telephone Encounter (Signed)
 Faxed Saphnelo  referral with OV notes to [facility]   Phone: [phone] Fax: [phone]

## 2024-07-09 ENCOUNTER — Ambulatory Visit: Admitting: Internal Medicine

## 2024-07-09 DIAGNOSIS — Z79899 Other long term (current) drug therapy: Secondary | ICD-10-CM

## 2024-07-09 DIAGNOSIS — M797 Fibromyalgia: Secondary | ICD-10-CM

## 2024-07-09 DIAGNOSIS — M329 Systemic lupus erythematosus, unspecified: Secondary | ICD-10-CM

## 2024-07-15 ENCOUNTER — Other Ambulatory Visit: Payer: Self-pay

## 2024-07-15 ENCOUNTER — Encounter: Payer: Self-pay | Admitting: Family Medicine

## 2024-07-15 ENCOUNTER — Other Ambulatory Visit (HOSPITAL_COMMUNITY): Payer: Self-pay

## 2024-07-15 ENCOUNTER — Ambulatory Visit (INDEPENDENT_AMBULATORY_CARE_PROVIDER_SITE_OTHER): Admitting: Family Medicine

## 2024-07-15 ENCOUNTER — Other Ambulatory Visit (HOSPITAL_BASED_OUTPATIENT_CLINIC_OR_DEPARTMENT_OTHER): Payer: Self-pay

## 2024-07-15 VITALS — BP 123/76 | HR 111 | Ht 67.0 in | Wt 200.0 lb

## 2024-07-15 DIAGNOSIS — E890 Postprocedural hypothyroidism: Secondary | ICD-10-CM

## 2024-07-15 DIAGNOSIS — Z1231 Encounter for screening mammogram for malignant neoplasm of breast: Secondary | ICD-10-CM

## 2024-07-15 DIAGNOSIS — F909 Attention-deficit hyperactivity disorder, unspecified type: Secondary | ICD-10-CM

## 2024-07-15 MED ORDER — VARENICLINE TARTRATE (STARTER) 0.5 MG X 11 & 1 MG X 42 PO TBPK
ORAL_TABLET | ORAL | 0 refills | Status: AC
  Filled 2024-07-15: qty 42, 30d supply, fill #0
  Filled 2024-07-15: qty 53, 30d supply, fill #0
  Filled 2024-07-15: qty 42, 30d supply, fill #0

## 2024-07-15 MED ORDER — VARENICLINE TARTRATE 1 MG PO TABS
1.0000 mg | ORAL_TABLET | Freq: Two times a day (BID) | ORAL | 1 refills | Status: AC
  Filled 2024-07-15 (×3): qty 60, 30d supply, fill #0

## 2024-07-15 MED ORDER — ADDERALL XR 20 MG PO CP24
20.0000 mg | ORAL_CAPSULE | Freq: Every day | ORAL | 0 refills | Status: DC
  Filled 2024-07-15: qty 30, 30d supply, fill #0

## 2024-07-15 NOTE — Progress Notes (Signed)
 "  BP 123/76   Pulse (!) 111   Ht 5' 7 (1.702 m)   Wt 200 lb (90.7 kg)   SpO2 100%   BMI 31.32 kg/m    Subjective:   Patient ID: Valerie Ayala, female    DOB: 1985-10-01, 39 y.o.   MRN: [id number]  HPI: Valerie Ayala is a 39 y.o. female presenting on 07/15/2024 for Medical Management of Chronic Issues and ADHD   Discussed the use of AI scribe software for clinical note transcription with the patient, who gave verbal consent to proceed.  History of Present Illness   Valerie Ayala is a 39 year old female who presents for a recheck of her thyroid  medication and follow-up on her Adderall  prescription.  Thyroid  dysfunction - Currently taking 225 mcg of thyroid  medication daily. - Rarely misses doses due to use of a weekly pill box. - Thyroid  levels have not been completely stable in the past.  Attention deficit hyperactivity disorder (adhd) management - Following up on Adderall  prescription. - Previous drug screen showed marijuana use. - Has used marijuana for 25 years and is attempting cessation, but acknowledges it may still be present in her system.  Mood disorder management - Taking Zoloft  and Rexulti  for mental health. - Feels maxed out on Zoloft  and could feel better, though not depressed all the time. - Considering a change in medication; sister recommended Pristiq.  Chronic rhinitis and nasal drainage - Persistent year-round runny nose attributed to allergies and deviated septum. - Experiences drainage that sometimes causes choking sensations. - Has used Flonase in the past and is considering restarting it.  Tobacco use and smoking cessation - Wants to quit smoking. - Previously used Chantix  successfully and quit for six months.  Preventive health maintenance - Due for a mammogram.          Relevant past medical, surgical, family and social history reviewed and updated as indicated. Interim medical history since our last visit reviewed. Allergies and medications  reviewed and updated.  Review of Systems  Constitutional:  Negative for chills and fever.  HENT:  Positive for congestion. Negative for ear discharge and ear pain.   Eyes:  Negative for redness and visual disturbance.  Respiratory:  Positive for cough. Negative for chest tightness and shortness of breath.   Cardiovascular:  Negative for chest pain and leg swelling.  Genitourinary:  Negative for difficulty urinating and dysuria.  Musculoskeletal:  Negative for back pain and gait problem.  Skin:  Negative for rash.  Neurological:  Negative for light-headedness and headaches.  Psychiatric/Behavioral:  Positive for decreased concentration and dysphoric mood. Negative for agitation, behavioral problems, self-injury, sleep disturbance and suicidal ideas. The patient is nervous/anxious.   All other systems reviewed and are negative.   Per HPI unless specifically indicated above   Allergies as of 07/15/2024   No Known Allergies      Medication List        Accurate as of July 15, 2024  9:25 AM. If you have any questions, ask your nurse or doctor.          Adderall  XR 20 MG 24 hr capsule Generic drug: amphetamine -dextroamphetamine  Take 1 capsule (20 mg total) by mouth daily.   albuterol  108 (90 Base) MCG/ACT inhaler Commonly known as: VENTOLIN  HFA Inhale 2 puffs into the lungs every 6 (six) hours as needed. For shortness of breath   atorvastatin  80 MG tablet Commonly known as: LIPITOR Take 1 tablet (80 mg total) by mouth  daily.   Brexpiprazole  3 MG Tabs Commonly known as: Rexulti  Take 1 tablet (3 mg total) by mouth daily.   cholecalciferol 1000 units tablet Commonly known as: VITAMIN D  Take 1,000 Units by mouth daily.   hydroxychloroquine  200 MG tablet Commonly known as: PLAQUENIL  TAKE ONE TABLET BY MOUTH EVERY DAY   ibuprofen  800 MG tablet Commonly known as: ADVIL  Take 800 mg by mouth every 6 (six) hours as needed.   levothyroxine  200 MCG tablet Commonly known  as: SYNTHROID  Take 1 tablet (200 mcg total) by mouth daily. Take 200+ the 12-1/2 mcg for a total of 212.5 mcg   levothyroxine  25 MCG tablet Commonly known as: SYNTHROID  Take 1 tablet (25 mcg total) by mouth daily before breakfast. (With the 200 mcg daily,total of 212.5 mcg)   magic mouthwash (lidocaine , diphenhydrAMINE , alum & mag hydroxide) suspension Swish and spit 5 mLs 4 (four) times daily as needed for mouth pain.   medroxyPROGESTERone  150 MG/ML injection Commonly known as: DEPO-PROVERA  Inject 1 mL (150 mg total) into the muscle every 3 (three) months.   pregabalin  100 MG capsule Commonly known as: Lyrica  Take 1 capsule (100 mg total) by mouth 2 (two) times daily.   SAPHNELO  IV Inject into the vein every 30 (thirty) days.   sertraline  100 MG tablet Commonly known as: ZOLOFT  TAKE TWO TABLETS BY MOUTH EVERY DAY   Suboxone 4-1 MG Film Generic drug: Buprenorphine HCl-Naloxone HCl Place 4 mg of opioid under the tongue 3 (three) times daily.   Varenicline  Tartrate (Starter) 0.5 MG X 11 & 1 MG X 42 Tbpk Commonly known as: Chantix  Starting Month Pak Take 0.5 mg by mouth daily for 3 days, THEN 0.5 mg 2 (two) times daily for 4 days, THEN 1 mg 2 (two) times daily for 23 days. Start taking on: July 15, 2024 Started by: Fonda Levins, MD   varenicline  1 MG tablet Commonly known as: Chantix  Continuing Month Pak Take 1 tablet (1 mg total) by mouth 2 (two) times daily. Started by: Fonda Levins, MD         Objective:   BP 123/76   Pulse (!) 111   Ht 5' 7 (1.702 m)   Wt 200 lb (90.7 kg)   SpO2 100%   BMI 31.32 kg/m   Wt Readings from Last 3 Encounters:  07/15/24 200 lb (90.7 kg)  06/14/24 203 lb (92.1 kg)  05/22/24 196 lb (88.9 kg)    Physical Exam Vitals and nursing note reviewed.  Constitutional:      Appearance: Normal appearance.  Neurological:     Mental Status: She is alert.    Physical Exam   NECK: Cervical lymphadenopathy present. CHEST:  Lungs clear to auscultation. CARDIOVASCULAR: Heart regular rate and rhythm.         Assessment & Plan:   Problem List Items Addressed This Visit       Endocrine   Hypothyroidism   Relevant Orders   Thyroid  Panel With TSH     Other   Adult ADHD - Primary   Relevant Medications   ADDERALL  XR 20 MG 24 hr capsule   Other Relevant Orders   ToxASSURE Select 13 (MW), Urine   Other Visit Diagnoses       Encounter for screening mammogram for malignant neoplasm of breast       Relevant Orders   MM 3D SCREENING MAMMOGRAM BILATERAL BREAST          Screening mammogram Due for routine screening. - Ordered screening mammogram.  Postoperative hypothyroidism Currently on levothyroxine  225 mcg daily. Previous TSH levels suboptimal. Adherent with occasional missed doses. - Continue levothyroxine  225 mcg daily.  Adult attention-deficit/hyperactivity disorder (ADHD) Currently prescribed Adderall . Drug screen for marijuana pending. Discussed legal implications of marijuana use. - Rechecked drug screen for marijuana. - Continue Adderall  for one month. - Advised to cease marijuana use completely by next visit.  Major depressive disorder Currently on Zoloft  and Rexulti . Interested in transitioning to [employer]. Discussed potential for a rough transition period. - Discussed option to transition from Zoloft  to Pristiq with tapering process. - Continue current medications until transition is desired.  Nicotine dependence Previously used Chantix  successfully. Interested in restarting to aid smoking cessation. - Prescribed Chantix  starter pack.  Allergic rhinitis Reports chronic runny nose and deviated septum. Symptoms may be exacerbated by allergies and environmental factors. - Recommended daily use of Flonase.          Follow up plan: Return in about 4 weeks (around 08/12/2024), or if symptoms worsen or fail to improve, for Thyroid  and ADHD.  Counseling provided for all of the  vaccine components Orders Placed This Encounter  Procedures   MM 3D SCREENING MAMMOGRAM BILATERAL BREAST   ToxASSURE Select 13 (MW), Urine   Thyroid  Panel With TSH    Fonda Levins, MD [facility] 07/15/2024, 9:25 AM     "

## 2024-07-16 ENCOUNTER — Other Ambulatory Visit: Payer: Self-pay

## 2024-07-16 DIAGNOSIS — F909 Attention-deficit hyperactivity disorder, unspecified type: Secondary | ICD-10-CM

## 2024-07-16 LAB — THYROID PANEL WITH TSH
Free Thyroxine Index: 2 (ref 1.2–4.9)
T3 Uptake Ratio: 21 % — ABNORMAL LOW (ref 24–39)
T4, Total: 9.3 ug/dL (ref 4.5–12.0)
TSH: 16.4 u[IU]/mL — ABNORMAL HIGH (ref 0.450–4.500)

## 2024-07-17 ENCOUNTER — Other Ambulatory Visit: Payer: Self-pay

## 2024-07-17 ENCOUNTER — Other Ambulatory Visit (HOSPITAL_COMMUNITY): Payer: Self-pay

## 2024-07-17 MED ORDER — ADDERALL XR 20 MG PO CP24: 20.0000 mg | ORAL_CAPSULE | Freq: Every day | ORAL | 0 refills | Status: AC

## 2024-07-19 ENCOUNTER — Ambulatory Visit: Payer: Self-pay | Admitting: Family Medicine

## 2024-07-20 ENCOUNTER — Other Ambulatory Visit: Payer: Self-pay | Admitting: Family Medicine

## 2024-07-20 DIAGNOSIS — E890 Postprocedural hypothyroidism: Secondary | ICD-10-CM

## 2024-07-24 ENCOUNTER — Encounter: Payer: Self-pay | Admitting: Family Medicine

## 2024-07-24 ENCOUNTER — Ambulatory Visit

## 2024-07-24 NOTE — Progress Notes (Unsigned)
 "  Office Visit Note  Patient: Valerie Ayala             Date of Birth: [birth year]           MRN: [id number]             PCP: Dettinger, Fonda LABOR, MD Referring: Dettinger, Fonda LABOR, MD Visit Date: 08/06/2024   Subjective:  No chief complaint on file.   History of Present Illness: Valerie Ayala is a 39 y.o. female here for follow up for systemic lupus currently on treatment with Saphnelo  infusions 300 mg IV monthly and HCQ 200 mg daily.    Previous HPI 04/03/2024 Valerie Ayala is a 39 y.o. female here for follow up for systemic lupus currently on treatment with Saphnelo  infusions 300 mg IV monthly and HCQ 200 mg daily.     She experiences severe fatigue, describing it as feeling 'tired and tired and tired' with associated arthralgia in her elbows and knees. She has gained 70 pounds, complains of gain primarily in the abdominal area, and notes a 'moon face' appearance. She is concerned about the possibility of Cushing's syndrome, as her mother had similar symptoms. No recent steroid use, with the last prednisone  use about a year ago. No unusual supplements taken.   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 Her recent calcium  level was 7.4 mg/dL. She takes over-the-counter calcium  and vitamin D  supplements but feels it is insufficient. She has not seen an endocrinologist in years, and her primary care doctor manages her thyroid  medication.   Her thyroid  function was previously affected by a pharmacy error, where she was given 25 micrograms instead of the prescribed 225 micrograms of her thyroid  medication. She has been on the correct dosage for about three months now.   She has a history of ADHD and previously used Adderall  but stopped due to heart palpitations. She uses cannabis for pain management, which affects her ability to obtain Adderall .       Previous HPI 12/27/2023 Valerie Ayala is a 39 y.o. female here for follow up for  systemic lupus currently on treatment with Saphnelo  infusions 300 mg IV monthly.     She has been experiencing persistent and worsening angular cheilitis. Recent laboratory results revealed a low level of vitamin B2 (riboflavin ). She is borderline iron deficient as well.   She has been receiving infusions for lupus, which have led to partial improvement in her lab markers, specifically a decrease in her double-stranded DNA titer. Despite this, she does not feel significantly better post-infusions. She experiences unpredictable leg swelling, which has been less frequent recently. She has gained weight, although she reports eating infrequently, sometimes only once a day, and has not noticed a change in her activity level.   This morning, she experienced chest pain for the first time, located centrally, lasting about thirty minutes. She initially thought she was having a heart attack. She also reports swelling in her hands, making it difficult to remove her rings. She experiences occasional acid reflux, for which she uses Tums, but does not take regular medication for it.   She has been experiencing severe itching on her legs, arms, and back, along with dry skin. She has not noticed any particular changes in her environment that could account for the dry skin.         Previous HPI 08/08/2023 Valerie VILLAFLOR is a 39 y.o. female here for follow  up for systemic lupus currently on treatment with Saphnelo  infusions 300 mg IV monthly.     Over the past two to three months, she has experienced worsening symptoms, including persistent flu-like symptoms, fatigue, and generalized pain. Despite recent lab results from November showing improvement in lupus markers, she feels her condition is deteriorating.   She describes significant leg swelling, particularly in the left leg. This problem is recurrent, had been worse in the past but trending worse now. The swelling worsens with physical activity, such as cleaning or  walking, and is accompanied by pain. She takes vitamin D  daily but not calcium  supplements.   Severe fatigue and muscle weakness are present, with minimal physical activity, like vacuuming, resulting in prolonged pain and inability to raise her arms. Her muscles feel depleted, and she struggles with walking, often needing support to prevent falling.   She experiences frequent urination without pain or blood, feeling the need to urinate constantly with little output each time. No symptoms typical of a bladder infection are present.   She has a history of constipation but experienced diarrhea following a recent flu episode. No nausea is present, but she notes a generally dry mouth.   Persistent itching, particularly after leg swelling subsides, leads to skin damage from scratching. No recent mouth sores, but she reports dry skin and mouth, contributing to angular cheilitis.   She has been taking Lyrica  for pain management but finds it ineffective. Recently increased to 200 mg BID. She previously    She mentions a history of shingles and has received the first dose of the Shingrix  vaccine, with the second dose planned soon.     Previous HPI 05/08/2023 Valerie Ayala is a 39 y.o. female here for follow up  for systemic lupus currently on treatment with Saphnelo  infusions monthly.  She saw Dr. Urbano for management of chronic fibromyalgia and myofascial pain most recently addition of Lyrica  75 mg.  Overall she feels symptoms are generally in exacerbation.  She had a very stressful event when her son was in the hospital as a passenger in motor vehicle collision.  For several months has increased symptoms of concentration and short-term memory difficulty, unintentional weight gain, and fatigue.  Is also experiencing urinary frequency with occasional stress incontinence.  Occasional incontinence has been the case for years but the urgency is just a recent development.  She describes it as feeling like  she has a UTI but without any pain.   Previous HPI 02/02/2023 Valerie Ayala is a 39 y.o. female here for follow up for systemic lupus currently on treatment with Saphnelo  infusions monthly first dose on March 6 second infusion slightly delayed to April 16 for perioperative management.  Overall she feels better than earlier in this year but she thinks there is less benefit after the more recent Saphnelo  infusions compared to after her first 2 drug treatments.  She had 1 episode of extremely painful oral ulcers since the last visit which were a new symptom for her.  Leg swelling has been intermittent not too severe and without associated skin changes.  Worse problems currently just generalized fatigue and bodyaches with limited exertion tolerance.   Previous HPI 11/04/2022 LAIKYNN POLLIO is a 39 y.o. female here for follow up for systemic lupus currently on treatment with Saphnelo  infusions monthly first dose on March 6 second infusion slightly delayed to April 16 for perioperative management.  She reports noticing a definite change after infusion with improvement in her skin rashes  swelling though she remains very fatigued.  Reports some waning of the benefit she noticed during the last week or 2 prior to repeat infusion.  But currently doing still doing pretty well from treatment on the 16th of last month.  She has questions about naloxone that she read read about regarding management of her fibromyalgia due to persistent body aches and fatigue.   Previous HPI 08/05/22 KINSLIE HOVE is a 39 y.o. female here for follow up for systemic lupus currently off treatment due to worsening in LFTs and lack of symptom response. She continues to have joint pain and swelling in hands and feet and skin rashes.  Raynaud's symptoms are worse during wintertime so far but not having any blistering or skin peeling changes.  Not much swelling back in her legs and no new mouth or nose ulcers.  Very severe fatigue is her most  problematic symptom feels like she can sleep for 12 hours or more without having improvement in energy she has been referred for sleep study.   Previous HPI 06/02/22  TARNISHA KACHMAR is a 39 y.o. female here for follow up for SLE after last visit discontinued benlysta  due to loss of efficacy and starting methotrexate  15 mg PO weekly and folic acid  1 mg daily and continuing HCQ 300 mg daily. Also 2 week prednisone  taper for general exacerbation of symptoms at that time likely triggered by preceding URI. Since then she continues to feel considerable joint pain and stiffness. Most affected in her bilateral shoulders and knees but also with hand pain limiting some activities such as coloring. Symptoms are worst overnight and for a few hours each morning. She had at least one episode of bilateral leg swelling lasting several days.    Previous HPI 03/22/22 MUSKAN BOLLA is a 39 y.o. female here for follow up for SLE on HCQ 300 mg daily and benlysta  200 mg [place] weekly. She is currently feeling worse pretty much all over. Especially joint pains and fatigue and increased skin rashes. She had an upper respiratory illness recently she recovered since about a week ago but has other symptoms are all worse since then. Leg swelling is doing okay and no new mouth and nose ulcers.   Previous HPI 10/14/2021 OHANA BIRDWELL is a 39 y.o. female here for follow up for SLE on HCQ 300 mg daily and Benlysta  200 mg [place] weekly. At last visit significant worsening in lower extremity swelling but normal lab markers and negative for proteinuria.  Since the last visit he continues to do very poorly.  She has ongoing leg swelling itching sometimes there is redness discoloration.  Newer problem is feeling some proximal leg weakness occasional sense of her legs giving out or locking up when walking.  She is extremely fatigued almost every day.  Sometimes she cannot even get out of bed for hours and her mood is very poor due to decreased ability to do  anything.  She is also been noticing some palpitations and lightheadedness getting short of breath with very brief or mild exertion.     Previous HPI 06/15/21 MARYAN SIVAK is a 39 y.o. female here for follow up for SLE on HCQ 300 mg daily and Benlysta  200 mg [place] weekly.  She was doing very well after our last visit until about a month ago now feels everything is horrible.  She has significant bilateral leg swelling with pitting edema developing increased tenderness and painful burning sensation with discolorations and skin changes  in both legs.  She feels very fatigued.  Some skin dryness and peeling on the hands but no significant erythematous rash outside of the lower legs.  She recently also had flu with generalized body pains prescribed very large anterior cervical adenopathy.  This improved though she still has a residual ulcer in the mouth. Labs earlier this month showing very low TSH level with recent slight downward adjustment of her thyroid  replacement.  She quit smoking with use of Chantix .   Previous HPI 12/08/20 ADALEAH FORGET is a 39 y.o. female here for follow up for systemic lupus on hydroxychloroquine  300 mg p.o. daily. No signfiicant improvement since starting hydroxychloroquine  8 weeks ago.  She has not yet seen ophthalmology for retinal exam but plans to do so. Leg swelling continues to come and go, residual erythema and pain and burning sensations.  She continues to feel extremely fatigued as the most noticeable symptom.  She is also had a few mouth sores headaches and swollen glands but also describes the ongoing leg swelling sometimes with pain and discoloration.  Currently her left distal leg is most affected today.   Lupus manifestations Fatigue Arthralgias Photosensitive rash Oral ulcers Raynaud's   Lupus serology ANA 1:80 speckled dsDNA 49 Complement C3, C4 wnl Vit D 19   DMARD Hx Saphnelo  08/2022-current   No Rheumatology ROS completed.   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   Fibromyalgia 08/08/2022   ASCUS with positive high risk HPV cervical 09/15/2021   History of abnormal cervical Pap smear 09/08/2021   Swelling of lower extremity 12/08/2020   High risk medication use 12/08/2020   Hyperlipidemia 12/03/2020   Hypocalcemia 09/07/2020   Vitamin D  deficiency 09/07/2020   Systemic lupus (HCC) 08/21/2020   Dry mouth 08/21/2020   Tobacco use disorder 08/21/2020   Arthralgia 08/21/2020   Raynaud's phenomenon 08/21/2020   Major depression, recurrent 04/26/2018   Adult ADHD 12/16/2014   Asthma 05/07/2013   Hypothyroidism 11/26/2012   GAD (generalized anxiety disorder) 11/26/2012    Past Medical History:  Diagnosis Date   Anxiety    Takes xanax  twice daily   ASCUS with positive high risk HPV cervical 09/15/2021   08/2021 +HPV 16/0ther ASCUS, needs colpo, immediate risk CIN3+ 9%   Asthma    Chronic headaches    multiple times a day; they say it's related to thyroid    Complication of anesthesia    Dysrhythmia    tachycardia   Fibromyalgia    Grave's disease    H/O seasonal allergies    Headache(784.0)    most days   Pneumonia 11/02/2011   years ago   PONV (postoperative nausea and vomiting)    Systemic lupus erythematosus (HCC)    Thyroid  disease     Family History  Problem Relation Age of Onset   Hypertension Mother    Hyperlipidemia Mother    Diabetes Mother    Sleep apnea Mother    Hypertension Father    Hyperlipidemia Father    Hypothyroidism Father    Healthy Sister    Anesthesia problems Neg Hx    Hypotension Neg Hx    Malignant hyperthermia Neg Hx    Pseudochol deficiency Neg Hx    Past Surgical History:  Procedure Laterality Date   teeth pulled     back teeth   THYROIDECTOMY  11/02/2011   Procedure: THYROIDECTOMY;  Surgeon: Ida Loader, MD;  Location[facility];  Service: ENT;  Laterality: Bilateral;  TOTAL THYROIDECTOMY   TOTAL THYROIDECTOMY   11/02/2011   WISDOM TOOTH EXTRACTION  06/27/2002   Social History   Social History Narrative   Not on file   Immunization History  Administered Date(s) Administered   DTaP 08/24/1984, 11/05/1984, 12/31/1984, 12/24/1985   Hepatitis B 04/26/1996, 05/31/1996, 08/30/1996   IPV 08/24/1984, 11/05/1984, 12/24/1985   Influenza, Seasonal, Injecte, Preservative Fre 05/18/2023, 05/22/2024   Influenza,inj,Quad PF,6+ Mos 05/25/2020, 05/27/2021, 03/24/2022   Influenza-Unspecified 06/06/2011, 04/25/2012   MMR 12/24/1985   PNEUMOCOCCAL CONJUGATE-20 11/13/2023   PPD Test 03/16/2015, 03/23/2015   Pneumococcal Polysaccharide-23 11/03/2011   Tdap 12/10/2010, 07/28/2021   Zoster Recombinant(Shingrix ) 05/01/2023, 08/14/2023     Objective: Vital Signs: There were no vitals taken for this visit.   Physical Exam   Musculoskeletal Exam: ***  CDAI Exam: CDAI Score: -- Patient Global: --; Provider Global: -- Swollen: --; Tender: -- Joint Exam 08/06/2024   No joint exam has been documented for this visit   There is currently no information documented on the homunculus. Go to the Rheumatology activity and complete the homunculus joint exam.  Investigation: No additional findings.  Imaging: No results found.  Recent Labs: Lab Results  Component Value Date   WBC 10.2 05/22/2024   HGB 14.7 05/22/2024   PLT 362 05/22/2024   NA 141 05/22/2024   K 3.9 05/22/2024   CL 101 05/22/2024   CO2 21 05/22/2024   GLUCOSE 94 05/22/2024   BUN 7 05/22/2024   CREATININE 0.93 05/22/2024   BILITOT 0.5 05/22/2024   ALKPHOS 86 05/22/2024   AST 15 05/22/2024   ALT 8 05/22/2024   PROT 7.1 05/22/2024   ALBUMIN 4.5 05/22/2024   CALCIUM  8.0 (L) 05/22/2024   GFRAA 80 02/27/2020   QFTBGOLDPLUS NEGATIVE 12/08/2020    Speciality Comments: No specialty comments available.  Procedures:  No procedures performed Allergies: Patient has no known allergies.   Assessment / Plan:     Visit Diagnoses: No  diagnosis found.  ***  Orders: No orders of the defined types were placed in this encounter.  No orders of the defined types were placed in this encounter.    Follow-Up Instructions: No follow-ups on file.   Lavaris Sexson M Jonael Paradiso, CMA  Note - This record has been created using Animal nutritionist.  Chart creation errors have been sought, but may not always  have been located. Such creation errors do not reflect on  the standard of medical care. "

## 2024-07-25 ENCOUNTER — Encounter: Payer: Self-pay | Admitting: Family Medicine

## 2024-07-25 DIAGNOSIS — E890 Postprocedural hypothyroidism: Secondary | ICD-10-CM

## 2024-07-25 LAB — TOXASSURE SELECT 13 (MW), URINE

## 2024-07-25 MED ORDER — LEVOTHYROXINE SODIUM 25 MCG PO TABS: 38.0000 ug | ORAL_TABLET | Freq: Every day | ORAL | 1 refills | Status: AC

## 2024-07-29 ENCOUNTER — Ambulatory Visit

## 2024-07-29 ENCOUNTER — Other Ambulatory Visit (HOSPITAL_COMMUNITY): Payer: Self-pay

## 2024-07-30 ENCOUNTER — Other Ambulatory Visit (HOSPITAL_COMMUNITY): Payer: Self-pay

## 2024-08-01 ENCOUNTER — Other Ambulatory Visit (HOSPITAL_COMMUNITY): Payer: Self-pay

## 2024-08-02 ENCOUNTER — Ambulatory Visit

## 2024-08-02 DIAGNOSIS — Z3042 Encounter for surveillance of injectable contraceptive: Secondary | ICD-10-CM

## 2024-08-02 NOTE — Progress Notes (Signed)
 Patient is in office today for a nurse visit for Birth Control Injection. Patient Injection was given in the  Left upper quad. gluteus. Patient tolerated injection well.

## 2024-08-06 ENCOUNTER — Ambulatory Visit: Admitting: Internal Medicine

## 2024-08-06 DIAGNOSIS — M797 Fibromyalgia: Secondary | ICD-10-CM

## 2024-08-06 DIAGNOSIS — Z79899 Other long term (current) drug therapy: Secondary | ICD-10-CM

## 2024-08-06 DIAGNOSIS — M329 Systemic lupus erythematosus, unspecified: Secondary | ICD-10-CM

## 2024-08-16 ENCOUNTER — Ambulatory Visit: Admitting: Family Medicine

## 2024-10-21 ENCOUNTER — Ambulatory Visit
